# Patient Record
Sex: Female | Born: 1935 | Race: Black or African American | Hispanic: No | Smoking: Former smoker
Health system: Southern US, Community
[De-identification: ages and names within clinical notes are randomized; demographics above are authoritative.]

## PROBLEM LIST (undated history)

## (undated) DIAGNOSIS — Z87442 Personal history of urinary calculi: Secondary | ICD-10-CM

## (undated) DIAGNOSIS — Z86010 Personal history of colonic polyps: Secondary | ICD-10-CM

## (undated) DIAGNOSIS — N189 Chronic kidney disease, unspecified: Secondary | ICD-10-CM

## (undated) DIAGNOSIS — K219 Gastro-esophageal reflux disease without esophagitis: Secondary | ICD-10-CM

## (undated) DIAGNOSIS — M4800 Spinal stenosis, site unspecified: Secondary | ICD-10-CM

## (undated) DIAGNOSIS — R0989 Other specified symptoms and signs involving the circulatory and respiratory systems: Secondary | ICD-10-CM

## (undated) DIAGNOSIS — I739 Peripheral vascular disease, unspecified: Secondary | ICD-10-CM

## (undated) DIAGNOSIS — M25473 Effusion, unspecified ankle: Secondary | ICD-10-CM

## (undated) DIAGNOSIS — I2510 Atherosclerotic heart disease of native coronary artery without angina pectoris: Secondary | ICD-10-CM

## (undated) DIAGNOSIS — I4891 Unspecified atrial fibrillation: Secondary | ICD-10-CM

## (undated) DIAGNOSIS — K5790 Diverticulosis of intestine, part unspecified, without perforation or abscess without bleeding: Secondary | ICD-10-CM

## (undated) DIAGNOSIS — D649 Anemia, unspecified: Secondary | ICD-10-CM

## (undated) DIAGNOSIS — E209 Hypoparathyroidism, unspecified: Secondary | ICD-10-CM

## (undated) DIAGNOSIS — E7800 Pure hypercholesterolemia, unspecified: Secondary | ICD-10-CM

## (undated) DIAGNOSIS — I10 Essential (primary) hypertension: Secondary | ICD-10-CM

## (undated) DIAGNOSIS — Z9289 Personal history of other medical treatment: Secondary | ICD-10-CM

## (undated) HISTORY — DX: Other specified symptoms and signs involving the circulatory and respiratory systems: R09.89

## (undated) HISTORY — DX: Spinal stenosis, site unspecified: M48.00

## (undated) HISTORY — DX: Unspecified atrial fibrillation: I48.91

## (undated) HISTORY — PX: KIDNEY STONE SURGERY: SHX686

## (undated) HISTORY — DX: Atherosclerotic heart disease of native coronary artery without angina pectoris: I25.10

## (undated) HISTORY — DX: Effusion, unspecified ankle: M25.473

## (undated) HISTORY — PX: ABDOMINAL HYSTERECTOMY: SHX81

## (undated) HISTORY — PX: COLONOSCOPY: SHX174

## (undated) HISTORY — DX: Essential (primary) hypertension: I10

## (undated) HISTORY — DX: Gastro-esophageal reflux disease without esophagitis: K21.9

## (undated) HISTORY — DX: Diverticulosis of intestine, part unspecified, without perforation or abscess without bleeding: K57.90

## (undated) HISTORY — DX: Pure hypercholesterolemia, unspecified: E78.00

## (undated) HISTORY — PX: CATARACT EXTRACTION, BILATERAL: SHX1313

## (undated) HISTORY — DX: Personal history of colonic polyps: Z86.010

## (undated) HISTORY — DX: Chronic kidney disease, unspecified: N18.9

## (undated) HISTORY — DX: Hypoparathyroidism, unspecified: E20.9

## (undated) HISTORY — PX: POLYPECTOMY: SHX149

## (undated) HISTORY — DX: Anemia, unspecified: D64.9

## (undated) HISTORY — PX: APPENDECTOMY: SHX54

---

## 1998-12-03 ENCOUNTER — Emergency Department (HOSPITAL_COMMUNITY): Admission: EM | Admit: 1998-12-03 | Discharge: 1998-12-03 | Payer: Self-pay | Admitting: Emergency Medicine

## 2001-05-01 ENCOUNTER — Encounter: Payer: Self-pay | Admitting: Gastroenterology

## 2001-05-01 DIAGNOSIS — Z860100 Personal history of colon polyps, unspecified: Secondary | ICD-10-CM

## 2001-05-01 HISTORY — DX: Personal history of colonic polyps: Z86.010

## 2001-05-01 HISTORY — DX: Personal history of colon polyps, unspecified: Z86.0100

## 2002-07-31 ENCOUNTER — Encounter: Payer: Self-pay | Admitting: Internal Medicine

## 2002-07-31 ENCOUNTER — Encounter: Admission: RE | Admit: 2002-07-31 | Discharge: 2002-07-31 | Payer: Self-pay | Admitting: Internal Medicine

## 2002-12-31 ENCOUNTER — Encounter: Admission: RE | Admit: 2002-12-31 | Discharge: 2002-12-31 | Payer: Self-pay | Admitting: Cardiology

## 2002-12-31 ENCOUNTER — Encounter: Payer: Self-pay | Admitting: Cardiology

## 2003-01-04 ENCOUNTER — Ambulatory Visit (HOSPITAL_COMMUNITY): Admission: RE | Admit: 2003-01-04 | Discharge: 2003-01-04 | Payer: Self-pay | Admitting: Cardiology

## 2003-01-04 HISTORY — PX: CARDIOVERSION: SHX1299

## 2004-03-31 HISTORY — PX: TRANSTHORACIC ECHOCARDIOGRAM: SHX275

## 2004-06-19 ENCOUNTER — Ambulatory Visit: Payer: Self-pay | Admitting: Gastroenterology

## 2004-07-10 ENCOUNTER — Ambulatory Visit: Payer: Self-pay | Admitting: Gastroenterology

## 2008-02-08 ENCOUNTER — Inpatient Hospital Stay (HOSPITAL_COMMUNITY): Admission: EM | Admit: 2008-02-08 | Discharge: 2008-02-12 | Payer: Self-pay | Admitting: Emergency Medicine

## 2008-02-08 ENCOUNTER — Ambulatory Visit: Payer: Self-pay | Admitting: Internal Medicine

## 2008-04-08 ENCOUNTER — Encounter: Payer: Self-pay | Admitting: Gastroenterology

## 2008-07-15 ENCOUNTER — Encounter: Payer: Self-pay | Admitting: Gastroenterology

## 2008-08-08 DIAGNOSIS — I6529 Occlusion and stenosis of unspecified carotid artery: Secondary | ICD-10-CM | POA: Insufficient documentation

## 2008-08-08 DIAGNOSIS — Z87442 Personal history of urinary calculi: Secondary | ICD-10-CM | POA: Insufficient documentation

## 2008-08-08 DIAGNOSIS — Z860100 Personal history of colon polyps, unspecified: Secondary | ICD-10-CM | POA: Insufficient documentation

## 2008-08-08 DIAGNOSIS — M4800 Spinal stenosis, site unspecified: Secondary | ICD-10-CM | POA: Insufficient documentation

## 2008-08-08 DIAGNOSIS — I4891 Unspecified atrial fibrillation: Secondary | ICD-10-CM

## 2008-08-08 DIAGNOSIS — E785 Hyperlipidemia, unspecified: Secondary | ICD-10-CM

## 2008-08-08 DIAGNOSIS — I10 Essential (primary) hypertension: Secondary | ICD-10-CM | POA: Insufficient documentation

## 2008-08-08 DIAGNOSIS — Z862 Personal history of diseases of the blood and blood-forming organs and certain disorders involving the immune mechanism: Secondary | ICD-10-CM | POA: Insufficient documentation

## 2008-08-08 DIAGNOSIS — Z8639 Personal history of other endocrine, nutritional and metabolic disease: Secondary | ICD-10-CM

## 2008-08-08 DIAGNOSIS — K922 Gastrointestinal hemorrhage, unspecified: Secondary | ICD-10-CM | POA: Insufficient documentation

## 2008-08-08 DIAGNOSIS — K449 Diaphragmatic hernia without obstruction or gangrene: Secondary | ICD-10-CM | POA: Insufficient documentation

## 2008-08-08 DIAGNOSIS — M810 Age-related osteoporosis without current pathological fracture: Secondary | ICD-10-CM | POA: Insufficient documentation

## 2008-08-08 DIAGNOSIS — E559 Vitamin D deficiency, unspecified: Secondary | ICD-10-CM | POA: Insufficient documentation

## 2008-08-08 DIAGNOSIS — N289 Disorder of kidney and ureter, unspecified: Secondary | ICD-10-CM | POA: Insufficient documentation

## 2008-08-08 DIAGNOSIS — K219 Gastro-esophageal reflux disease without esophagitis: Secondary | ICD-10-CM

## 2008-08-08 DIAGNOSIS — K5730 Diverticulosis of large intestine without perforation or abscess without bleeding: Secondary | ICD-10-CM | POA: Insufficient documentation

## 2008-08-09 ENCOUNTER — Ambulatory Visit: Payer: Self-pay | Admitting: Gastroenterology

## 2008-08-09 DIAGNOSIS — R195 Other fecal abnormalities: Secondary | ICD-10-CM

## 2008-08-09 DIAGNOSIS — D649 Anemia, unspecified: Secondary | ICD-10-CM | POA: Insufficient documentation

## 2008-08-09 LAB — CONVERTED CEMR LAB
Basophils Relative: 0.9 % (ref 0.0–3.0)
Eosinophils Absolute: 0.1 10*3/uL (ref 0.0–0.7)
Eosinophils Relative: 2.4 % (ref 0.0–5.0)
Folate: >20 ng/mL
HCT: 28.5 % — ABNORMAL LOW (ref 36.0–46.0)
Hemoglobin: 9.6 g/dL — ABNORMAL LOW (ref 12.0–15.0)
MCV: 86.6 fL (ref 78.0–100.0)
Monocytes Absolute: 0.4 10*3/uL (ref 0.1–1.0)
Monocytes Relative: 7.9 % (ref 3.0–12.0)
Neutro Abs: 3 10*3/uL (ref 1.4–7.7)
Vitamin B-12: 532 pg/mL (ref 211–911)
WBC: 4.7 10*3/uL (ref 4.5–10.5)

## 2008-09-02 ENCOUNTER — Ambulatory Visit: Payer: Self-pay | Admitting: Gastroenterology

## 2008-09-02 ENCOUNTER — Encounter: Payer: Self-pay | Admitting: Gastroenterology

## 2008-09-04 ENCOUNTER — Encounter: Payer: Self-pay | Admitting: Gastroenterology

## 2008-11-18 ENCOUNTER — Telehealth: Payer: Self-pay | Admitting: Gastroenterology

## 2008-11-22 ENCOUNTER — Ambulatory Visit: Payer: Self-pay | Admitting: Hematology and Oncology

## 2008-11-27 ENCOUNTER — Encounter: Payer: Self-pay | Admitting: Gastroenterology

## 2008-11-28 ENCOUNTER — Encounter: Payer: Self-pay | Admitting: Gastroenterology

## 2008-11-28 LAB — CBC & DIFF AND RETIC
Basophils Absolute: 0.1 10*3/uL (ref 0.0–0.1)
Eosinophils Absolute: 0.1 10*3/uL (ref 0.0–0.5)
HCT: 28 % — ABNORMAL LOW (ref 34.8–46.6)
HGB: 9.1 g/dL — ABNORMAL LOW (ref 11.6–15.9)
MCH: 26.5 pg (ref 25.1–34.0)
MCV: 81.6 fL (ref 79.5–101.0)
NEUT#: 4.1 10*3/uL (ref 1.5–6.5)
NEUT%: 64.8 % (ref 38.4–76.8)
RDW: 16.2 % — ABNORMAL HIGH (ref 11.2–14.5)
Retic %: 3.67 % — ABNORMAL HIGH (ref 0.50–1.50)
Retic Ct Abs: 125.88 10*3/uL — ABNORMAL HIGH (ref 18.30–72.70)
lymph#: 1.3 10*3/uL (ref 0.9–3.3)

## 2008-11-28 LAB — MORPHOLOGY

## 2008-11-28 LAB — URINALYSIS, MICROSCOPIC - CHCC
Blood: NEGATIVE
Ketones: NEGATIVE mg/dL
Nitrite: NEGATIVE
Protein: <30 mg/dL
Specific Gravity, Urine: 1.015 (ref 1.003–1.035)

## 2008-12-03 LAB — DIRECT ANTIGLOBULIN TEST (NOT AT ARMC)
DAT (Complement): NEGATIVE
DAT IgG: NEGATIVE

## 2008-12-03 LAB — COMPREHENSIVE METABOLIC PANEL
ALT: 17 U/L (ref 0–35)
AST: 25 U/L (ref 0–37)
Albumin: 4.2 g/dL (ref 3.5–5.2)
Alkaline Phosphatase: 60 U/L (ref 39–117)
Calcium: 10.6 mg/dL — ABNORMAL HIGH (ref 8.4–10.5)
Chloride: 108 mEq/L (ref 96–112)
Potassium: 4.5 mEq/L (ref 3.5–5.3)
Sodium: 141 mEq/L (ref 135–145)
Total Protein: 7.2 g/dL (ref 6.0–8.3)

## 2008-12-03 LAB — PROTEIN ELECTROPHORESIS, SERUM, WITH REFLEX
Beta Globulin: 6.9 % (ref 4.7–7.2)
Total Protein, Serum Electrophoresis: 7.2 g/dL (ref 6.0–8.3)

## 2008-12-03 LAB — HAPTOGLOBIN: Haptoglobin: 187 mg/dL (ref 16–200)

## 2008-12-03 LAB — VITAMIN B12: Vitamin B-12: 1395 pg/mL — ABNORMAL HIGH (ref 211–911)

## 2008-12-09 LAB — HGB ELECTROPHORESIS
Hemoglobin Evaluation: ABNORMAL
Hgb Other: 0 %

## 2008-12-09 LAB — HEMOGLOBINOPATHY EVALUATION
Hgb A2 Quant: 1.6 % — ABNORMAL LOW (ref 2.2–3.2)
Hgb A: 67.6 % — ABNORMAL LOW (ref 96.8–97.8)
Hgb F Quant: 0 % (ref 0.0–2.0)

## 2008-12-13 ENCOUNTER — Encounter (INDEPENDENT_AMBULATORY_CARE_PROVIDER_SITE_OTHER): Payer: Self-pay | Admitting: Interventional Radiology

## 2008-12-13 ENCOUNTER — Ambulatory Visit (HOSPITAL_COMMUNITY): Admission: RE | Admit: 2008-12-13 | Discharge: 2008-12-13 | Payer: Self-pay | Admitting: Hematology and Oncology

## 2008-12-19 ENCOUNTER — Encounter: Payer: Self-pay | Admitting: Gastroenterology

## 2009-01-06 ENCOUNTER — Ambulatory Visit: Payer: Self-pay | Admitting: Hematology and Oncology

## 2009-01-08 LAB — CBC WITH DIFFERENTIAL/PLATELET
Eosinophils Absolute: 0.2 10*3/uL (ref 0.0–0.5)
HCT: 34.8 % (ref 34.8–46.6)
HGB: 11.8 g/dL (ref 11.6–15.9)
LYMPH%: 28.8 % (ref 14.0–49.7)
MONO#: 0.7 10*3/uL (ref 0.1–0.9)
NEUT#: 3.3 10*3/uL (ref 1.5–6.5)
Platelets: 153 10*3/uL (ref 145–400)
RBC: 4.52 10*6/uL (ref 3.70–5.45)
WBC: 5.9 10*3/uL (ref 3.9–10.3)
nRBC: 0 % (ref 0–0)

## 2009-01-29 LAB — CBC WITH DIFFERENTIAL/PLATELET
BASO%: 1.6 % (ref 0.0–2.0)
EOS%: 2.1 % (ref 0.0–7.0)
MCH: 27.4 pg (ref 25.1–34.0)
MCHC: 33.4 g/dL (ref 31.5–36.0)
MONO#: 0.9 10*3/uL (ref 0.1–0.9)
RDW: 15.9 % — ABNORMAL HIGH (ref 11.2–14.5)
WBC: 6.3 10*3/uL (ref 3.9–10.3)
lymph#: 1.5 10*3/uL (ref 0.9–3.3)

## 2009-02-17 ENCOUNTER — Ambulatory Visit: Payer: Self-pay | Admitting: Internal Medicine

## 2009-02-19 LAB — CBC WITH DIFFERENTIAL/PLATELET
Basophils Absolute: 0.1 10*3/uL (ref 0.0–0.1)
EOS%: 2.6 % (ref 0.0–7.0)
Eosinophils Absolute: 0.2 10*3/uL (ref 0.0–0.5)
HGB: 13 g/dL (ref 11.6–15.9)
MONO#: 0.9 10*3/uL (ref 0.1–0.9)
NEUT#: 3.3 10*3/uL (ref 1.5–6.5)
RDW: 14.8 % — ABNORMAL HIGH (ref 11.2–14.5)
WBC: 5.9 10*3/uL (ref 3.9–10.3)
lymph#: 1.5 10*3/uL (ref 0.9–3.3)

## 2009-03-03 ENCOUNTER — Encounter: Payer: Self-pay | Admitting: Gastroenterology

## 2009-03-12 LAB — CBC WITH DIFFERENTIAL/PLATELET
BASO%: 1.1 % (ref 0.0–2.0)
LYMPH%: 36.8 % (ref 14.0–49.7)
MCHC: 33.8 g/dL (ref 31.5–36.0)
MCV: 79.6 fL (ref 79.5–101.0)
MONO#: 0.7 10*3/uL (ref 0.1–0.9)
MONO%: 13.4 % (ref 0.0–14.0)
NEUT#: 2.6 10*3/uL (ref 1.5–6.5)
Platelets: 125 10*3/uL — ABNORMAL LOW (ref 145–400)
RBC: 4.57 10*6/uL (ref 3.70–5.45)
RDW: 14.3 % (ref 11.2–14.5)
WBC: 5.4 10*3/uL (ref 3.9–10.3)
nRBC: 0 % (ref 0–0)

## 2009-03-31 ENCOUNTER — Ambulatory Visit: Payer: Self-pay | Admitting: Internal Medicine

## 2009-04-02 ENCOUNTER — Encounter: Payer: Self-pay | Admitting: Gastroenterology

## 2009-04-02 LAB — CBC WITH DIFFERENTIAL/PLATELET
Basophils Absolute: 0.1 10*3/uL (ref 0.0–0.1)
Eosinophils Absolute: 0.1 10*3/uL (ref 0.0–0.5)
HCT: 36.1 % (ref 34.8–46.6)
HGB: 12.1 g/dL (ref 11.6–15.9)
LYMPH%: 26.7 % (ref 14.0–49.7)
MCV: 83.9 fL (ref 79.5–101.0)
MONO#: 0.7 10*3/uL (ref 0.1–0.9)
MONO%: 11.2 % (ref 0.0–14.0)
NEUT#: 3.6 10*3/uL (ref 1.5–6.5)
NEUT%: 59.2 % (ref 38.4–76.8)
Platelets: 150 10*3/uL (ref 145–400)
WBC: 6.1 10*3/uL (ref 3.9–10.3)

## 2009-04-03 LAB — BASIC METABOLIC PANEL
BUN: 28 mg/dL — ABNORMAL HIGH (ref 6–23)
CO2: 26 mEq/L (ref 19–32)
Calcium: 10.7 mg/dL — ABNORMAL HIGH (ref 8.4–10.5)
Glucose, Bld: 106 mg/dL — ABNORMAL HIGH (ref 70–99)
Sodium: 140 mEq/L (ref 135–145)

## 2009-04-03 LAB — FERRITIN: Ferritin: 150 ng/mL (ref 10–291)

## 2009-04-03 LAB — IRON AND TIBC: TIBC: 319 ug/dL (ref 250–470)

## 2009-04-03 LAB — IGG, IGA, IGM
IgG (Immunoglobin G), Serum: 1160 mg/dL (ref 694–1618)
IgM, Serum: 48 mg/dL — ABNORMAL LOW (ref 60–263)

## 2009-04-03 LAB — KAPPA/LAMBDA LIGHT CHAINS
Kappa:Lambda Ratio: 0.95 (ref 0.26–1.65)
Lambda Free Lght Chn: 1.55 mg/dL (ref 0.57–2.63)

## 2009-04-23 ENCOUNTER — Encounter (HOSPITAL_COMMUNITY): Admission: RE | Admit: 2009-04-23 | Discharge: 2009-05-29 | Payer: Self-pay | Admitting: Internal Medicine

## 2009-04-23 ENCOUNTER — Encounter: Payer: Self-pay | Admitting: Gastroenterology

## 2009-04-23 LAB — CBC WITH DIFFERENTIAL/PLATELET
BASO%: 0.8 % (ref 0.0–2.0)
EOS%: 1.2 % (ref 0.0–7.0)
HCT: 22.3 % — ABNORMAL LOW (ref 34.8–46.6)
LYMPH%: 18.6 % (ref 14.0–49.7)
MCH: 28 pg (ref 25.1–34.0)
MCHC: 32.4 g/dL (ref 31.5–36.0)
NEUT%: 68.9 % (ref 38.4–76.8)
Platelets: 208 10*3/uL (ref 145–400)
RBC: 2.58 10*6/uL — ABNORMAL LOW (ref 3.70–5.45)
lymph#: 1.5 10*3/uL (ref 0.9–3.3)

## 2009-04-23 LAB — TYPE & CROSSMATCH - CHCC

## 2009-04-23 LAB — IRON AND TIBC: Iron: 20 ug/dL — ABNORMAL LOW (ref 42–145)

## 2009-05-02 ENCOUNTER — Ambulatory Visit: Payer: Self-pay | Admitting: Gastroenterology

## 2009-05-02 DIAGNOSIS — N764 Abscess of vulva: Secondary | ICD-10-CM

## 2009-05-12 ENCOUNTER — Ambulatory Visit: Payer: Self-pay | Admitting: Internal Medicine

## 2009-05-14 LAB — CBC WITH DIFFERENTIAL/PLATELET
Basophils Absolute: 0 10*3/uL (ref 0.0–0.1)
EOS%: 0.3 % (ref 0.0–7.0)
Eosinophils Absolute: 0 10*3/uL (ref 0.0–0.5)
HCT: 38.7 % (ref 34.8–46.6)
HGB: 12.9 g/dL (ref 11.6–15.9)
MCH: 26.9 pg (ref 25.1–34.0)
NEUT#: 7.3 10*3/uL — ABNORMAL HIGH (ref 1.5–6.5)
NEUT%: 74.1 % (ref 38.4–76.8)
RDW: 15.8 % — ABNORMAL HIGH (ref 11.2–14.5)
lymph#: 1.2 10*3/uL (ref 0.9–3.3)

## 2009-05-16 ENCOUNTER — Ambulatory Visit: Payer: Self-pay | Admitting: Gastroenterology

## 2009-05-16 DIAGNOSIS — IMO0002 Reserved for concepts with insufficient information to code with codable children: Secondary | ICD-10-CM

## 2009-05-16 DIAGNOSIS — M1710 Unilateral primary osteoarthritis, unspecified knee: Secondary | ICD-10-CM | POA: Insufficient documentation

## 2009-06-04 LAB — CBC WITH DIFFERENTIAL/PLATELET
BASO%: 0.6 % (ref 0.0–2.0)
HCT: 27.8 % — ABNORMAL LOW (ref 34.8–46.6)
LYMPH%: 15.5 % (ref 14.0–49.7)
MCHC: 32.7 g/dL (ref 31.5–36.0)
MCV: 79.7 fL (ref 79.5–101.0)
MONO#: 1.2 10*3/uL — ABNORMAL HIGH (ref 0.1–0.9)
MONO%: 11.6 % (ref 0.0–14.0)
NEUT%: 70.2 % (ref 38.4–76.8)
Platelets: 249 10*3/uL (ref 145–400)
WBC: 10.2 10*3/uL (ref 3.9–10.3)

## 2009-06-24 ENCOUNTER — Telehealth: Payer: Self-pay | Admitting: Gastroenterology

## 2009-06-25 LAB — CONVERTED CEMR LAB
OCCULT 1: NEGATIVE
OCCULT 2: NEGATIVE
OCCULT 3: NEGATIVE

## 2009-07-03 ENCOUNTER — Encounter: Payer: Self-pay | Admitting: Gastroenterology

## 2009-07-03 ENCOUNTER — Ambulatory Visit: Payer: Self-pay | Admitting: Internal Medicine

## 2009-07-03 LAB — CBC WITH DIFFERENTIAL/PLATELET
Basophils Absolute: 0.1 10*3/uL (ref 0.0–0.1)
EOS%: 1.2 % (ref 0.0–7.0)
Eosinophils Absolute: 0.1 10*3/uL (ref 0.0–0.5)
HGB: 12.9 g/dL (ref 11.6–15.9)
MCH: 25.6 pg (ref 25.1–34.0)
MONO%: 13.1 % (ref 0.0–14.0)
NEUT#: 3.5 10*3/uL (ref 1.5–6.5)
RBC: 5.03 10*6/uL (ref 3.70–5.45)
RDW: 16.1 % — ABNORMAL HIGH (ref 11.2–14.5)
lymph#: 1.5 10*3/uL (ref 0.9–3.3)
nRBC: 0 % (ref 0–0)

## 2009-07-03 LAB — LACTATE DEHYDROGENASE: LDH: 181 U/L (ref 94–250)

## 2009-07-16 LAB — CBC WITH DIFFERENTIAL/PLATELET
BASO%: 1.4 % (ref 0.0–2.0)
EOS%: 2.2 % (ref 0.0–7.0)
HCT: 37.6 % (ref 34.8–46.6)
MCH: 25.7 pg (ref 25.1–34.0)
MCHC: 33.5 g/dL (ref 31.5–36.0)
MONO#: 0.7 10*3/uL (ref 0.1–0.9)
NEUT%: 55.2 % (ref 38.4–76.8)
RDW: 16.2 % — ABNORMAL HIGH (ref 11.2–14.5)
WBC: 5.8 10*3/uL (ref 3.9–10.3)
lymph#: 1.7 10*3/uL (ref 0.9–3.3)
nRBC: 0 % (ref 0–0)

## 2009-08-04 ENCOUNTER — Ambulatory Visit: Payer: Self-pay | Admitting: Internal Medicine

## 2009-08-06 LAB — CBC WITH DIFFERENTIAL/PLATELET
BASO%: 0.9 % (ref 0.0–2.0)
EOS%: 2 % (ref 0.0–7.0)
LYMPH%: 27.1 % (ref 14.0–49.7)
MCH: 25.9 pg (ref 25.1–34.0)
MCHC: 33.3 g/dL (ref 31.5–36.0)
MCV: 77.7 fL — ABNORMAL LOW (ref 79.5–101.0)
MONO#: 0.8 10*3/uL (ref 0.1–0.9)
MONO%: 11.6 % (ref 0.0–14.0)
NEUT%: 58.4 % (ref 38.4–76.8)
Platelets: 146 10*3/uL (ref 145–400)
RBC: 4.79 10*6/uL (ref 3.70–5.45)
WBC: 6.5 10*3/uL (ref 3.9–10.3)
nRBC: 0 % (ref 0–0)

## 2009-08-27 LAB — CBC WITH DIFFERENTIAL/PLATELET
BASO%: 0.8 % (ref 0.0–2.0)
EOS%: 1.6 % (ref 0.0–7.0)
MCH: 26.4 pg (ref 25.1–34.0)
MCHC: 33.6 g/dL (ref 31.5–36.0)
MCV: 78.6 fL — ABNORMAL LOW (ref 79.5–101.0)
MONO%: 12.7 % (ref 0.0–14.0)
RDW: 15.4 % — ABNORMAL HIGH (ref 11.2–14.5)
lymph#: 2 10*3/uL (ref 0.9–3.3)

## 2009-09-15 ENCOUNTER — Ambulatory Visit: Payer: Self-pay | Admitting: Internal Medicine

## 2009-09-17 LAB — CBC WITH DIFFERENTIAL/PLATELET
BASO%: 1.5 % (ref 0.0–2.0)
EOS%: 1.3 % (ref 0.0–7.0)
MCH: 27.1 pg (ref 25.1–34.0)
MCHC: 34.1 g/dL (ref 31.5–36.0)
RDW: 14.3 % (ref 11.2–14.5)
lymph#: 1.9 10*3/uL (ref 0.9–3.3)

## 2009-09-24 LAB — IRON AND TIBC
%SAT: 7 % — ABNORMAL LOW (ref 20–55)
TIBC: 317 ug/dL (ref 250–470)
UIBC: 296 ug/dL

## 2009-09-24 LAB — BASIC METABOLIC PANEL
CO2: 24 mEq/L (ref 19–32)
Calcium: 11 mg/dL — ABNORMAL HIGH (ref 8.4–10.5)
Sodium: 140 mEq/L (ref 135–145)

## 2009-09-24 LAB — CBC WITH DIFFERENTIAL/PLATELET
BASO%: 0.8 % (ref 0.0–2.0)
Eosinophils Absolute: 0.1 10*3/uL (ref 0.0–0.5)
MCV: 80.7 fL (ref 79.5–101.0)
MONO#: 1 10*3/uL — ABNORMAL HIGH (ref 0.1–0.9)
MONO%: 15.4 % — ABNORMAL HIGH (ref 0.0–14.0)
NEUT#: 3.3 10*3/uL (ref 1.5–6.5)
RBC: 3.47 10*6/uL — ABNORMAL LOW (ref 3.70–5.45)
RDW: 14.6 % — ABNORMAL HIGH (ref 11.2–14.5)
WBC: 6.3 10*3/uL (ref 3.9–10.3)
nRBC: 0 % (ref 0–0)

## 2009-10-01 ENCOUNTER — Encounter: Payer: Self-pay | Admitting: Gastroenterology

## 2009-10-08 LAB — CBC WITH DIFFERENTIAL/PLATELET
BASO%: 1.4 % (ref 0.0–2.0)
EOS%: 1.9 % (ref 0.0–7.0)
HCT: 31.5 % — ABNORMAL LOW (ref 34.8–46.6)
LYMPH%: 29.9 % (ref 14.0–49.7)
MCH: 25.2 pg (ref 25.1–34.0)
MCHC: 33 g/dL (ref 31.5–36.0)
NEUT%: 53 % (ref 38.4–76.8)
Platelets: 151 10*3/uL (ref 145–400)
lymph#: 1.7 10*3/uL (ref 0.9–3.3)

## 2009-10-28 ENCOUNTER — Ambulatory Visit: Payer: Self-pay | Admitting: Internal Medicine

## 2009-10-29 LAB — CBC WITH DIFFERENTIAL/PLATELET
Basophils Absolute: 0.1 10*3/uL (ref 0.0–0.1)
EOS%: 1.1 % (ref 0.0–7.0)
HCT: 33.7 % — ABNORMAL LOW (ref 34.8–46.6)
HGB: 11.1 g/dL — ABNORMAL LOW (ref 11.6–15.9)
LYMPH%: 28.6 % (ref 14.0–49.7)
MCH: 23.9 pg — ABNORMAL LOW (ref 25.1–34.0)
MCV: 72.6 fL — ABNORMAL LOW (ref 79.5–101.0)
MONO%: 11.2 % (ref 0.0–14.0)
NEUT%: 57.8 % (ref 38.4–76.8)
Platelets: 182 10*3/uL (ref 145–400)
lymph#: 1.6 10*3/uL (ref 0.9–3.3)

## 2009-11-19 LAB — CBC WITH DIFFERENTIAL/PLATELET
Basophils Absolute: 0.1 10*3/uL (ref 0.0–0.1)
EOS%: 1.3 % (ref 0.0–7.0)
HGB: 11.9 g/dL (ref 11.6–15.9)
MCH: 23.1 pg — ABNORMAL LOW (ref 25.1–34.0)
MCHC: 33.2 g/dL (ref 31.5–36.0)
MCV: 69.5 fL — ABNORMAL LOW (ref 79.5–101.0)
MONO%: 11.7 % (ref 0.0–14.0)
RDW: 18.8 % — ABNORMAL HIGH (ref 11.2–14.5)

## 2009-11-19 LAB — TECHNOLOGIST REVIEW

## 2009-12-08 ENCOUNTER — Ambulatory Visit: Payer: Self-pay | Admitting: Internal Medicine

## 2009-12-10 LAB — CBC WITH DIFFERENTIAL/PLATELET
Basophils Absolute: 0.1 10*3/uL (ref 0.0–0.1)
Eosinophils Absolute: 0.1 10*3/uL (ref 0.0–0.5)
HCT: 35.5 % (ref 34.8–46.6)
LYMPH%: 28.7 % (ref 14.0–49.7)
MCV: 68.4 fL — ABNORMAL LOW (ref 79.5–101.0)
MONO#: 0.8 10*3/uL (ref 0.1–0.9)
MONO%: 15.1 % — ABNORMAL HIGH (ref 0.0–14.0)
NEUT#: 2.8 10*3/uL (ref 1.5–6.5)
NEUT%: 51.4 % (ref 38.4–76.8)
Platelets: 159 10*3/uL (ref 145–400)
WBC: 5.4 10*3/uL (ref 3.9–10.3)

## 2009-12-15 ENCOUNTER — Encounter: Payer: Self-pay | Admitting: Gastroenterology

## 2010-01-19 ENCOUNTER — Ambulatory Visit: Payer: Self-pay | Admitting: Internal Medicine

## 2010-01-21 LAB — CBC WITH DIFFERENTIAL/PLATELET
Basophils Absolute: 0.1 10*3/uL (ref 0.0–0.1)
EOS%: 1.3 % (ref 0.0–7.0)
MCH: 24.2 pg — ABNORMAL LOW (ref 25.1–34.0)
MCV: 71.8 fL — ABNORMAL LOW (ref 79.5–101.0)
MONO%: 13.9 % (ref 0.0–14.0)
RBC: 4.76 10*6/uL (ref 3.70–5.45)
RDW: 23 % — ABNORMAL HIGH (ref 11.2–14.5)
nRBC: 0 % (ref 0–0)

## 2010-01-21 LAB — COMPREHENSIVE METABOLIC PANEL
Alkaline Phosphatase: 62 U/L (ref 39–117)
Glucose, Bld: 96 mg/dL (ref 70–99)
Sodium: 140 mEq/L (ref 135–145)
Total Bilirubin: 1.4 mg/dL — ABNORMAL HIGH (ref 0.3–1.2)
Total Protein: 6.6 g/dL (ref 6.0–8.3)

## 2010-01-21 LAB — IRON AND TIBC
%SAT: 36 % (ref 20–55)
TIBC: 374 ug/dL (ref 250–470)
UIBC: 238 ug/dL

## 2010-02-23 ENCOUNTER — Ambulatory Visit: Payer: Self-pay | Admitting: Internal Medicine

## 2010-02-25 ENCOUNTER — Encounter: Payer: Self-pay | Admitting: Gastroenterology

## 2010-02-25 LAB — CBC WITH DIFFERENTIAL/PLATELET
BASO%: 1.3 % (ref 0.0–2.0)
EOS%: 1.3 % (ref 0.0–7.0)
HCT: 34.3 % — ABNORMAL LOW (ref 34.8–46.6)
LYMPH%: 31.5 % (ref 14.0–49.7)
MCH: 24.6 pg — ABNORMAL LOW (ref 25.1–34.0)
MCHC: 33.8 g/dL (ref 31.5–36.0)
NEUT%: 53.9 % (ref 38.4–76.8)
Platelets: 145 10*3/uL (ref 145–400)

## 2010-04-29 ENCOUNTER — Other Ambulatory Visit: Payer: Self-pay | Admitting: Hematology and Oncology

## 2010-04-29 ENCOUNTER — Ambulatory Visit (HOSPITAL_COMMUNITY)
Admission: RE | Admit: 2010-04-29 | Discharge: 2010-04-29 | Payer: Self-pay | Source: Home / Self Care | Admitting: Internal Medicine

## 2010-04-29 LAB — CBC WITH DIFFERENTIAL/PLATELET
Basophils Absolute: 0.1 10*3/uL (ref 0.0–0.1)
Eosinophils Absolute: 0.1 10*3/uL (ref 0.0–0.5)
HGB: 12.7 g/dL (ref 11.6–15.9)
MONO#: 0.9 10*3/uL (ref 0.1–0.9)
NEUT#: 3.9 10*3/uL (ref 1.5–6.5)
Platelets: 149 10*3/uL (ref 145–400)
RBC: 4.89 10*6/uL (ref 3.70–5.45)
RDW: 16.5 % — ABNORMAL HIGH (ref 11.2–14.5)
WBC: 7.4 10*3/uL (ref 3.9–10.3)
nRBC: 0 % (ref 0–0)

## 2010-05-19 ENCOUNTER — Ambulatory Visit (HOSPITAL_BASED_OUTPATIENT_CLINIC_OR_DEPARTMENT_OTHER): Payer: MEDICARE | Admitting: Internal Medicine

## 2010-05-20 LAB — CBC WITH DIFFERENTIAL/PLATELET
Basophils Absolute: 0.1 10*3/uL (ref 0.0–0.1)
Eosinophils Absolute: 0.1 10*3/uL (ref 0.0–0.5)
HGB: 12.1 g/dL (ref 11.6–15.9)
MONO#: 0.9 10*3/uL (ref 0.1–0.9)
MONO%: 11.9 % (ref 0.0–14.0)
NEUT#: 4.3 10*3/uL (ref 1.5–6.5)
RBC: 4.58 10*6/uL (ref 3.70–5.45)
RDW: 15.8 % — ABNORMAL HIGH (ref 11.2–14.5)
WBC: 7.2 10*3/uL (ref 3.9–10.3)
lymph#: 1.9 10*3/uL (ref 0.9–3.3)
nRBC: 0 % (ref 0–0)

## 2010-06-10 LAB — CBC WITH DIFFERENTIAL/PLATELET
BASO%: 1.1 % (ref 0.0–2.0)
Basophils Absolute: 0.1 10*3/uL (ref 0.0–0.1)
EOS%: 1.6 % (ref 0.0–7.0)
Eosinophils Absolute: 0.1 10*3/uL (ref 0.0–0.5)
HCT: 37.6 % (ref 34.8–46.6)
HGB: 12.9 g/dL (ref 11.6–15.9)
LYMPH%: 30 % (ref 14.0–49.7)
MCH: 26.4 pg (ref 25.1–34.0)
MCHC: 34.3 g/dL (ref 31.5–36.0)
MCV: 76.9 fL — ABNORMAL LOW (ref 79.5–101.0)
MONO#: 0.6 10*3/uL (ref 0.1–0.9)
MONO%: 11.1 % (ref 0.0–14.0)
NEUT#: 3.2 10*3/uL (ref 1.5–6.5)
NEUT%: 56.2 % (ref 38.4–76.8)
Platelets: 177 10*3/uL (ref 145–400)
RBC: 4.89 10*6/uL (ref 3.70–5.45)
RDW: 15.7 % — ABNORMAL HIGH (ref 11.2–14.5)
WBC: 5.6 10*3/uL (ref 3.9–10.3)
lymph#: 1.7 10*3/uL (ref 0.9–3.3)
nRBC: 0 % (ref 0–0)

## 2010-06-10 LAB — FERRITIN: Ferritin: 43 ng/mL (ref 10–291)

## 2010-06-10 LAB — BASIC METABOLIC PANEL
BUN: 23 mg/dL (ref 6–23)
CO2: 23 mEq/L (ref 19–32)
Calcium: 11.6 mg/dL — ABNORMAL HIGH (ref 8.4–10.5)
Chloride: 104 mEq/L (ref 96–112)
Creatinine, Ser: 1.31 mg/dL — ABNORMAL HIGH (ref 0.40–1.20)
Glucose, Bld: 91 mg/dL (ref 70–99)
Potassium: 4.5 mEq/L (ref 3.5–5.3)
Sodium: 140 mEq/L (ref 135–145)

## 2010-06-10 LAB — IRON AND TIBC
%SAT: 21 % (ref 20–55)
Iron: 69 ug/dL (ref 42–145)
TIBC: 322 ug/dL (ref 250–470)
UIBC: 253 ug/dL

## 2010-06-30 NOTE — Progress Notes (Signed)
Summary: Protoix  Phone Note Call from Patient Call back at Home Phone [phone]   Caller: Patient Call For: Dr. Sharlett Iles Reason for Call: Talk to Nurse Summary of Call: pt wants to know if there is a drug similar to Protonix, but affordable Initial call taken by: Lucien Mons,  June 24, 2009 11:09 AM  Follow-up for Phone Call        No answer. Butch Penny Surface RN  June 24, 2009 11:23 AM  talked with pt.  Pt states she han on insurance coverage for meds at the present time.  She will check with pharmacist re cost of generic protonix,  If this is too much pt will try OTC omeprazole. Follow-up by: Alberteen Spindle RN,  June 24, 2009 11:59 AM

## 2010-06-30 NOTE — Letter (Signed)
Summary: [name] MD  [name] MD   Imported By: Phillis Knack 12/31/2009 09:41:27  _____________________________________________________________________  External Attachment:    Type:   Image     Comment:   External Document

## 2010-06-30 NOTE — Letter (Signed)
Summary: [place]   Imported By: Phillis Knack 07/10/2009 10:37:42  _____________________________________________________________________  External Attachment:    Type:   Image     Comment:   External Document

## 2010-06-30 NOTE — Letter (Signed)
Summary: Pam [name]   Imported By: Bubba Hales 07/19/2009 11:43:11  _____________________________________________________________________  External Attachment:    Type:   Image     Comment:   External Document

## 2010-06-30 NOTE — Letter (Signed)
Summary: [place]   Imported By: Rise Patience 10/17/2009 16:05:20  _____________________________________________________________________  External Attachment:    Type:   Image     Comment:   External Document

## 2010-06-30 NOTE — Letter (Signed)
Summary: [place]   Imported By: Phillis Knack 03/17/2010 [id number]  _____________________________________________________________________  External Attachment:    Type:   Image     Comment:   External Document

## 2010-07-01 ENCOUNTER — Ambulatory Visit: Payer: MEDICARE | Admitting: Hematology and Oncology

## 2010-07-01 DIAGNOSIS — D649 Anemia, unspecified: Secondary | ICD-10-CM

## 2010-07-01 LAB — CBC WITH DIFFERENTIAL/PLATELET
Basophils Absolute: 0.1 10*3/uL (ref 0.0–0.1)
EOS%: 2.4 % (ref 0.0–7.0)
HCT: 35.4 % (ref 34.8–46.6)
HGB: 12 g/dL (ref 11.6–15.9)
MCH: 26.1 pg (ref 25.1–34.0)
MCV: 77.1 fL — ABNORMAL LOW (ref 79.5–101.0)
MONO%: 15.5 % — ABNORMAL HIGH (ref 0.0–14.0)
NEUT%: 45.8 % (ref 38.4–76.8)
RDW: 15 % — ABNORMAL HIGH (ref 11.2–14.5)

## 2010-07-22 ENCOUNTER — Encounter (HOSPITAL_BASED_OUTPATIENT_CLINIC_OR_DEPARTMENT_OTHER): Payer: MEDICARE | Admitting: Hematology and Oncology

## 2010-07-22 ENCOUNTER — Other Ambulatory Visit: Payer: Self-pay | Admitting: Hematology and Oncology

## 2010-07-22 DIAGNOSIS — D638 Anemia in other chronic diseases classified elsewhere: Secondary | ICD-10-CM

## 2010-07-22 DIAGNOSIS — I10 Essential (primary) hypertension: Secondary | ICD-10-CM

## 2010-07-22 DIAGNOSIS — N189 Chronic kidney disease, unspecified: Secondary | ICD-10-CM

## 2010-07-22 DIAGNOSIS — D649 Anemia, unspecified: Secondary | ICD-10-CM

## 2010-07-22 DIAGNOSIS — M810 Age-related osteoporosis without current pathological fracture: Secondary | ICD-10-CM

## 2010-07-22 LAB — CBC WITH DIFFERENTIAL/PLATELET
BASO%: 1.6 % (ref 0.0–2.0)
Eosinophils Absolute: 0.1 10*3/uL (ref 0.0–0.5)
HCT: 29.1 % — ABNORMAL LOW (ref 34.8–46.6)
HGB: 9.8 g/dL — ABNORMAL LOW (ref 11.6–15.9)
MCHC: 33.7 g/dL (ref 31.5–36.0)
MONO#: 1.3 10*3/uL — ABNORMAL HIGH (ref 0.1–0.9)
NEUT#: 4.7 10*3/uL (ref 1.5–6.5)
NEUT%: 53.8 % (ref 38.4–76.8)
Platelets: 212 10*3/uL (ref 145–400)
WBC: 8.7 10*3/uL (ref 3.9–10.3)
lymph#: 2.5 10*3/uL (ref 0.9–3.3)
nRBC: 0 % (ref 0–0)

## 2010-08-12 ENCOUNTER — Encounter (HOSPITAL_BASED_OUTPATIENT_CLINIC_OR_DEPARTMENT_OTHER): Payer: MEDICARE | Admitting: Hematology and Oncology

## 2010-08-12 ENCOUNTER — Other Ambulatory Visit: Payer: Self-pay | Admitting: Hematology and Oncology

## 2010-08-12 DIAGNOSIS — D509 Iron deficiency anemia, unspecified: Secondary | ICD-10-CM

## 2010-08-12 DIAGNOSIS — D638 Anemia in other chronic diseases classified elsewhere: Secondary | ICD-10-CM

## 2010-08-12 DIAGNOSIS — D649 Anemia, unspecified: Secondary | ICD-10-CM

## 2010-08-12 DIAGNOSIS — N189 Chronic kidney disease, unspecified: Secondary | ICD-10-CM

## 2010-08-12 DIAGNOSIS — I10 Essential (primary) hypertension: Secondary | ICD-10-CM

## 2010-08-12 LAB — CBC WITH DIFFERENTIAL/PLATELET
Basophils Absolute: 0.1 10*3/uL (ref 0.0–0.1)
Eosinophils Absolute: 0.1 10*3/uL (ref 0.0–0.5)
HCT: 34.5 % — ABNORMAL LOW (ref 34.8–46.6)
HGB: 11.5 g/dL — ABNORMAL LOW (ref 11.6–15.9)
MONO#: 0.9 10*3/uL (ref 0.1–0.9)
NEUT%: 52.8 % (ref 38.4–76.8)
WBC: 6 10*3/uL (ref 3.9–10.3)
lymph#: 1.7 10*3/uL (ref 0.9–3.3)

## 2010-09-02 LAB — CROSSMATCH: Antibody Screen: NEGATIVE

## 2010-09-06 LAB — PROTIME-INR
INR: 1 (ref 0.00–1.49)
Prothrombin Time: 13.3 seconds (ref 11.6–15.2)

## 2010-09-06 LAB — CBC
HCT: 34.3 % — ABNORMAL LOW (ref 36.0–46.0)
Hemoglobin: 11.3 g/dL — ABNORMAL LOW (ref 12.0–15.0)
Platelets: 201 10*3/uL (ref 150–400)
WBC: 5.9 10*3/uL (ref 4.0–10.5)

## 2010-09-09 ENCOUNTER — Other Ambulatory Visit: Payer: Self-pay | Admitting: Hematology and Oncology

## 2010-09-09 ENCOUNTER — Encounter (HOSPITAL_BASED_OUTPATIENT_CLINIC_OR_DEPARTMENT_OTHER): Payer: MEDICARE | Admitting: Hematology and Oncology

## 2010-09-09 DIAGNOSIS — D649 Anemia, unspecified: Secondary | ICD-10-CM

## 2010-09-09 LAB — CBC WITH DIFFERENTIAL/PLATELET
BASO%: 1.2 % (ref 0.0–2.0)
EOS%: 2 % (ref 0.0–7.0)
HCT: 33.7 % — ABNORMAL LOW (ref 34.8–46.6)
LYMPH%: 31.8 % (ref 14.0–49.7)
MCH: 26.3 pg (ref 25.1–34.0)
MCHC: 34.4 g/dL (ref 31.5–36.0)
MONO%: 13 % (ref 0.0–14.0)
NEUT%: 52 % (ref 38.4–76.8)
Platelets: 152 10*3/uL (ref 145–400)

## 2010-10-07 ENCOUNTER — Other Ambulatory Visit: Payer: Self-pay | Admitting: Hematology and Oncology

## 2010-10-07 ENCOUNTER — Encounter (HOSPITAL_BASED_OUTPATIENT_CLINIC_OR_DEPARTMENT_OTHER): Payer: MEDICARE | Admitting: Hematology and Oncology

## 2010-10-07 DIAGNOSIS — D649 Anemia, unspecified: Secondary | ICD-10-CM

## 2010-10-07 LAB — CBC WITH DIFFERENTIAL/PLATELET
Basophils Absolute: 0 10*3/uL (ref 0.0–0.1)
EOS%: 1.5 % (ref 0.0–7.0)
MCH: 26.4 pg (ref 25.1–34.0)
MCV: 77.1 fL — ABNORMAL LOW (ref 79.5–101.0)
MONO%: 12.4 % (ref 0.0–14.0)
RBC: 4.24 10*6/uL (ref 3.70–5.45)
RDW: 14.8 % — ABNORMAL HIGH (ref 11.2–14.5)
nRBC: 0 % (ref 0–0)

## 2010-10-13 NOTE — H&P (Signed)
NAME:  Pam [name], BADGER NO.:  [id number]   MEDICAL RECORD NO.:  [id number]          PATIENT TYPE:  EMS   LOCATION:  ED                           FACILITY:  [facility]   PHYSICIAN:  Rexene Alberts, M.D.    DATE OF BIRTH:  [birth year]   DATE OF ADMISSION:  02/08/2008  DATE OF DISCHARGE:                              HISTORY & PHYSICAL   PRIMARY CARE PHYSICIAN:  Dr. [name].   PRIMARY CARDIOLOGIST:  Dr. Chase Picket.   PRIMARY GASTROENTEROLOGIST:  Dr. Sharlett Iles.   CHIEF COMPLAINT:  Generalized weakness and dizziness.   HISTORY OF PRESENT ILLNESS:  The patient is a 77 year old woman with a  past medical history significant for chronic atrial fibrillation,  hypertension, and kidney stones, who presents to the emergency  department with a chief complaint of generalized weakness and dizziness.  The patient states that she started feeling weakly approximately 5 days  ago.  She equates the onset of her symptoms with eating out at a  restaurant.  Approximately 2 hours after she ate a sandwich at the  restaurant 5 days ago, she had 1 episode of vomiting and 1 episode of  diarrhea.  She denies coffee-ground emesis.  However, her stools were  particularly black and loose.  Since that time, she has had black stools  intermittently.  She has had no further vomiting.  She denies abdominal  pain.  As she was trying to get up today to go look out of the window at  home, she became so dizzy that she fell.  She fell on her left side  without hitting her head.  She denies total blackout.  She says that she  just became dizzy and lost her balance and fell.  There was no loss of  consciousness.  She also denies headache, chest pain, shortness of  breath, and palpitations.   She did become alarmed about the black stools, but waited until today to  be evaluated.  She does have a history of bright red blood per rectum  dating back to the late 1990s, early 2000.  At that time, she was  evaluated by a gastroenterologist, Dr. Sharlett Iles.  Apparently Dr.  Sharlett Iles performed a colonoscopy in 2002 and it revealed diverticulosis  and polyps.  She was advised by Dr. Sharlett Iles to stop aspirin at that  time.  Since then, she has had no further GI bleeding until 5 days ago.  The patient is treated with chronic Coumadin.  She specifically denies  taking any NSAIDS over the past few weeks.  She does admit drinking  bourbon at least 3 to 4 days weekly.   During the evaluation in the emergency department, the patient is noted  to be afebrile and hemodynamically stable.  Her lab data are significant  for a hemoglobin of 5.4 and an INR of 3.  Because of the fall, the  emergency department physician, Dr. Winfred Leeds, ordered a CT scan of the  abdomen and pelvis to rule out a hematoma.  The results were essentially  negative for a hematoma.  The patient will  be admitted for further  evaluation and management.   PAST MEDICAL HISTORY:  1. Chronic atrial fibrillation, on chronic Coumadin therapy.  2. Status post cardioversion in August 2004 by Dr. [name].  3. Hypertension.  4. Tobacco use.  5. History of kidney stones.  6. Status post hysterectomy secondary to dysfunctional uterine      bleeding several decades ago.  7. History of GI bleed in 2002.  Colonoscopy by Dr. Sharlett Iles revealed      diverticulosis and colon polyps.  8. Hyperlipidemia.  9. Alcohol use.   MEDICATIONS:  1. Coumadin 6 mg daily.  2. Diltiazem 60 mg b.i.d.  3. Lanoxin 0.125 mg daily.  4. Lisinopril hydrochlorothiazide 20/25 mg daily.  5. Simvastatin, question dose, once daily.  6. Aspirin 81 mg daily.   ALLERGIES:  No known drug allergies.   SOCIAL HISTORY:  The patient is married.  She lives in [place], [place], with her husband.  She has no children.  She is a retired  Statistician.  She smokes approximately half a pack of cigarettes per  day and she has been doing so for 50+ years.  She  drinks bourbon at  least 1 drink 3 to 4 times per week.  She denies illicit drug use.  She  is still independent and drives.   FAMILY HISTORY:  Her mother died of heart disease and her father died of  Parkinson's disease.   PHYSICAL EXAMINATION:  VITAL SIGNS:  Temperature 97.3, blood pressure  110/50, pulse 65, respiratory rate 18, oxygen saturation 100% on 2L of  nasal cannula oxygen.  GENERAL:  The patient is a pleasant alert 77 year old African American  woman who is currently lying in bed in no acute distress.  HEENT:  Head is normocephalic nontraumatic.  Pupils equal, round,  reactive to light.  Extraocular muscles are intact.  Conjunctivae are  pale.  Sclerae are white.  Nasal mucosa is mildly dry.  No sinus  tenderness.  Oropharynx reveals mildly dry mucous membranes.  A full set  of dentures are present.  Her mucous membranes show pallor; no exudates  or erythema.  NECK:  Supple.  No adenopathy, no thyromegaly, no bruit or JVD.  LUNGS:  Decreased breath sounds in the bases, otherwise clear.  HEART:  Irregularly irregular.  ABDOMEN:  Positive bowel sounds, soft, nontender, nondistended.  No  hepatosplenomegaly, no masses palpated.  RECTAL:  Per the emergency department physician, the patient's stool was  melanotic.  GU:  Deferred.  EXTREMITIES:  Pedal pulses barely palpable.  No pretibial edema and no  pedal edema.  NEUROLOGIC:  The patient is alert and oriented x3.  Cranial nerves II-  XII are intact.  Strength is 5/5 throughout.  Sensation is intact.   ADMISSION LABORATORY:  WBC 12.9, hemoglobin 5.4, hematocrit 16.2, MCV  89.7, platelets 155,000.  Stool, guaiac positive.  PT 33.4, INR 3.  Urinalysis, essentially negative.   CT scan of the abdomen and pelvis.  The results revealed negative for a  retroperitoneal hematoma or other acute abdominal process.  Bilateral  renal masses, possibly cysts, but incompletely evaluated.  Coronary  aortic and mitral annulus  calcifications.  Lumbar scoliosis with  multilevel degenerative changes.  Negative for acute pelvic process.  Extensive pelvic arterial calcifications.   ASSESSMENT:  1. Dizziness and generalized weakness, secondary to profound anemia.  2. Profound anemia, secondary to subacute to chronic gastrointestinal      bleeding; suspect upper gastrointestinal bleeding.  3. Coagulopathy secondary  to Coumadin.  4. Chronic atrial fibrillation with chronic Coumadin therapy.  5. Mild leukocytosis.  More than likely, the leukocytosis is reactive.  6. Hypertension.  The patient's blood pressure is relatively normal.  7. Tobacco and alcohol use.   PLAN:  1. The patient will be admitted to the ICU.  2. We will type and cross for a total of 4 units of packed red blood      cells and transfuse 3 units slowly today.  3. Will hold Coumadin and aspirin.  4. Will give 1 unit of fresh frozen plasma.  5. Will consult gastroenterologist Dr. Sharlett Iles or colleagues.  6. The patient's PT, PTT, and hemoglobin and hematocrit will be      followed closely.  7. Will start intravenous Protonix every 12 hours.  8. Will start a clear liquid diet for now and modify per the      recommendations of gastroenterology.  9. Will check a stat digoxin level.  10.Will check iron studies only if the studies can be added to the      blood collected prior to the transfusion.  11.Tobacco cessation counseling.  12.Vitamin therapy with thiamine once daily. Consider p.r.n. Ativan.  13.Will ask the IV team to place a PICC line today.      Rexene Alberts, M.D.  Electronically Signed     DF/MEDQ  D:  02/08/2008  T:  02/08/2008  Job:  [id number]   cc:   [name], M.D.  Fax: Arkoe Little, M.D.  Fax: [place]. Sharlett Iles, MD, FACG, FACP, FAGA  [address]  [place] [address]

## 2010-10-13 NOTE — Discharge Summary (Signed)
NAMEJAHNAI, Pam [name]                  ACCOUNT NO.:  [id number]   MEDICAL RECORD NO.:  [id number]          PATIENT TYPE:  INP   LOCATION:  1403                         FACILITY:  [facility]   PHYSICIAN:  Durwin Nora, MDDATE OF BIRTH:  06-Apr-1926   DATE OF ADMISSION:  02/08/2008  DATE OF DISCHARGE:  02/12/2008                               DISCHARGE SUMMARY   DISCHARGE DIAGNOSES:  1. Upper gastrointestinal bleed.  2. Acute blood loss anemia.  3. Coagulopathy, reversed.  4. Atrial fibrillation, stable.  5. History of diverticulosis.  6. Colon polyps.  7. History of gastrointestinal bleed.  8. History of tobacco and alcohol abuse.  9. History of kidney stones.  10.Hyperlipidemia  11.Thrombocytopenia.  12.Hyperlipidemia.  13.Mild renal insufficiency.  14.Hiatal hernia.   CONSULTS:  Dr. Delfin Edis, gastroenterology, [place].  Consult with [facility] Cardiologist   PROCEDURES:  Esophagogastroduodenoscopy February 09, 2008 showed acute  gastritis.   HOSPITAL COURSE:  This 77 year old African American lady presented with  generalized weakness of 5-days' duration.  The patient also noted  melenic stools.  The patient is on chronic Coumadin treatment for atrial  fibrillation status post cardioversion . The patient was profoundly  anemic.  Her INR was elevated at 3.  The Coumadin was reversed with  vitamin K. She was started on IV Protonix. EGD on February 09, 2008,  which showed worsening gastritis and a large hiatal hernia.  GI holding  off on coumadin for now.  The risks and benefits of using Coumadin long  term will be determined as an outpatient after outpatient colonoscopy .  She had episodes  of asymptomatic pauses by February 11, 2008.  The  plan is, if this pause is lengthy, her digoxin will be discontinued.  Digoxin level on this admission  is normal at 1.1.  The patient has been  chronically constipated and she is being given laxatives and stool  softeners.   Noticed to be having low grade fever on February 11, 2008  and with a white count of 13,000.  The urine was sent for culture and  she was started on IV Rocephin.  She has remained afebrile.  White count  has been stable.  Hematocrit has also been stable.   PLAN:  She is to be discharged home.   DISCHARGE CONDITION:  Stable.   DIET:  To be low sodium, heart healthy, low cholesterol.   ACTIVITY:  To be increased slowly as needed.   FOLLOWUP:  Follow up with family care physician, Dr. Mellody Drown, tomorrow  February 13, 2008 to have a repeat CBC and BNP  at that time.  PCP will  also follow up  culture results and sensitivity primary care in  conjunction with  Cardiology to determine restarting Coumadin as an  outpatient.  Also GI will review possible colonoscopy as an outpatient.  Follow up with gastroenterologist in the next 1 to 2 weeks.  Follow up  with cardiologist in 4 to 6 weeks as needed.   MEDICATIONS ON DISCHARGE:  1. Colace 100 mg b.i.d.  2. MiraLax 17 g daily as needed.  3. Hydrochlorothiazide 12.5 mg daily.  4. Lisinopril 10 mg daily.  5. Protonix 40 mg twice daily.  6. Keflex500mg  q.i.d. for the next 5 days.  7. Tylenol 650 mg q.6 hours p.r.n.  8. Diltiazem 120 mg nightly.  9. Enteric coated aspirin 81 mg daily.  This is to be held for      symptoms of GI bleed, i.e., melena, hematemesis, or increasing      abdominal pain.   EXAMINATION:  CONSTITUTIONAL:  Today, she is an elderly lady not in  acute distress.  VITAL SIGNS:  Temperature 99, pulse 80, respiratory rate is 16, blood  pressure 130/82.  SKIN:  Pale, not jaundiced.  HEENT:  Normocephalic, atraumatic.  Mucous membranes are moist.  Oropharynx /Nasopharynx clear.  NECK:  Supple.  No adenopathy.  No jugular venous distension.  Mildly  dehydrated.  CHEST:  Clinically clear.  No rales or rhonchi.  HEART:  Sounds 1 and 2 with 2/6 systolic murmur, regular.  ABDOMEN:  Soft, nontender, no organomegaly.  Bowel  sounds are sluggish,  but present.  NEUROLOGIC:  She is alert and oriented to time, place, and person.  EXTREMITIES: peripheral pulses, but there is no pedal edema.   LABS:  WBCs 13, platelet 139.  H. pylori and Urease test negative .  Sodium 137, potassium 4.0, chloride 108, bicarbonate 22, BUN 20,  creatinine 1.33, glucose 86.  Chest x-ray on February 10, 2008 shows  minimal bibasilar atelectasis.  CT abdomen and pelvis of February 08, 2008 was negative for retroperitoneal hematoma.  She has bilateral renal  cysts.  Pulmonary, aortic, and mitral calcification,lumbar scoliosis.  CT pelvis, negative.   The patient's medications, treatment, plan of discharge are discussed  with her and her husband, and they verbalized understanding.   TIME OF DISCHARGE:  Greater than 20 minutes.      Durwin Nora, MD  Electronically Signed    MIO/MEDQ  D:  02/12/2008  T:  02/12/2008  Job:  [id number]   cc:   Lauraine Rinne, MD

## 2010-10-16 NOTE — Op Note (Signed)
NAMEPREMA, NUNNERY NO.:  [id number]   MEDICAL RECORD NO.:  [id number]          PATIENT TYPE:  INP   LOCATION:  [place]                         FACILITY:  [facility]   PHYSICIAN:  Lowella Bandy. Olevia Perches, MD     DATE OF BIRTH:  [birth year]   DATE OF PROCEDURE:  DATE OF DISCHARGE:  02/12/2008                               OPERATIVE REPORT   PROCEDURE:  Upper endoscopy.   INDICATIONS:  This 77 year old black female presented with a suspected  upper GI bleed, severe anemia, elevated BUN, and mild coagulopathy with  INR of 1.5.  She is undergoing upper endoscopy after blood transfusions  and vitamin K.   ENDOSCOPE:  Olympus single channell videoscope.   SEDATION:  Versed 2.5 mg IV, fentanyl 25 mcg IV.   FINDINGS:  Olympus single channel  videoscope passed  through the  posterior pharynx into the esophagus without difficulty. Patient was  monitored by pulse oximeter.  Oxygen saturations were normal.  The  proximal and distal esophageal mucosa was unremarkable, there was no  esophagitis.  There were no esophageal varices, and there was no blood  in the esophageal lumen.   STOMACH:  The stomach was insufflated with air and showed erosive  gastritis in the gastric antrum with multiple erosions in the  prewpyloric antrum.  Biopsies were taken for H. pylori test.  There was  no active bleeding in the stomach and no old blood retained along the  gastric wall.   DUODENUM:  The duodenal bulb and the descending duodenum was normal.   IMPRESSION:  Erosive gastritis gastric  the antrum, status post CLO  test.  No active bleeding.  No evidence of esophageal varices.   PLAN:  Patient will be treated with holding her Coumadin, proton pump  inhibitor, and bowel rest.  Will also reconsider using aspirin in the  long run in combination with Coumadin which puts her at high risk for  rebleeding.  The benefits and the risks of long-term Coumadin therapy  will have to be weighed against her  risk of bleeding.      Lowella Bandy. Olevia Perches, MD  Electronically Signed     DMB/MEDQ  D:  03/08/2008  T:  03/08/2008  Job:  [id number]   cc:   [facility]

## 2010-10-16 NOTE — Cardiovascular Report (Signed)
   NAME:  Pam [name], Pam [name]                            ACCOUNT NO.:  [id number]   MEDICAL RECORD NO.:  [id number]                   PATIENT TYPE:  OIB   LOCATION:  2869                                 FACILITY:  [facility]   PHYSICIAN:  Jeanella Craze. Little, M.D.              DATE OF BIRTH:  1925-10-26   DATE OF PROCEDURE:  01/04/2003  DATE OF DISCHARGE:                              CARDIAC CATHETERIZATION   HISTORY:  Ms. Trotter is a 77 year old female who has new onset atrial  fibrillation.  She has been on rate controlling drugs and has been  anticoagulated now for several months.  She is brought in for an outpatient  for elective cardioversion.   PROCEDURE:  After obtaining IV Diprivan 100 mg by Dr. Glennon Mac she reached an  appropriate level of sedation.   At this point she was electively cardioverted with 200 watt seconds using  anterior/posterior patches.  She converted with a single shock to sinus  rhythm with a rate of 60.  Her blood pressure post cardioversion was 134/63.   She will be maintained on her outpatient medications.  She has a follow-up  appointment in my office in 10 days.                                               Jeanella Craze. Little, M.D.    ABL/MEDQ  D:  01/04/2003  T:  01/04/2003  Job:  [id number]

## 2010-11-04 ENCOUNTER — Encounter (HOSPITAL_BASED_OUTPATIENT_CLINIC_OR_DEPARTMENT_OTHER): Payer: Medicare Other | Admitting: Hematology and Oncology

## 2010-11-04 ENCOUNTER — Other Ambulatory Visit: Payer: Self-pay | Admitting: Hematology and Oncology

## 2010-11-04 DIAGNOSIS — D649 Anemia, unspecified: Secondary | ICD-10-CM

## 2010-11-04 LAB — CBC WITH DIFFERENTIAL/PLATELET
BASO%: 1.2 % (ref 0.0–2.0)
EOS%: 1.8 % (ref 0.0–7.0)
LYMPH%: 27.2 % (ref 14.0–49.7)
MCHC: 34.3 g/dL (ref 31.5–36.0)
MONO#: 1.1 10*3/uL — ABNORMAL HIGH (ref 0.1–0.9)
Platelets: 152 10*3/uL (ref 145–400)
RBC: 4.27 10*6/uL (ref 3.70–5.45)
WBC: 7.7 10*3/uL (ref 3.9–10.3)
nRBC: 0 % (ref 0–0)

## 2010-12-03 ENCOUNTER — Encounter (HOSPITAL_BASED_OUTPATIENT_CLINIC_OR_DEPARTMENT_OTHER): Payer: Medicare Other | Admitting: Hematology and Oncology

## 2010-12-03 ENCOUNTER — Other Ambulatory Visit: Payer: Self-pay | Admitting: Hematology and Oncology

## 2010-12-03 DIAGNOSIS — N189 Chronic kidney disease, unspecified: Secondary | ICD-10-CM

## 2010-12-03 DIAGNOSIS — D638 Anemia in other chronic diseases classified elsewhere: Secondary | ICD-10-CM

## 2010-12-03 DIAGNOSIS — I10 Essential (primary) hypertension: Secondary | ICD-10-CM

## 2010-12-03 DIAGNOSIS — D509 Iron deficiency anemia, unspecified: Secondary | ICD-10-CM

## 2010-12-03 LAB — COMPREHENSIVE METABOLIC PANEL
AST: 23 U/L (ref 0–37)
Albumin: 3.9 g/dL (ref 3.5–5.2)
Alkaline Phosphatase: 81 U/L (ref 39–117)
Potassium: 4.3 mEq/L (ref 3.5–5.3)
Sodium: 139 mEq/L (ref 135–145)
Total Bilirubin: 1 mg/dL (ref 0.3–1.2)
Total Protein: 6.7 g/dL (ref 6.0–8.3)

## 2010-12-03 LAB — CBC WITH DIFFERENTIAL/PLATELET
BASO%: 0.7 % (ref 0.0–2.0)
EOS%: 1.3 % (ref 0.0–7.0)
HGB: 11.2 g/dL — ABNORMAL LOW (ref 11.6–15.9)
MCH: 26.7 pg (ref 25.1–34.0)
MCHC: 34.7 g/dL (ref 31.5–36.0)
RBC: 4.19 10*6/uL (ref 3.70–5.45)
RDW: 14 % (ref 11.2–14.5)
lymph#: 1.9 10*3/uL (ref 0.9–3.3)
nRBC: 0 % (ref 0–0)

## 2010-12-03 LAB — VITAMIN B12: Vitamin B-12: 1036 pg/mL — ABNORMAL HIGH (ref 211–911)

## 2010-12-30 ENCOUNTER — Other Ambulatory Visit: Payer: Self-pay | Admitting: Hematology and Oncology

## 2010-12-30 ENCOUNTER — Encounter (HOSPITAL_BASED_OUTPATIENT_CLINIC_OR_DEPARTMENT_OTHER): Payer: Medicare Other | Admitting: Hematology and Oncology

## 2010-12-30 DIAGNOSIS — N189 Chronic kidney disease, unspecified: Secondary | ICD-10-CM

## 2010-12-30 DIAGNOSIS — D638 Anemia in other chronic diseases classified elsewhere: Secondary | ICD-10-CM

## 2010-12-30 DIAGNOSIS — D509 Iron deficiency anemia, unspecified: Secondary | ICD-10-CM

## 2010-12-30 DIAGNOSIS — I10 Essential (primary) hypertension: Secondary | ICD-10-CM

## 2010-12-30 DIAGNOSIS — D649 Anemia, unspecified: Secondary | ICD-10-CM

## 2010-12-30 LAB — CBC WITH DIFFERENTIAL/PLATELET
BASO%: 1.1 % (ref 0.0–2.0)
Eosinophils Absolute: 0.1 10*3/uL (ref 0.0–0.5)
HCT: 33.8 % — ABNORMAL LOW (ref 34.8–46.6)
LYMPH%: 22.7 % (ref 14.0–49.7)
MONO#: 0.9 10*3/uL (ref 0.1–0.9)
NEUT#: 4 10*3/uL (ref 1.5–6.5)
NEUT%: 60.5 % (ref 38.4–76.8)
Platelets: 154 10*3/uL (ref 145–400)
RBC: 4.33 10*6/uL (ref 3.70–5.45)
WBC: 6.6 10*3/uL (ref 3.9–10.3)
lymph#: 1.5 10*3/uL (ref 0.9–3.3)
nRBC: 0 % (ref 0–0)

## 2011-01-27 ENCOUNTER — Other Ambulatory Visit: Payer: Self-pay | Admitting: Hematology and Oncology

## 2011-01-27 ENCOUNTER — Encounter (HOSPITAL_BASED_OUTPATIENT_CLINIC_OR_DEPARTMENT_OTHER): Payer: Medicare Other | Admitting: Hematology and Oncology

## 2011-01-27 DIAGNOSIS — D649 Anemia, unspecified: Secondary | ICD-10-CM

## 2011-01-27 LAB — CBC WITH DIFFERENTIAL/PLATELET
Basophils Absolute: 0.1 10*3/uL (ref 0.0–0.1)
EOS%: 2.1 % (ref 0.0–7.0)
Eosinophils Absolute: 0.1 10*3/uL (ref 0.0–0.5)
HGB: 11.4 g/dL — ABNORMAL LOW (ref 11.6–15.9)
LYMPH%: 26 % (ref 14.0–49.7)
MCH: 26.6 pg (ref 25.1–34.0)
MCV: 76.9 fL — ABNORMAL LOW (ref 79.5–101.0)
MONO%: 12.2 % (ref 0.0–14.0)
NEUT#: 3.9 10*3/uL (ref 1.5–6.5)
Platelets: 142 10*3/uL — ABNORMAL LOW (ref 145–400)
RBC: 4.29 10*6/uL (ref 3.70–5.45)

## 2011-02-24 ENCOUNTER — Other Ambulatory Visit: Payer: Self-pay | Admitting: Hematology and Oncology

## 2011-02-24 ENCOUNTER — Encounter (HOSPITAL_BASED_OUTPATIENT_CLINIC_OR_DEPARTMENT_OTHER): Payer: Medicare Other | Admitting: Hematology and Oncology

## 2011-02-24 DIAGNOSIS — N189 Chronic kidney disease, unspecified: Secondary | ICD-10-CM

## 2011-02-24 DIAGNOSIS — D638 Anemia in other chronic diseases classified elsewhere: Secondary | ICD-10-CM

## 2011-02-24 DIAGNOSIS — D649 Anemia, unspecified: Secondary | ICD-10-CM

## 2011-02-24 LAB — CBC WITH DIFFERENTIAL/PLATELET
BASO%: 0.8 % (ref 0.0–2.0)
EOS%: 1.8 % (ref 0.0–7.0)
HCT: 31.6 % — ABNORMAL LOW (ref 34.8–46.6)
LYMPH%: 25.8 % (ref 14.0–49.7)
MCH: 26.5 pg (ref 25.1–34.0)
MCHC: 34.2 g/dL (ref 31.5–36.0)
MONO%: 10.4 % (ref 0.0–14.0)
NEUT%: 61.2 % (ref 38.4–76.8)
Platelets: 152 10*3/uL (ref 145–400)

## 2011-03-03 LAB — CBC
HCT: 16.2 — ABNORMAL LOW
HCT: 26.4 — ABNORMAL LOW
HCT: 27.1 — ABNORMAL LOW
Hemoglobin: 5.4 — CL
Hemoglobin: 8.8 — ABNORMAL LOW
MCHC: 33.7
MCV: 89
MCV: 89.7
Platelets: 113 — ABNORMAL LOW
Platelets: 124 — ABNORMAL LOW
Platelets: 128 — ABNORMAL LOW
Platelets: 139 — ABNORMAL LOW
RBC: 1.8 — ABNORMAL LOW
RDW: 14.6
RDW: 14.7
RDW: 14.7
WBC: 12.9 — ABNORMAL HIGH
WBC: 13 — ABNORMAL HIGH

## 2011-03-03 LAB — BASIC METABOLIC PANEL
BUN: 18
BUN: 20
BUN: 31 — ABNORMAL HIGH
Calcium: 9.5
Chloride: 113 — ABNORMAL HIGH
Creatinine, Ser: 1.27 — ABNORMAL HIGH
GFR calc non Af Amer: 38 — ABNORMAL LOW
GFR calc non Af Amer: 40 — ABNORMAL LOW
Glucose, Bld: 86
Glucose, Bld: 98
Potassium: 4
Potassium: 4.6

## 2011-03-03 LAB — CROSSMATCH: ABO/RH(D): O POS

## 2011-03-03 LAB — CATH TIP CULTURE: Culture: >100

## 2011-03-03 LAB — ABO/RH: ABO/RH(D): O POS

## 2011-03-03 LAB — CLOTEST (H. PYLORI), BIOPSY: Helicobacter screen: NEGATIVE

## 2011-03-03 LAB — PREPARE FRESH FROZEN PLASMA

## 2011-03-03 LAB — PROTIME-INR
INR: 1.5
Prothrombin Time: 18.8 — ABNORMAL HIGH

## 2011-03-03 LAB — URINALYSIS, ROUTINE W REFLEX MICROSCOPIC
Bilirubin Urine: NEGATIVE
Hgb urine dipstick: NEGATIVE
Ketones, ur: NEGATIVE
Protein, ur: NEGATIVE
Urobilinogen, UA: 0.2

## 2011-03-03 LAB — COMPREHENSIVE METABOLIC PANEL
ALT: 30
Albumin: 3 — ABNORMAL LOW
Calcium: 9.4
GFR calc Af Amer: 45 — ABNORMAL LOW
Glucose, Bld: 87
Sodium: 140
Total Protein: 5.3 — ABNORMAL LOW

## 2011-03-03 LAB — DIGOXIN LEVEL: Digoxin Level: 1.1

## 2011-03-03 LAB — HEMOGLOBIN AND HEMATOCRIT, BLOOD: Hemoglobin: 10.1 — ABNORMAL LOW

## 2011-03-20 IMAGING — CT CT BIOPSY
4 of 5 series · 16 of 32 positions shown, 19 images · non-contrast
Comparison: none

CLINICAL DATA: Chronic anemia, unknown etiology.

[Series 4: bone windows · axial · 0.74mm/px · z∈[-104,-24]mm · 8 of 20 slices shown (1 of 2)]
[im 2/20  bone]
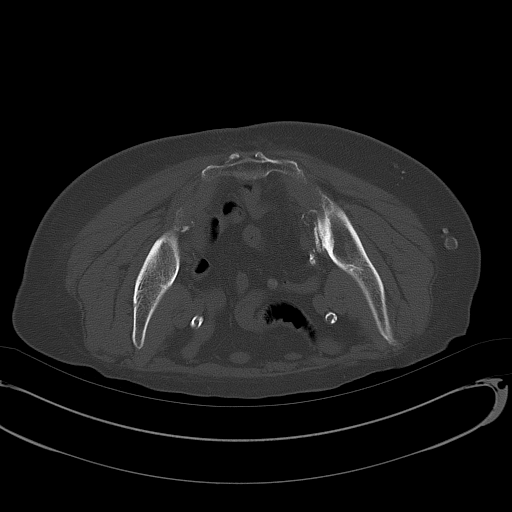
[im 4/20  bone]
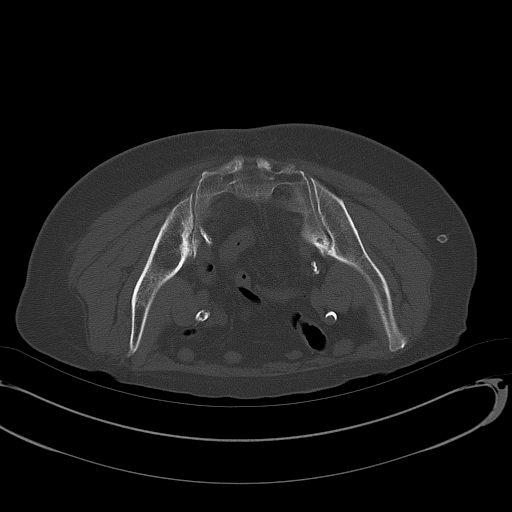
[im 7/20  bone]
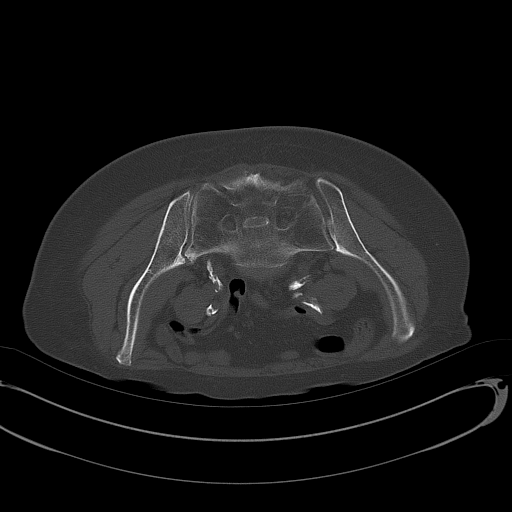
[im 9/20  bone]
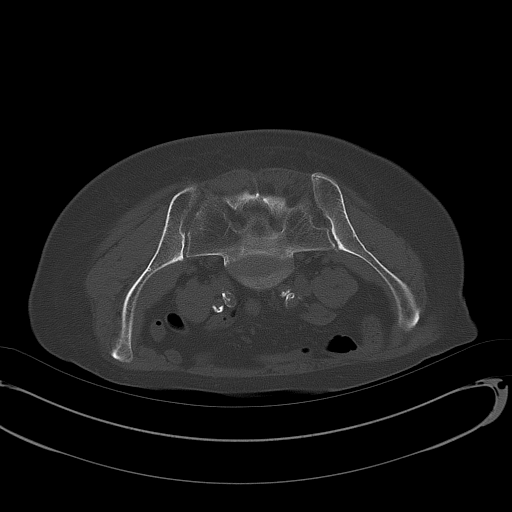
[im 11/20  bone]
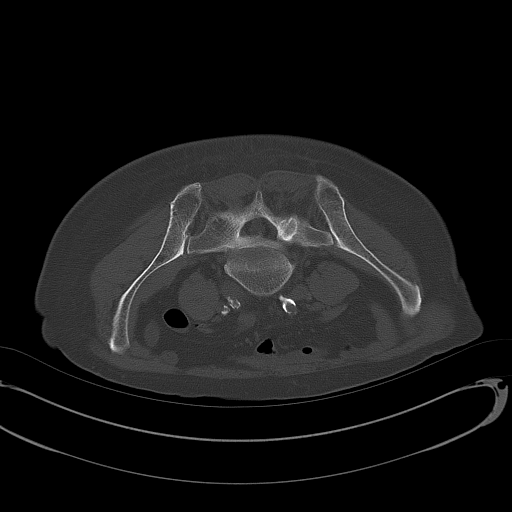
[im 13/20  bone]
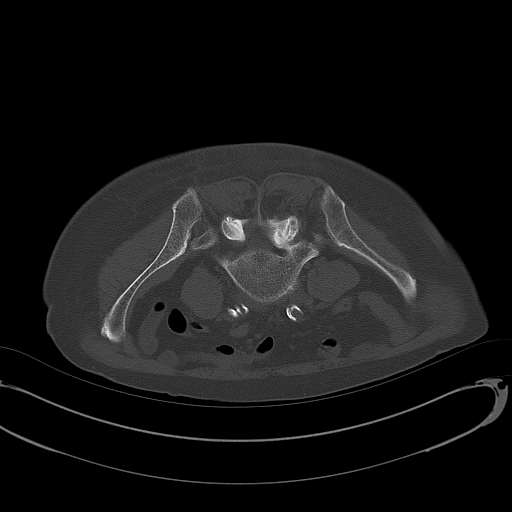
[im 16/20  bone]
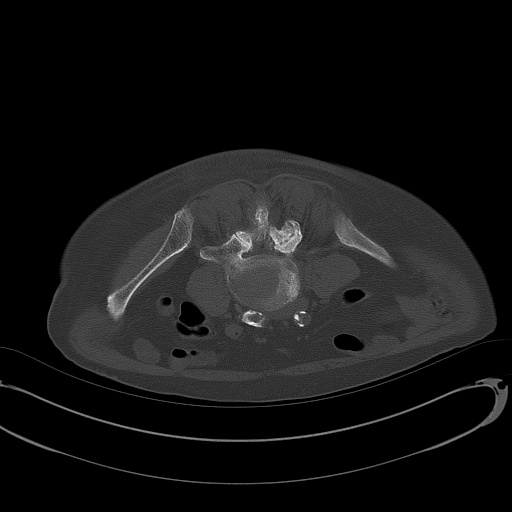
[im 18/20  bone]
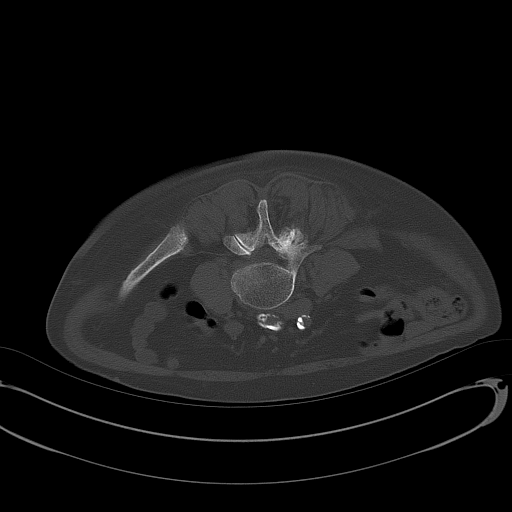

[Series 6: add scan 5.0 b70f · axial · 0.74mm/px · z∈[-65,-55]mm · 2 of 6 slices shown, 5 images (1 of 2)]
[im 2/6  soft-tissue]
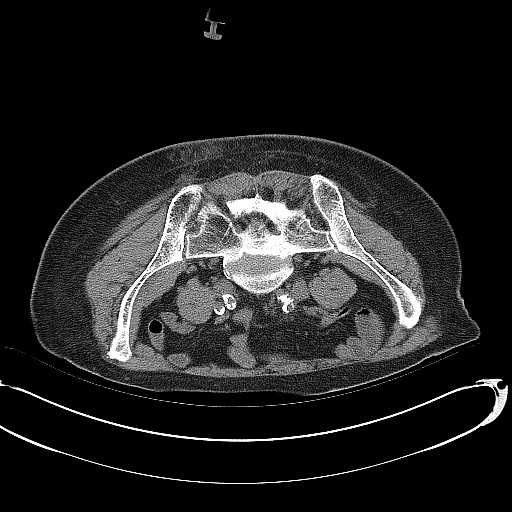
[im 2/6  lung]
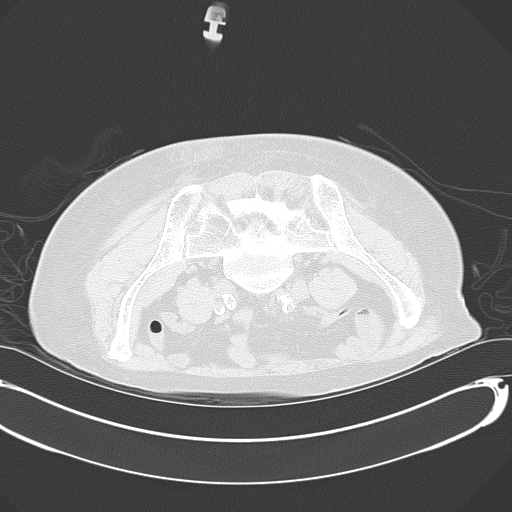
[im 2/6  bone]
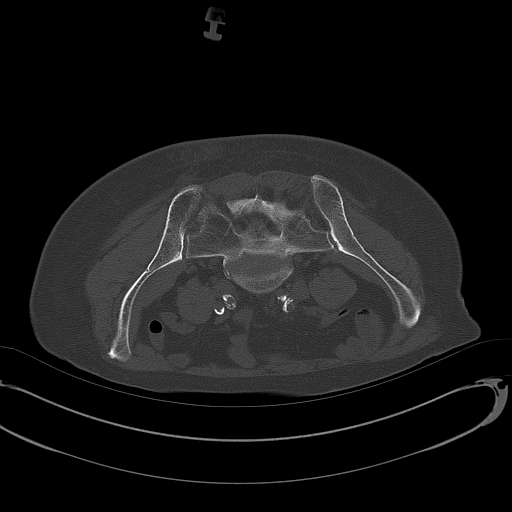
[im 4/6  soft-tissue]
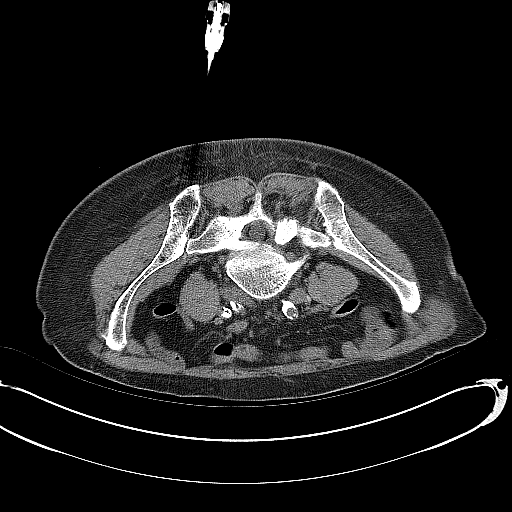
[im 4/6  lung]
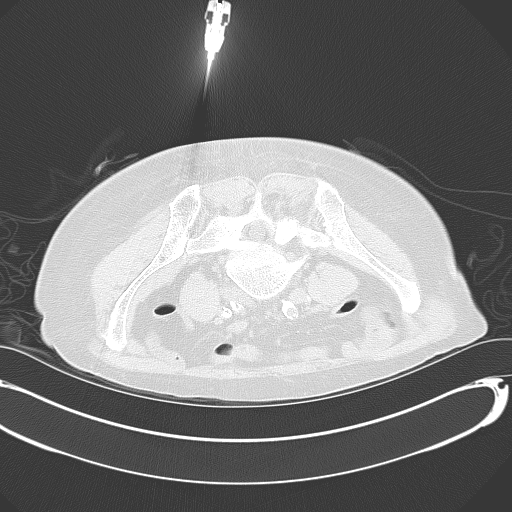

[Series 7: add scan 5.0 b70f · axial · 0.74mm/px · z∈[-65,-55]mm · 2 of 6 slices shown (2 of 2)]
[im 2/6  soft-tissue]
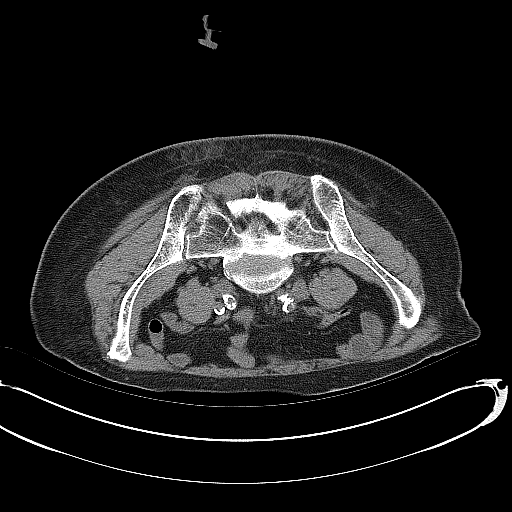
[im 4/6  soft-tissue]
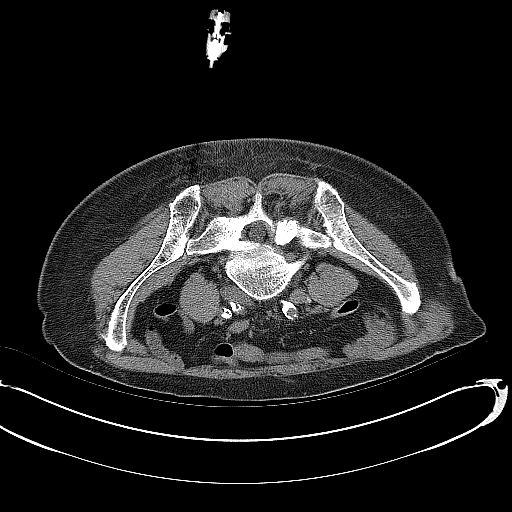

[Series 8: bone windows · axial · 0.74mm/px · z∈[-71,-51]mm · 4 of 9 slices shown (2 of 2)]
[im 2/9  bone]
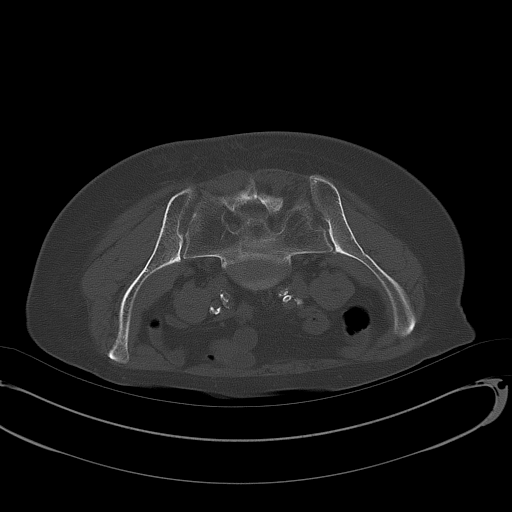
[im 3/9  bone]
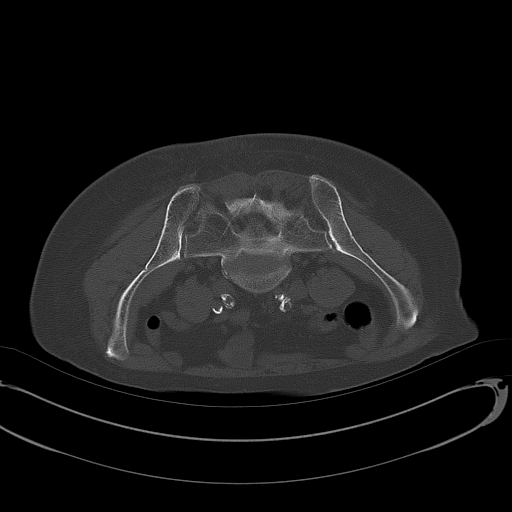
[im 5/9  bone]
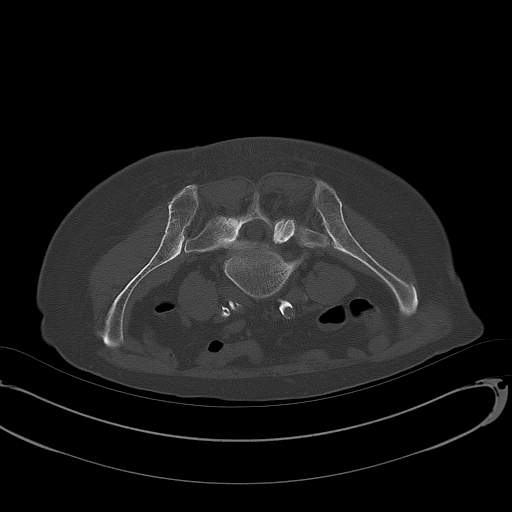
[im 6/9  bone]
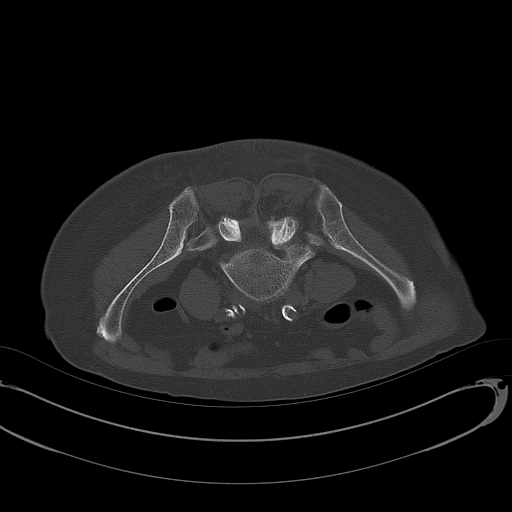

[16 of 32 positions shown; findings below may reference images not displayed]

CT GUIDED LEFT ILIAC BONE MARROW ASPIRATION AND CORE BIOPSY

Date:  12/13/2008 [DATE]

Radiologist:  Yoboka Mohr, M.D.

Medications:  1 mg Versed, 75 mcg Fentanyl

Guidance:  CT

Sedation time:  15 minutes

Contrast volume:  None.

Complications:  No immediate

PROCEDURE/FINDINGS:

Informed consent was obtained from the patient following
explanation of the procedure, risks, benefits and alternatives.
The patient understands, agrees and consents for the procedure.
All questions were addressed.  A time out was performed.

The patient was positioned prone and noncontrast localization CT
was performed of the pelvis to demonstrate the iliac marrow spaces.

Maximal barrier sterile technique utilized including caps, mask,
sterile gowns, sterile gloves, large sterile drape, hand hygiene,
and betadine prep.

Under sterile conditions and local anesthesia, an 11 gauge coaxial
bone biopsy needle was advanced into the left iliac marrow space.
Needle position was confirmed with CT imaging. Initially, bone
marrow aspiration was performed. Next, the 11 gauge outer cannula
was utilized to obtain a left iliac bone marrow core biopsy. Needle
was removed. Hemostasis was obtained with compression. The patient
tolerated the procedure well. Samples were prepared with the
cytotechnologist. No immediate complications.
IMPRESSION: CT guided left iliac bone marrow aspiration and core biopsy.

## 2011-03-24 ENCOUNTER — Encounter (HOSPITAL_BASED_OUTPATIENT_CLINIC_OR_DEPARTMENT_OTHER): Payer: Medicare Other | Admitting: Hematology and Oncology

## 2011-03-24 ENCOUNTER — Other Ambulatory Visit: Payer: Self-pay | Admitting: Hematology and Oncology

## 2011-03-24 DIAGNOSIS — D649 Anemia, unspecified: Secondary | ICD-10-CM

## 2011-03-24 LAB — CBC WITH DIFFERENTIAL/PLATELET
Eosinophils Absolute: 0.1 10*3/uL (ref 0.0–0.5)
HGB: 12.1 g/dL (ref 11.6–15.9)
LYMPH%: 25.4 % (ref 14.0–49.7)
MCH: 26.8 pg (ref 25.1–34.0)
MCHC: 34.3 g/dL (ref 31.5–36.0)
MONO%: 10.7 % (ref 0.0–14.0)
NEUT%: 61.1 % (ref 38.4–76.8)
RBC: 4.52 10*6/uL (ref 3.70–5.45)
RDW: 14.5 % (ref 11.2–14.5)
lymph#: 1.7 10*3/uL (ref 0.9–3.3)

## 2011-04-04 ENCOUNTER — Telehealth: Payer: Self-pay | Admitting: Hematology and Oncology

## 2011-04-19 ENCOUNTER — Other Ambulatory Visit: Payer: Self-pay | Admitting: Hematology and Oncology

## 2011-04-21 ENCOUNTER — Other Ambulatory Visit: Payer: Self-pay | Admitting: Hematology and Oncology

## 2011-04-21 ENCOUNTER — Other Ambulatory Visit (HOSPITAL_BASED_OUTPATIENT_CLINIC_OR_DEPARTMENT_OTHER): Payer: Medicare Other

## 2011-04-21 ENCOUNTER — Ambulatory Visit (HOSPITAL_BASED_OUTPATIENT_CLINIC_OR_DEPARTMENT_OTHER): Payer: Medicare Other

## 2011-04-21 VITALS — BP 122/65 | HR 58 | Temp 98.7°F

## 2011-04-21 DIAGNOSIS — D649 Anemia, unspecified: Secondary | ICD-10-CM

## 2011-04-21 DIAGNOSIS — D638 Anemia in other chronic diseases classified elsewhere: Secondary | ICD-10-CM

## 2011-04-21 DIAGNOSIS — N289 Disorder of kidney and ureter, unspecified: Secondary | ICD-10-CM

## 2011-04-21 LAB — CBC WITH DIFFERENTIAL/PLATELET
Basophils Absolute: 0.1 10*3/uL (ref 0.0–0.1)
EOS%: 2.3 % (ref 0.0–7.0)
HCT: 31.8 % — ABNORMAL LOW (ref 34.8–46.6)
HGB: 10.9 g/dL — ABNORMAL LOW (ref 11.6–15.9)
LYMPH%: 23.9 % (ref 14.0–49.7)
MCH: 26.5 pg (ref 25.1–34.0)
MCHC: 34.3 g/dL (ref 31.5–36.0)
MCV: 77.2 fL — ABNORMAL LOW (ref 79.5–101.0)
MONO%: 12.3 % (ref 0.0–14.0)
NEUT%: 60.6 % (ref 38.4–76.8)
Platelets: 160 10*3/uL (ref 145–400)

## 2011-04-21 MED ORDER — DARBEPOETIN ALFA-POLYSORBATE 500 MCG/ML IJ SOLN
300.0000 ug | Freq: Once | INTRAMUSCULAR | Status: AC
  Administered 2011-04-21: 300 ug via SUBCUTANEOUS
  Filled 2011-04-21: qty 1

## 2011-05-11 ENCOUNTER — Encounter: Payer: Self-pay | Admitting: *Deleted

## 2011-05-12 ENCOUNTER — Other Ambulatory Visit: Payer: Self-pay | Admitting: Hematology and Oncology

## 2011-05-12 ENCOUNTER — Other Ambulatory Visit (HOSPITAL_BASED_OUTPATIENT_CLINIC_OR_DEPARTMENT_OTHER): Payer: Medicare Other | Admitting: Lab

## 2011-05-12 ENCOUNTER — Ambulatory Visit (HOSPITAL_BASED_OUTPATIENT_CLINIC_OR_DEPARTMENT_OTHER): Payer: Medicare Other | Admitting: Hematology and Oncology

## 2011-05-12 VITALS — BP 173/79 | HR 59 | Temp 97.0°F | Ht 63.5 in | Wt 132.1 lb

## 2011-05-12 DIAGNOSIS — D638 Anemia in other chronic diseases classified elsewhere: Secondary | ICD-10-CM

## 2011-05-12 DIAGNOSIS — D649 Anemia, unspecified: Secondary | ICD-10-CM

## 2011-05-12 DIAGNOSIS — D509 Iron deficiency anemia, unspecified: Secondary | ICD-10-CM

## 2011-05-12 DIAGNOSIS — I10 Essential (primary) hypertension: Secondary | ICD-10-CM

## 2011-05-12 DIAGNOSIS — N289 Disorder of kidney and ureter, unspecified: Secondary | ICD-10-CM

## 2011-05-12 DIAGNOSIS — N189 Chronic kidney disease, unspecified: Secondary | ICD-10-CM

## 2011-05-12 LAB — CBC WITH DIFFERENTIAL/PLATELET
BASO%: 0.4 % (ref 0.0–2.0)
Eosinophils Absolute: 0.1 10*3/uL (ref 0.0–0.5)
HGB: 13.6 g/dL (ref 11.6–15.9)
LYMPH%: 24.7 % (ref 14.0–49.7)
MCH: 27.4 pg (ref 25.1–34.0)
MCV: 82.2 fL (ref 79.5–101.0)
MONO#: 0.5 10*3/uL (ref 0.1–0.9)
RBC: 4.96 10*6/uL (ref 3.70–5.45)
lymph#: 1.4 10*3/uL (ref 0.9–3.3)

## 2011-05-12 LAB — BASIC METABOLIC PANEL
CO2: 20 mEq/L (ref 19–32)
Calcium: 11.4 mg/dL — ABNORMAL HIGH (ref 8.4–10.5)
Creatinine, Ser: 1.46 mg/dL — ABNORMAL HIGH (ref 0.50–1.10)
Glucose, Bld: 111 mg/dL — ABNORMAL HIGH (ref 70–99)
Sodium: 135 mEq/L (ref 135–145)

## 2011-05-12 LAB — IRON AND TIBC: UIBC: 260 ug/dL (ref 125–400)

## 2011-05-12 LAB — FERRITIN: Ferritin: 131 ng/mL (ref 10–291)

## 2011-05-12 NOTE — Progress Notes (Signed)
This office note has been dictated.

## 2011-05-12 NOTE — Progress Notes (Signed)
CC:   Jeanella Craze. Little, M.D.  IDENTIFYING STATEMENT:  Patient is an 77 year old woman with iron- deficiency anemia, anemia of chronic disease who presents for followup.  INTERIM HISTORY:  Mrs. Pondexter continues to tolerate Aranesp.  She has no unusual complaints.  She is not short of breath.  She denies overt rectal bleeding.  She does tell me that Cardiology had placed her back on Coumadin.  She thinks that this is due to an irregular heart rate.  Today's blood count notes a white cell count of 5.6, hemoglobin 13.6, hematocrit 40.8, platelets 179.  MEDICATIONS:  Aranesp 300 mcg every 3 weeks.  Integra Plus 1 daily. Rest of medication list reviewed and updated.  ALLERGIES:  None.  PHYSICAL EXAM:  The patient is a well-appearing, well-nourished woman in no distress.  Vitals:  Pulse 59, blood pressure 172/79, temperature 97, respirations 20, weight 132 pounds.  HEENT:  Head is atraumatic, normocephalic.  Sclerae anicteric.  Mouth moist.  Chest:  Clear. Abdomen:  Soft.  Extremities:  No edema.  LAB DATA:  CBC as above.  Anemia panel pending.  B-MET pending.  IMPRESSION AND PLAN:  Mrs. Kupka is an 77 year old woman with iron- deficiency anemia and anemia of chronic disease with underlying chronic kidney disease.  Her hemoglobin is being supported with Aranesp every 3 weeks.  Her hemoglobin and hematocrit are currently unremarkable and thus she and will not be receiving an injection at this visit.  We will adjuct her schedule to receive aranesp monthly.  She follows up in 6 months' time.    ______________________________ Nira Retort, M.D. LIO/MEDQ  D:  05/12/2011  T:  05/12/2011  Job:  [id number]

## 2011-05-19 ENCOUNTER — Ambulatory Visit: Payer: Medicare Other | Admitting: Hematology and Oncology

## 2011-06-09 ENCOUNTER — Ambulatory Visit: Payer: Medicare Other

## 2011-06-09 ENCOUNTER — Other Ambulatory Visit (HOSPITAL_BASED_OUTPATIENT_CLINIC_OR_DEPARTMENT_OTHER): Payer: Medicare Other | Admitting: Lab

## 2011-06-09 DIAGNOSIS — N289 Disorder of kidney and ureter, unspecified: Secondary | ICD-10-CM

## 2011-06-09 DIAGNOSIS — D649 Anemia, unspecified: Secondary | ICD-10-CM

## 2011-06-09 LAB — CBC WITH DIFFERENTIAL/PLATELET
EOS%: 2.4 % (ref 0.0–7.0)
Eosinophils Absolute: 0.2 10*3/uL (ref 0.0–0.5)
LYMPH%: 22.2 % (ref 14.0–49.7)
MCH: 26.2 pg (ref 25.1–34.0)
MCV: 78.1 fL — ABNORMAL LOW (ref 79.5–101.0)
MONO%: 12.7 % (ref 0.0–14.0)
NEUT#: 5.3 10*3/uL (ref 1.5–6.5)
Platelets: 143 10*3/uL — ABNORMAL LOW (ref 145–400)
RBC: 4.39 10*6/uL (ref 3.70–5.45)
RDW: 14.9 % — ABNORMAL HIGH (ref 11.2–14.5)
nRBC: 0 % (ref 0–0)

## 2011-06-09 MED ORDER — DARBEPOETIN ALFA-POLYSORBATE 500 MCG/ML IJ SOLN: 300.0000 ug | Freq: Once | INTRAMUSCULAR | Status: DC

## 2011-07-07 ENCOUNTER — Other Ambulatory Visit: Payer: Medicare Other | Admitting: Lab

## 2011-07-07 ENCOUNTER — Ambulatory Visit (HOSPITAL_BASED_OUTPATIENT_CLINIC_OR_DEPARTMENT_OTHER): Payer: Medicare Other

## 2011-07-07 DIAGNOSIS — D649 Anemia, unspecified: Secondary | ICD-10-CM

## 2011-07-07 DIAGNOSIS — N289 Disorder of kidney and ureter, unspecified: Secondary | ICD-10-CM

## 2011-07-07 LAB — CBC WITH DIFFERENTIAL/PLATELET
Eosinophils Absolute: 0.2 10*3/uL (ref 0.0–0.5)
HCT: 30.3 % — ABNORMAL LOW (ref 34.8–46.6)
LYMPH%: 20 % (ref 14.0–49.7)
MCV: 77.5 fL — ABNORMAL LOW (ref 79.5–101.0)
MONO#: 1 10*3/uL — ABNORMAL HIGH (ref 0.1–0.9)
MONO%: 11.3 % (ref 0.0–14.0)
NEUT#: 6 10*3/uL (ref 1.5–6.5)
NEUT%: 66.1 % (ref 38.4–76.8)
Platelets: 166 10*3/uL (ref 145–400)
WBC: 9.1 10*3/uL (ref 3.9–10.3)

## 2011-07-07 MED ORDER — DARBEPOETIN ALFA-POLYSORBATE 300 MCG/0.6ML IJ SOLN
300.0000 ug | Freq: Once | INTRAMUSCULAR | Status: AC
  Administered 2011-07-07: 300 ug via SUBCUTANEOUS
  Filled 2011-07-07: qty 1

## 2011-07-07 NOTE — Patient Instructions (Signed)
[facility] Discharge Instructions for Patients Receiving  Today you received the following chemotherapy agents Aranesp    If you develop nausea and vomiting that is not controlled by your nausea medication, call the clinic. If it is after clinic hours your family physician or the after hours number for the clinic or go to the Emergency Department.   BELOW ARE SYMPTOMS THAT SHOULD BE REPORTED IMMEDIATELY:  *FEVER GREATER THAN 100.5 F  *CHILLS WITH OR WITHOUT FEVER  NAUSEA AND VOMITING THAT IS NOT CONTROLLED WITH YOUR NAUSEA MEDICATION  *UNUSUAL SHORTNESS OF BREATH  *UNUSUAL BRUISING OR BLEEDING  TENDERNESS IN MOUTH AND THROAT WITH OR WITHOUT PRESENCE OF ULCERS  *URINARY PROBLEMS  *BOWEL PROBLEMS  UNUSUAL RASH Items with * indicate a potential emergency and should be followed up as soon as possible.  Feel free to call the clinic you have any questions or concerns. The clinic phone number is [phone].   I have been informed and understand all the instructions given to me. I know to contact the clinic, my physician, or go to the Emergency Department if any problems should occur. I do not have any questions at this time, but understand that I may call the clinic during office hours   should I have any questions or need assistance in obtaining follow up care.    __________________________________________  _____________  __________ Signature of Patient or Authorized Representative            Date                   Time    __________________________________________ Nurse's Signature

## 2011-07-07 NOTE — Progress Notes (Signed)
Injection appt:  Pt. Arrived for Aranesp injection. Feeling well without issues. Hgb 10.3 hct 30.3 today.  ARanesp given. Pt. Denied dizziness or active bleeding. HL

## 2011-08-03 ENCOUNTER — Other Ambulatory Visit: Payer: Self-pay | Admitting: *Deleted

## 2011-08-03 DIAGNOSIS — D649 Anemia, unspecified: Secondary | ICD-10-CM

## 2011-08-04 ENCOUNTER — Other Ambulatory Visit (HOSPITAL_BASED_OUTPATIENT_CLINIC_OR_DEPARTMENT_OTHER): Payer: Medicare Other | Admitting: Lab

## 2011-08-04 ENCOUNTER — Ambulatory Visit: Payer: Medicare Other

## 2011-08-04 DIAGNOSIS — D649 Anemia, unspecified: Secondary | ICD-10-CM

## 2011-08-04 LAB — CBC WITH DIFFERENTIAL/PLATELET
Basophils Absolute: 0.1 10*3/uL (ref 0.0–0.1)
EOS%: 2.1 % (ref 0.0–7.0)
Eosinophils Absolute: 0.1 10*3/uL (ref 0.0–0.5)
LYMPH%: 26 % (ref 14.0–49.7)
MCH: 26.5 pg (ref 25.1–34.0)
MONO%: 13.4 % (ref 0.0–14.0)
NEUT#: 3.8 10*3/uL (ref 1.5–6.5)
NEUT%: 57 % (ref 38.4–76.8)
WBC: 6.7 10*3/uL (ref 3.9–10.3)

## 2011-08-04 NOTE — Progress Notes (Signed)
Aranesp held for Hgb 12.6. Patient given copy of labs and instructed to return for future appointments and to call with any concerns.

## 2011-08-31 ENCOUNTER — Other Ambulatory Visit: Payer: Self-pay | Admitting: *Deleted

## 2011-08-31 DIAGNOSIS — D649 Anemia, unspecified: Secondary | ICD-10-CM

## 2011-09-01 ENCOUNTER — Ambulatory Visit (HOSPITAL_BASED_OUTPATIENT_CLINIC_OR_DEPARTMENT_OTHER): Payer: Medicare Other

## 2011-09-01 ENCOUNTER — Other Ambulatory Visit (HOSPITAL_BASED_OUTPATIENT_CLINIC_OR_DEPARTMENT_OTHER): Payer: Medicare Other | Admitting: Lab

## 2011-09-01 VITALS — BP 133/77 | HR 62 | Temp 97.0°F

## 2011-09-01 DIAGNOSIS — D649 Anemia, unspecified: Secondary | ICD-10-CM

## 2011-09-01 DIAGNOSIS — N289 Disorder of kidney and ureter, unspecified: Secondary | ICD-10-CM

## 2011-09-01 LAB — CBC WITH DIFFERENTIAL/PLATELET
Basophils Absolute: 0.1 10*3/uL (ref 0.0–0.1)
Eosinophils Absolute: 0.2 10*3/uL (ref 0.0–0.5)
HGB: 10.9 g/dL — ABNORMAL LOW (ref 11.6–15.9)
MCV: 76.8 fL — ABNORMAL LOW (ref 79.5–101.0)
MONO#: 0.9 10*3/uL (ref 0.1–0.9)
MONO%: 11.5 % (ref 0.0–14.0)
NEUT#: 5 10*3/uL (ref 1.5–6.5)
Platelets: 156 10*3/uL (ref 145–400)
RBC: 4.1 10*6/uL (ref 3.70–5.45)
RDW: 14.5 % (ref 11.2–14.5)
WBC: 7.9 10*3/uL (ref 3.9–10.3)
nRBC: 0 % (ref 0–0)

## 2011-09-01 MED ORDER — DARBEPOETIN ALFA-POLYSORBATE 300 MCG/0.6ML IJ SOLN
300.0000 ug | Freq: Once | INTRAMUSCULAR | Status: AC
  Administered 2011-09-01: 300 ug via SUBCUTANEOUS
  Filled 2011-09-01: qty 1

## 2011-09-29 ENCOUNTER — Ambulatory Visit: Payer: Medicare Other

## 2011-09-29 ENCOUNTER — Other Ambulatory Visit (HOSPITAL_BASED_OUTPATIENT_CLINIC_OR_DEPARTMENT_OTHER): Payer: Medicare Other | Admitting: Lab

## 2011-09-29 DIAGNOSIS — N289 Disorder of kidney and ureter, unspecified: Secondary | ICD-10-CM

## 2011-09-29 DIAGNOSIS — D649 Anemia, unspecified: Secondary | ICD-10-CM

## 2011-09-29 LAB — CBC WITH DIFFERENTIAL/PLATELET
BASO%: 1.3 % (ref 0.0–2.0)
Eosinophils Absolute: 0.1 10*3/uL (ref 0.0–0.5)
HCT: 37.3 % (ref 34.8–46.6)
LYMPH%: 23.9 % (ref 14.0–49.7)
MCHC: 34.6 g/dL (ref 31.5–36.0)
MCV: 77.1 fL — ABNORMAL LOW (ref 79.5–101.0)
MONO%: 13.8 % (ref 0.0–14.0)
NEUT%: 59.6 % (ref 38.4–76.8)
Platelets: 154 10*3/uL (ref 145–400)
RBC: 4.84 10*6/uL (ref 3.70–5.45)

## 2011-09-29 MED ORDER — DARBEPOETIN ALFA-POLYSORBATE 500 MCG/ML IJ SOLN: 300.0000 ug | Freq: Once | INTRAMUSCULAR | Status: DC

## 2011-10-27 ENCOUNTER — Other Ambulatory Visit: Payer: Medicare Other | Admitting: Lab

## 2011-10-27 ENCOUNTER — Ambulatory Visit (HOSPITAL_BASED_OUTPATIENT_CLINIC_OR_DEPARTMENT_OTHER): Payer: Medicare Other

## 2011-10-27 VITALS — BP 116/62 | HR 86 | Temp 97.5°F

## 2011-10-27 DIAGNOSIS — D649 Anemia, unspecified: Secondary | ICD-10-CM

## 2011-10-27 DIAGNOSIS — N289 Disorder of kidney and ureter, unspecified: Secondary | ICD-10-CM

## 2011-10-27 LAB — COMPREHENSIVE METABOLIC PANEL
Albumin: 3.8 g/dL (ref 3.5–5.2)
Alkaline Phosphatase: 70 U/L (ref 39–117)
BUN: 45 mg/dL — ABNORMAL HIGH (ref 6–23)
CO2: 21 mEq/L (ref 19–32)
Glucose, Bld: 144 mg/dL — ABNORMAL HIGH (ref 70–99)
Total Bilirubin: 0.7 mg/dL (ref 0.3–1.2)

## 2011-10-27 LAB — CBC WITH DIFFERENTIAL/PLATELET
BASO%: 1.3 % (ref 0.0–2.0)
EOS%: 1.6 % (ref 0.0–7.0)
HCT: 28.9 % — ABNORMAL LOW (ref 34.8–46.6)
LYMPH%: 22 % (ref 14.0–49.7)
MCH: 26.8 pg (ref 25.1–34.0)
MCHC: 33 g/dL (ref 31.5–36.0)
MONO#: 0.7 10*3/uL (ref 0.1–0.9)
NEUT%: 65.5 % (ref 38.4–76.8)
Platelets: 132 10*3/uL — ABNORMAL LOW (ref 145–400)
RBC: 3.57 10*6/uL — ABNORMAL LOW (ref 3.70–5.45)
WBC: 7 10*3/uL (ref 3.9–10.3)
nRBC: 0 % (ref 0–0)

## 2011-10-27 MED ORDER — DARBEPOETIN ALFA-POLYSORBATE 300 MCG/0.6ML IJ SOLN
300.0000 ug | Freq: Once | INTRAMUSCULAR | Status: AC
  Administered 2011-10-27: 300 ug via SUBCUTANEOUS
  Filled 2011-10-27: qty 0.6

## 2011-10-28 ENCOUNTER — Other Ambulatory Visit: Payer: Self-pay | Admitting: *Deleted

## 2011-10-30 ENCOUNTER — Encounter (HOSPITAL_COMMUNITY): Payer: Self-pay | Admitting: *Deleted

## 2011-10-30 ENCOUNTER — Emergency Department (HOSPITAL_COMMUNITY)
Admission: EM | Admit: 2011-10-30 | Discharge: 2011-10-30 | Disposition: A | Discharge status: Home / Self Care | Payer: Medicare Other | Attending: Emergency Medicine | Admitting: Emergency Medicine

## 2011-10-30 DIAGNOSIS — R1031 Right lower quadrant pain: Secondary | ICD-10-CM | POA: Insufficient documentation

## 2011-10-30 DIAGNOSIS — E7800 Pure hypercholesterolemia, unspecified: Secondary | ICD-10-CM | POA: Insufficient documentation

## 2011-10-30 DIAGNOSIS — N189 Chronic kidney disease, unspecified: Secondary | ICD-10-CM | POA: Insufficient documentation

## 2011-10-30 DIAGNOSIS — K921 Melena: Secondary | ICD-10-CM | POA: Insufficient documentation

## 2011-10-30 DIAGNOSIS — K922 Gastrointestinal hemorrhage, unspecified: Secondary | ICD-10-CM

## 2011-10-30 DIAGNOSIS — I129 Hypertensive chronic kidney disease with stage 1 through stage 4 chronic kidney disease, or unspecified chronic kidney disease: Secondary | ICD-10-CM | POA: Insufficient documentation

## 2011-10-30 DIAGNOSIS — I4891 Unspecified atrial fibrillation: Secondary | ICD-10-CM | POA: Insufficient documentation

## 2011-10-30 DIAGNOSIS — D649 Anemia, unspecified: Secondary | ICD-10-CM | POA: Insufficient documentation

## 2011-10-30 DIAGNOSIS — Z8719 Personal history of other diseases of the digestive system: Secondary | ICD-10-CM | POA: Insufficient documentation

## 2011-10-30 LAB — URINALYSIS, ROUTINE W REFLEX MICROSCOPIC
Bilirubin Urine: NEGATIVE
Nitrite: NEGATIVE
Specific Gravity, Urine: 1.021 (ref 1.005–1.030)
pH: 5 (ref 5.0–8.0)

## 2011-10-30 LAB — COMPREHENSIVE METABOLIC PANEL
ALT: 22 U/L (ref 0–35)
AST: 24 U/L (ref 0–37)
Calcium: 10.5 mg/dL (ref 8.4–10.5)
GFR calc Af Amer: 26 mL/min — ABNORMAL LOW (ref >90)
Glucose, Bld: 93 mg/dL (ref 70–99)
Sodium: 138 mEq/L (ref 135–145)
Total Protein: 7.1 g/dL (ref 6.0–8.3)

## 2011-10-30 LAB — URINE MICROSCOPIC-ADD ON

## 2011-10-30 LAB — DIFFERENTIAL
Basophils Absolute: 0.1 10*3/uL (ref 0.0–0.1)
Basophils Relative: 1 % (ref 0–1)
Eosinophils Absolute: 0.1 10*3/uL (ref 0.0–0.7)
Eosinophils Relative: 1 % (ref 0–5)

## 2011-10-30 LAB — CBC
MCH: 26.5 pg (ref 26.0–34.0)
MCV: 78.4 fL (ref 78.0–100.0)
Platelets: 166 10*3/uL (ref 150–400)
RDW: 14.9 % (ref 11.5–15.5)

## 2011-10-30 LAB — PROTIME-INR: INR: 1.49 (ref 0.00–1.49)

## 2011-10-30 LAB — APTT: aPTT: 31 seconds (ref 24–37)

## 2011-10-30 NOTE — Discharge Instructions (Signed)
Please read and follow all provided instructions.  Your diagnoses today include:  1. GI bleed     Tests performed today include:  Blood counts - borderline blood count of 9.2  Coumadin level - 1.4   Vital signs. See below for your results today.   Medications prescribed:   None  Home care instructions:  Follow any educational materials contained in this packet.  Take your Nexium twice a day for the next 3 days.   Follow-up instructions: Please follow-up with your primary care provider in the next 2 days for further evaluation of your symptoms. If you do not have a primary care doctor -- see below for referral information.   Return instructions:   Please return to the Emergency Department if you experience worsening symptoms.   Return with worsening stool, bleeding, shortness of breath, lightheadedness with standing, or if you pass out  Please return if you have any other emergent concerns.  Additional Information:  Your vital signs today were: BP 141/45  Pulse 68  Temp(Src) 98.3 F (36.8 C) (Oral)  Resp 20  SpO2 97% If your blood pressure (BP) was elevated above 135/85 this visit, please have this repeated by your doctor within one month. -------------- No Primary Care Doctor Call [facility]  [phone] Other agencies that provide inexpensive medical care    [place]  [facility]  [place]  [phone]    [facility]  [phone]    [facility]  [phone] -------------- RESOURCE GUIDE:  Dental Problems  Patients with Medicaid: [facility] [phone] [address] Phone:  [phone]                                                   Phone:  [phone]  If unable to pay or uninsured, contact:  [facility] or [facility]. to  become qualified for the adult dental clinic.  Chronic Pain Problems Contact [facility]  [phone] Patients need to be referred by their primary care doctor.  Insufficient Money for Medicine Contact [employer]:  call "211" or [place] [phone].  [place]  [phone] [facility]  [place]   [phone] (emergency services [phone])  Substance Abuse [facility]  [phone] [facility] [phone] [facility] [place] [phone] [facility] [phone] Residential & Outpatient Substance Abuse Program  [phone]  Abuse/Neglect [place] [phone] [place] [phone] (After Hours)  Emergency [place] [phone]  [place] [facility] the [place] [phone] [place] [phone]  [facility]  [facility] Dept. [address]      [place] Phone:  [phone]                                   Phone:  [phone]                 Phone:  [place] Phone:  [place] [phone] (After Hours)

## 2011-10-30 NOTE — ED Notes (Signed)
Not able to obtain IV access.  Lab in to draw blood.  IV nurse paged.

## 2011-10-30 NOTE — ED Notes (Signed)
Pt. Continues to have dark stools after being taken off coumadin and aspirin on Thurs. By her PCP.  Pt. Also c/o right upper quadrant pain.  Denies nausea, vomiting, and diarrhea.  Pt. Reports the pain feels a little like kidney stone pain that she has had before only not as severe.  Pain radiates from front to back.

## 2011-10-30 NOTE — ED Notes (Signed)
RN to obtain labs with start of IV 

## 2011-10-30 NOTE — ED Provider Notes (Signed)
History     CSN: [id number]  Arrival date & time 10/30/11  1541   First MD Initiated Contact with Patient 10/30/11 1622      Chief Complaint  Patient presents with  . Abdominal Pain    RLQ  . Melena    (Consider location/radiation/quality/duration/timing/severity/associated sxs/prior treatment) HPI Comments: Patient with history of iron deficiency anemia and anemia of chronic disease presents with melena for the past 6 days. Patient states that she saw her primary care doctor yesterday for this problem and was discontinued on her Coumadin (for irregular heartbeat) and aspirin. Patient states that her melena continued today so she came to the emergency department for evaluation. Patient also notes that she has had right lower quadrant abdominal pain since the onset of the melena. Pain is described as a tightness and radiates around to her lower back. Patient has a history of kidney stones however this pain does not feel as strong as she is experienced in the past. Patient denies shortness of breath with activity and denies lightheadedness with standing. Patient denies fever, upper respiratory tract infection symptoms, chest pain, shortness of breath, urinary symptoms, lower extremity edema. Nothing makes symptoms better or worse. Course is constant.   Patient is a 78 y.o. female presenting with abdominal pain. The history is provided by the patient.  Abdominal Pain The primary symptoms of the illness include abdominal pain. The primary symptoms of the illness do not include fever, fatigue, shortness of breath, nausea, vomiting, diarrhea, hematemesis, hematochezia, dysuria or vaginal bleeding. The current episode started more than 2 days ago. The problem has not changed since onset. Significant associated medical issues do not include PUD.    Past Medical History  Diagnosis Date  . Anemia   . Hyperplastic colonic polyp   . Thyroid disease     Hypoparathyroidism  . Diverticulosis   .  Vitamin d deficiency   . Spinal stenosis   . Osteoporosis   . Chronic kidney disease   . Atrial fibrillation   . Hypercholesterolemia   . Hypertension   . GERD (gastroesophageal reflux disease)     Past Surgical History  Procedure Date  . S/p removal of bilateral kidney stones   . Abdominal hysterectomy     History reviewed. No pertinent family history.  History  Substance Use Topics  . Smoking status: Passive Smoker    Types: Cigarettes  . Smokeless tobacco: Never Used  . Alcohol Use: Yes     occ    OB History    Grav Para Term Preterm Abortions TAB SAB Ect Mult Living                  Review of Systems  Constitutional: Negative for fever and fatigue.  HENT: Negative for sore throat and rhinorrhea.   Eyes: Negative for redness.  Respiratory: Negative for cough and shortness of breath.   Cardiovascular: Negative for chest pain.  Gastrointestinal: Positive for abdominal pain and blood in stool. Negative for nausea, vomiting, diarrhea, hematochezia and hematemesis.  Genitourinary: Negative for dysuria and vaginal bleeding.  Musculoskeletal: Negative for myalgias.  Skin: Negative for rash.  Neurological: Negative for headaches.    Allergies  Review of patient's allergies indicates no known allergies.  Home Medications   Current Outpatient Rx  Name Route Sig Dispense Refill  . ALISKIREN FUMARATE 150 MG PO TABS Oral Take 150 mg by mouth daily.      Marland Kitchen DIGOXIN 0.125 MG PO TABS Oral Take 125 mcg by mouth  daily.      Marland Kitchen ESOMEPRAZOLE MAGNESIUM 40 MG PO CPDR Oral Take 40 mg by mouth daily before breakfast.      . INTEGRA PLUS PO Oral Take 1 tablet by mouth daily.      . FUROSEMIDE 20 MG PO TABS Oral Take 20 mg by mouth daily as needed.      Marland Kitchen OLMESARTAN-AMLODIPINE-HCTZ 40-5-12.5 MG PO TABS Oral Take 1 tablet by mouth daily.      Marland Kitchen OMEPRAZOLE 20 MG PO CPDR Oral Take 20 mg by mouth daily.      Marland Kitchen SIMVASTATIN 20 MG PO TABS Oral Take 20 mg by mouth at bedtime.        BP  106/43  Pulse 80  Temp(Src) 98.3 F (36.8 C) (Oral)  Resp 18  SpO2 96%  Physical Exam  Nursing note and vitals reviewed. Constitutional: She appears well-developed and well-nourished.  HENT:  Head: Normocephalic and atraumatic.  Eyes: Conjunctivae are normal. Right eye exhibits no discharge. Left eye exhibits no discharge.  Neck: Normal range of motion. Neck supple.  Cardiovascular: Normal rate, regular rhythm and normal heart sounds.        No tachycardia  Pulmonary/Chest: Effort normal and breath sounds normal.  Abdominal: Soft. There is tenderness (Mild) in the right lower quadrant. There is no rigidity, no rebound, no guarding, no CVA tenderness, no tenderness at McBurney's point and negative Murphy's sign.  Musculoskeletal: She exhibits no edema and no tenderness.  Neurological: She is alert.  Skin: Skin is warm and dry.  Psychiatric: She has a normal mood and affect.    ED Course  Procedures (including critical care time)  Labs Reviewed  CBC - Abnormal; Notable for the following:    RBC 3.47 (*)    Hemoglobin 9.2 (*)    HCT 27.2 (*)    All other components within normal limits  DIFFERENTIAL - Abnormal; Notable for the following:    Monocytes Relative 13 (*)    Monocytes Absolute 1.3 (*)    All other components within normal limits  COMPREHENSIVE METABOLIC PANEL - Abnormal; Notable for the following:    BUN 44 (*)    Creatinine, Ser 1.95 (*)    GFR calc non Af Amer 22 (*)    GFR calc Af Amer 26 (*)    All other components within normal limits  URINALYSIS, ROUTINE W REFLEX MICROSCOPIC - Abnormal; Notable for the following:    Protein, ur 100 (*)    Leukocytes, UA SMALL (*)    All other components within normal limits  PROTIME-INR - Abnormal; Notable for the following:    Prothrombin Time 18.3 (*)    All other components within normal limits  APTT  OCCULT BLOOD, POC DEVICE  URINE MICROSCOPIC-ADD ON   No results found.   1. GI bleed     4:37 PM Patient  seen and examined. Work-up initiated.  Vital signs reviewed and are as follows: Filed Vitals:   10/30/11 1607  BP: 106/43  Pulse: 80  Temp: 98.3 F (36.8 C)  Resp: 18   8:10 PM Patient seen by Dr. Alvino Chapel. Patient states she is feeling well and wants to go home. She has good PCP follow-up and agrees to follow-up on Monday.  Patient has ambulated without dizziness or SOB. Patient instructed to take Nexium bid for next 3 days. She is to follow-up with Dr. Mellody Drown on Monday (2 days).   Patient given strict return instructions including increasing amount of stool or bleeding,  shortness of breath with exertion, lightheadedness with standing, other concerns. Family agrees to watch over patient and ensure follow-up.   Patient to not take coumadin until instructed otherwise by her doctor.    MDM  Patient with upper GI bleed and ASYMPTOMATIC anemia. Patient is requesting discharge home. She stopped anticoagulation yesterday. She is on PPIs. She has PCP follow-up and can do so in two days. She has ambulated without difficulty. Strict return instructions given and patient is to return if she has an symptoms related to anemia.         Carlisle Cater, [place] 10/30/11 2053

## 2011-10-30 NOTE — ED Notes (Signed)
Pt ambulated without difficulty or assistance to bathroom.

## 2011-10-30 NOTE — ED Notes (Signed)
Pt from home with reports of dark stool and RLQ pain that started on Monday. Pt endorses seeing PCP on Thursday for same and was taken off Coumadin and Aspirin yesterday by PCP but symptoms remain so pt came to ED.

## 2011-10-31 NOTE — ED Provider Notes (Signed)
Medical screening examination/treatment/procedure(s) were conducted as a shared visit with non-physician practitioner(s) and myself.  I personally evaluated the patient during the encounter. Patient with GI bleed and has been on Coumadin. She is well-appearing his been able ambulate. Her hemoglobin is stable and her INR is decreased. She'll followup with her primary care doctor on Monday. She will return for any increased bleeding.  Jasper Riling. Alvino Chapel, MD 10/31/11 TD:[id number]

## 2011-11-02 ENCOUNTER — Telehealth: Payer: Self-pay | Admitting: *Deleted

## 2011-11-02 NOTE — Telephone Encounter (Signed)
per orders from 10-28-2011 moved patient to [employer] schedule same time and same day

## 2011-11-10 ENCOUNTER — Other Ambulatory Visit (HOSPITAL_BASED_OUTPATIENT_CLINIC_OR_DEPARTMENT_OTHER): Payer: Medicare Other | Admitting: Lab

## 2011-11-10 ENCOUNTER — Telehealth: Payer: Self-pay | Admitting: Hematology and Oncology

## 2011-11-10 ENCOUNTER — Ambulatory Visit (HOSPITAL_BASED_OUTPATIENT_CLINIC_OR_DEPARTMENT_OTHER): Payer: Medicare Other | Admitting: Nurse Practitioner

## 2011-11-10 ENCOUNTER — Encounter: Payer: Self-pay | Admitting: *Deleted

## 2011-11-10 VITALS — BP 134/66 | HR 78 | Temp 97.0°F | Ht 63.5 in | Wt 126.2 lb

## 2011-11-10 DIAGNOSIS — D638 Anemia in other chronic diseases classified elsewhere: Secondary | ICD-10-CM

## 2011-11-10 DIAGNOSIS — D649 Anemia, unspecified: Secondary | ICD-10-CM

## 2011-11-10 DIAGNOSIS — N189 Chronic kidney disease, unspecified: Secondary | ICD-10-CM

## 2011-11-10 DIAGNOSIS — D509 Iron deficiency anemia, unspecified: Secondary | ICD-10-CM

## 2011-11-10 LAB — CBC WITH DIFFERENTIAL/PLATELET
Basophils Absolute: 0.1 10*3/uL (ref 0.0–0.1)
EOS%: 1.6 % (ref 0.0–7.0)
HCT: 28.1 % — ABNORMAL LOW (ref 34.8–46.6)
HGB: 9.4 g/dL — ABNORMAL LOW (ref 11.6–15.9)
LYMPH%: 19.7 % (ref 14.0–49.7)
MCH: 26 pg (ref 25.1–34.0)
MCV: 77.8 fL — ABNORMAL LOW (ref 79.5–101.0)
MONO%: 15 % — ABNORMAL HIGH (ref 0.0–14.0)
NEUT%: 62.8 % (ref 38.4–76.8)
Platelets: 216 10*3/uL (ref 145–400)
RDW: 16.1 % — ABNORMAL HIGH (ref 11.2–14.5)

## 2011-11-10 NOTE — Progress Notes (Signed)
CC:   Pam [name], M.D.        Pam [name], M.D.  IDENTIFYING STATEMENT:  Patient is an 78 year old woman with iron- deficiency anemia, anemia of chronic disease who presents for followup.  INTERIM HISTORY:  Pam [name] continues to tolerate Aranesp. She was recently seen in the Emergency Dept at [facility] on June 1 for GI bleeding and has a follow up appt with Pam [name] on 11/16/2011. Since her discharge from ED she has had no further complaints. She specifically denies shortness of breath. She states she was taken off of Coumadin and baby aspirin due to the bleeding.  Today's blood count notes a white cell count of 8.9, hemoglobin 9.4, hematocrit 28.1, platelets 216.  MEDICATIONS:  Aranesp 300 mcg monthly.  Integra Plus 1 daily. Rest of medication list reviewed and updated. She was recently taken off of Coumadin and baby Aspirin.  ALLERGIES:  None.  PHYSICAL EXAM:  The patient is a well-appearing, well-nourished woman in no distress.  Vitals:  Pulse 59, blood pressure 172/79, temperature 97, respirations 20, weight 126.2 pounds.  HEENT:  Head is atraumatic, normocephalic.  Sclerae anicteric.  Extraocular movements intact.Mouth moist. Pt wears corrective lenses. Cardiac: S1 S2, systolic murmur noted w/ mild irregularity. This is not new for her.  Chest:  Clear. Abdomen:  Soft. Nontender. No hepatosplenomegaly noted.   Extremities:  No edema.  LAB DATA:  CBC as above.  Anemia panel pending.  B-MET pending.  IMPRESSION AND PLAN:  Pam [name] is an 78 year old woman with iron- deficiency anemia and anemia of chronic disease with underlying chronic kidney disease.  Her hemoglobin is being supported with Aranesp every month.   We will adjust her schedule to receive aranesp monthly.  She follows up in 9 month's time. Plan discussed with Pam [name].     ______________________________ Pam Ke, NP-C

## 2011-11-10 NOTE — Telephone Encounter (Signed)
Gave pt appt calendar  for Aranesp q month with lab until March 2014 lab and MD with injection

## 2011-11-15 ENCOUNTER — Encounter: Payer: Self-pay | Admitting: *Deleted

## 2011-11-16 ENCOUNTER — Ambulatory Visit (INDEPENDENT_AMBULATORY_CARE_PROVIDER_SITE_OTHER): Payer: Medicare Other | Admitting: Gastroenterology

## 2011-11-16 ENCOUNTER — Encounter: Payer: Self-pay | Admitting: Gastroenterology

## 2011-11-16 VITALS — BP 144/60 | HR 70 | Ht 63.0 in | Wt 127.0 lb

## 2011-11-16 DIAGNOSIS — I4891 Unspecified atrial fibrillation: Secondary | ICD-10-CM

## 2011-11-16 DIAGNOSIS — G8929 Other chronic pain: Secondary | ICD-10-CM

## 2011-11-16 DIAGNOSIS — D649 Anemia, unspecified: Secondary | ICD-10-CM

## 2011-11-16 DIAGNOSIS — R195 Other fecal abnormalities: Secondary | ICD-10-CM

## 2011-11-16 DIAGNOSIS — Z8719 Personal history of other diseases of the digestive system: Secondary | ICD-10-CM

## 2011-11-16 DIAGNOSIS — I739 Peripheral vascular disease, unspecified: Secondary | ICD-10-CM

## 2011-11-16 DIAGNOSIS — R1031 Right lower quadrant pain: Secondary | ICD-10-CM

## 2011-11-16 MED ORDER — MOVIPREP 100 G PO SOLR: ORAL | Status: DC

## 2011-11-16 NOTE — Patient Instructions (Addendum)
You have been scheduled for an endoscopy and colonoscopy with propofol. Please follow the written instructions given to you at your visit today. Please pick up your prep at the pharmacy within the next 1-3 days. CC: Dr [name]

## 2011-11-16 NOTE — Progress Notes (Signed)
This is a very complicated 78 year old African American female with recurrent iron deficiency anemia and guaiac positive stools. She previously was on Coumadin and aspirin which is being held at this time with a stable hemoglobin 9.5. She complains of intermittent right sided abdominal pain with occasional dark melanotic stools. She currently is on oral iron replacement therapy. She denies cardiovascular or pulmonary complaints. She is followed closely by Dr. Mellody Drown who has done excellent recent workup of the patient on June 10 which was reviewed today. Patient does have peripheral vascular disease, chronic GERD, mild hyperparathyroidism with recurrent kidney stones, hypertension, and atrial fibrillation with previous cardioversion, osteoporosis, and spinal stenosis. She denies alcohol use but does smoke, and denies use of other NSAIDs. Last endoscopy and colonoscopy were 3 years ago. She is on multiple medications listed and reviewed her record. Family history is noncontributory.  Current Medications, Allergies, Past Medical History, Past Surgical History, Family History and Social History were reviewed in [employer] record.  Pertinent Review of Systems Negative   Physical Exam: Healthy patient who appears younger than her stated age in no acute distress. Blood pressure 144/60, pulse 70, and weight 127 pounds with a BMI of 22.5. I cannot appreciate stigmata of chronic liver disease. Her chest is clear and she does not have significant murmurs gallops or rubs with an irregular pulse. I cannot appreciate hepatosplenomegaly, abdominal masses or tenderness. There is some asymmetry of her abdomen with a slight bulge in the right mid abdominal area. Inspection of the rectum is unremarkable with normal color stool which is guaiac negative. Mental status is normal. There is no peripheral edema, phlebitis, or swollen joints.    Assessment and Plan: I'm concerned about her right lower  quadrant pain, guaiac positive stools and continued anemia, and have scheduled followup colonoscopy ASAP. If this is unremarkable, I would recommend pill camera video exam of her gut. She is to continue oral iron therapy and other medications as per primary care. There is no familial history of colon carcinoma. Patient appears stable from a cardiovascular standpoint at this time. She denies reflux symptoms and is on omeprazole 40 mg a day. Encounter Diagnoses  Name Primary?  . Heme positive stool Yes  . Anemia

## 2011-11-26 ENCOUNTER — Telehealth: Payer: Self-pay | Admitting: *Deleted

## 2011-11-26 ENCOUNTER — Ambulatory Visit (AMBULATORY_SURGERY_CENTER): Payer: Medicare Other | Admitting: Gastroenterology

## 2011-11-26 ENCOUNTER — Encounter: Payer: Self-pay | Admitting: Gastroenterology

## 2011-11-26 VITALS — BP 158/56 | HR 62 | Temp 95.2°F | Resp 16 | Ht 63.0 in | Wt 127.0 lb

## 2011-11-26 DIAGNOSIS — K5730 Diverticulosis of large intestine without perforation or abscess without bleeding: Secondary | ICD-10-CM

## 2011-11-26 DIAGNOSIS — R195 Other fecal abnormalities: Secondary | ICD-10-CM

## 2011-11-26 DIAGNOSIS — K625 Hemorrhage of anus and rectum: Secondary | ICD-10-CM

## 2011-11-26 DIAGNOSIS — D649 Anemia, unspecified: Secondary | ICD-10-CM

## 2011-11-26 DIAGNOSIS — K922 Gastrointestinal hemorrhage, unspecified: Secondary | ICD-10-CM

## 2011-11-26 DIAGNOSIS — R933 Abnormal findings on diagnostic imaging of other parts of digestive tract: Secondary | ICD-10-CM

## 2011-11-26 MED ORDER — SODIUM CHLORIDE 0.9 % IV SOLN: 500.0000 mL | INTRAVENOUS | Status: DC

## 2011-11-26 NOTE — Progress Notes (Signed)
ATTEMPTED TO REACH CECUM X 23 MINUTES. CHANGED POSITION SEVERAL TIME. ABDOMINAL PRESSURE. UNSUCCESSFUL, PROCEDURE ABORTED.EGD CANCELLED

## 2011-11-26 NOTE — Op Note (Signed)
[facility] [address] [place], [place]  [id number]  COLONOSCOPY PROCEDURE REPORT  PATIENT:  Pam, [name]  MR#:  [id number] BIRTHDATE:  12-23-1925, 86 yrs. old  GENDER:  female ENDOSCOPIST:  Loralee Pacas. Sharlett Iles, MD, [facility] REF. BY:  [name], M.D. PROCEDURE DATE:  11/26/2011 PROCEDURE:  Incomplete colonoscopy ASA CLASS:  Class III INDICATIONS:  Abnormal weight loss, Colorectal cancer screening, average risk, Anemia, FOBT positive stool MEDICATIONS:   propofol (Diprivan) 300 mg IV  DESCRIPTION OF PROCEDURE:   After the risks and benefits and of the procedure were explained, informed consent was obtained. Digital rectal exam was performed and revealed no abnormalities. The LB [id number]-[id number] endoscope was introduced through the anus and advanced to the cecum, which was identified by both the appendix and ileocecal valve.  The quality of the prep was adequate, using MoviPrep.  The instrument was then slowly withdrawn as the colon was fully examined. <<PROCEDUREIMAGES>>  FINDINGS:  Severe diverticulosis was found in the sigmoid to descending colon segments.  incomplete exam. COULD NOT PASS TWISTED COLON IN HEPATIC FLEXURE AREA DESPITE PEDIATRICCOPE USE.VERY,VERY DIFFICULT EXAM.   Retroflexed views in the rectum revealed no abnormalities.    The scope was then withdrawn from the patient and the procedure completed.  COMPLICATIONS:  None ENDOSCOPIC IMPRESSION: 1) Severe diverticulosis in the sigmoid to descending colon segments 2) Incomplete exam VERY REDUNDANT COLON AND SEVERE DIVERTICULOSIS.?? RIGHT COLON OBSTRUCTION. RECOMMENDATIONS: CT SCAN NEXT WEEK.  REPEAT EXAM:  No  ______________________________ Loralee Pacas. Sharlett Iles, MD, [facility]  CC:  n. eSIGNED:   Loralee Pacas. Braylea Brancato at 11/26/2011 04:17 PM  [name], [id number]

## 2011-11-26 NOTE — Patient Instructions (Addendum)
Discharge instructions given with verbal understanding. Handouts on diverticulosis a high fiber diet given. Resume previous medications. YOU HAD AN ENDOSCOPIC PROCEDURE TODAY AT [facility]: Refer to the procedure report that was given to you for any specific questions about what was found during the examination.  If the procedure report does not answer your questions, please call your gastroenterologist to clarify.  If you requested that your care partner not be given the details of your procedure findings, then the procedure report has been included in a sealed envelope for you to review at your convenience later.  YOU SHOULD EXPECT: Some feelings of bloating in the abdomen. Passage of more gas than usual.  Walking can help get rid of the air that was put into your GI tract during the procedure and reduce the bloating. If you had a lower endoscopy (such as a colonoscopy or flexible sigmoidoscopy) you may notice spotting of blood in your stool or on the toilet paper. If you underwent a bowel prep for your procedure, then you may not have a normal bowel movement for a few days.  DIET: Your first meal following the procedure should be a light meal and then it is ok to progress to your normal diet.  A half-sandwich or bowl of soup is an example of a good first meal.  Heavy or fried foods are harder to digest and may make you feel nauseous or bloated.  Likewise meals heavy in dairy and vegetables can cause extra gas to form and this can also increase the bloating.  Drink plenty of fluids but you should avoid alcoholic beverages for 24 hours.  ACTIVITY: Your care partner should take you home directly after the procedure.  You should plan to take it easy, moving slowly for the rest of the day.  You can resume normal activity the day after the procedure however you should NOT DRIVE or use heavy machinery for 24 hours (because of the sedation medicines used during the test).    SYMPTOMS TO  REPORT IMMEDIATELY: A gastroenterologist can be reached at any hour.  During normal business hours, 8:30 AM to 5:00 PM Monday through Friday, call [phone].  After hours and on weekends, please call the GI answering service at [phone] who will take a message and have the physician on call contact you.   Following lower endoscopy (colonoscopy or flexible sigmoidoscopy):  Excessive amounts of blood in the stool  Significant tenderness or worsening of abdominal pains  Swelling of the abdomen that is new, acute  Fever of 100F or higher  FOLLOW UP: If any biopsies were taken you will be contacted by phone or by letter within the next 1-3 weeks.  Call your gastroenterologist if you have not heard about the biopsies in 3 weeks.  Our staff will call the home number listed on your records the next business day following your procedure to check on you and address any questions or concerns that you may have at that time regarding the information given to you following your procedure. This is a courtesy call and so if there is no answer at the home number and we have not heard from you through the emergency physician on call, we will assume that you have returned to your regular daily activities without incident.  SIGNATURES/CONFIDENTIALITY: You and/or your care partner have signed paperwork which will be entered into your electronic medical record.  These signatures attest to the fact that that the information above on your  After Visit Summary has been reviewed and is understood.  Full responsibility of the confidentiality of this discharge information lies with you and/or your care-partner.

## 2011-11-26 NOTE — Telephone Encounter (Signed)
Scheduled pt for CT on 11/29/11 at 11:30am. Gave pt/family her prep sheet and contrast in [place].

## 2011-11-26 NOTE — Progress Notes (Signed)
Patient did not experience any of the following events: a burn prior to discharge; a fall within the facility; wrong site/side/patient/procedure/implant event; or a hospital transfer or hospital admission upon discharge from the facility. (G8907) Patient did not have preoperative order for IV antibiotic SSI prophylaxis. (G8918)  

## 2011-11-29 ENCOUNTER — Telehealth: Payer: Self-pay

## 2011-11-29 ENCOUNTER — Ambulatory Visit (INDEPENDENT_AMBULATORY_CARE_PROVIDER_SITE_OTHER)
Admission: RE | Admit: 2011-11-29 | Discharge: 2011-11-29 | Disposition: A | Discharge status: Home / Self Care | Payer: Medicare Other | Source: Ambulatory Visit | Attending: Gastroenterology | Admitting: Gastroenterology

## 2011-11-29 DIAGNOSIS — K625 Hemorrhage of anus and rectum: Secondary | ICD-10-CM

## 2011-11-29 DIAGNOSIS — R933 Abnormal findings on diagnostic imaging of other parts of digestive tract: Secondary | ICD-10-CM

## 2011-11-29 MED ORDER — IOHEXOL 300 MG/ML  SOLN
75.0000 mL | Freq: Once | INTRAMUSCULAR | Status: AC | PRN
  Administered 2011-11-29: 75 mL via INTRAVENOUS

## 2011-11-29 NOTE — Telephone Encounter (Signed)
  Follow up Call-  Call back number 11/26/2011  Post procedure Call Back phone  # [phone]  Permission to leave phone message Yes     Patient questions:  Do you have a fever, pain , or abdominal swelling? no Pain Score  0 *  Have you tolerated food without any problems? yes  Have you been able to return to your normal activities? yes  Do you have any questions about your discharge instructions: Diet   no Medications  no Follow up visit  no  Do you have questions or concerns about your Care? no  Actions: * If pain score is 4 or above: No action needed, pain <4.

## 2011-11-30 ENCOUNTER — Ambulatory Visit (AMBULATORY_SURGERY_CENTER): Payer: Medicare Other

## 2011-11-30 ENCOUNTER — Encounter: Payer: Self-pay | Admitting: Gastroenterology

## 2011-11-30 VITALS — Ht 63.0 in | Wt 128.0 lb

## 2011-11-30 DIAGNOSIS — IMO0001 Reserved for inherently not codable concepts without codable children: Secondary | ICD-10-CM

## 2011-11-30 DIAGNOSIS — R933 Abnormal findings on diagnostic imaging of other parts of digestive tract: Secondary | ICD-10-CM

## 2011-11-30 DIAGNOSIS — R143 Flatulence: Secondary | ICD-10-CM

## 2011-12-08 ENCOUNTER — Other Ambulatory Visit (HOSPITAL_BASED_OUTPATIENT_CLINIC_OR_DEPARTMENT_OTHER): Payer: Medicare Other | Admitting: Lab

## 2011-12-08 ENCOUNTER — Ambulatory Visit (HOSPITAL_BASED_OUTPATIENT_CLINIC_OR_DEPARTMENT_OTHER): Payer: Medicare Other

## 2011-12-08 VITALS — BP 149/72 | HR 56 | Temp 97.7°F

## 2011-12-08 DIAGNOSIS — D649 Anemia, unspecified: Secondary | ICD-10-CM

## 2011-12-08 DIAGNOSIS — N289 Disorder of kidney and ureter, unspecified: Secondary | ICD-10-CM

## 2011-12-08 LAB — CBC WITH DIFFERENTIAL/PLATELET
BASO%: 1.1 % (ref 0.0–2.0)
Eosinophils Absolute: 0.2 10*3/uL (ref 0.0–0.5)
HCT: 30.2 % — ABNORMAL LOW (ref 34.8–46.6)
MCHC: 33.8 g/dL (ref 31.5–36.0)
MONO#: 0.8 10*3/uL (ref 0.1–0.9)
NEUT#: 4.3 10*3/uL (ref 1.5–6.5)
RBC: 3.95 10*6/uL (ref 3.70–5.45)
WBC: 7.1 10*3/uL (ref 3.9–10.3)
lymph#: 1.8 10*3/uL (ref 0.9–3.3)
nRBC: 0 % (ref 0–0)

## 2011-12-08 MED ORDER — DARBEPOETIN ALFA-POLYSORBATE 300 MCG/0.6ML IJ SOLN
300.0000 ug | Freq: Once | INTRAMUSCULAR | Status: AC
  Administered 2011-12-08: 300 ug via SUBCUTANEOUS
  Filled 2011-12-08: qty 0.6

## 2011-12-08 NOTE — Progress Notes (Signed)
Pulse low @ 43 with automatic BP machine.  Rechecked pulse manually is 56.  Aranesp 332mcg given today for HGB 10.2.

## 2011-12-08 NOTE — Patient Instructions (Signed)
Call MD for problems 

## 2011-12-10 ENCOUNTER — Ambulatory Visit: Payer: Medicare Other

## 2011-12-13 ENCOUNTER — Ambulatory Visit (AMBULATORY_SURGERY_CENTER): Payer: Medicare Other | Admitting: Gastroenterology

## 2011-12-13 ENCOUNTER — Encounter: Payer: Self-pay | Admitting: Gastroenterology

## 2011-12-13 VITALS — BP 167/81 | HR 59 | Temp 95.3°F | Resp 19 | Ht 63.0 in | Wt 128.0 lb

## 2011-12-13 DIAGNOSIS — R935 Abnormal findings on diagnostic imaging of other abdominal regions, including retroperitoneum: Secondary | ICD-10-CM

## 2011-12-13 DIAGNOSIS — R143 Flatulence: Secondary | ICD-10-CM

## 2011-12-13 DIAGNOSIS — R195 Other fecal abnormalities: Secondary | ICD-10-CM

## 2011-12-13 DIAGNOSIS — R933 Abnormal findings on diagnostic imaging of other parts of digestive tract: Secondary | ICD-10-CM

## 2011-12-13 DIAGNOSIS — R141 Gas pain: Secondary | ICD-10-CM

## 2011-12-13 DIAGNOSIS — D649 Anemia, unspecified: Secondary | ICD-10-CM

## 2011-12-13 MED ORDER — SODIUM CHLORIDE 0.9 % IV SOLN: 500.0000 mL | INTRAVENOUS | Status: DC

## 2011-12-13 NOTE — Op Note (Signed)
[address] [place], [place]  [address]  ENDOSCOPY PROCEDURE REPORT  PATIENT:  [name], Pam  MR#:  [id number] BIRTHDATE:  [birth year], 86 yrs. old  GENDER:  female  ENDOSCOPIST:  Loralee Pacas. Sharlett Iles, MD, [facility] Referred by:  PROCEDURE DATE:  12/13/2011 PROCEDURE:  EGD, diagnostic 43235 ASA CLASS:  Class III INDICATIONS:  abdominal pain, iron deficiency anemia, hemoccult positive stool  MEDICATIONS:   propofol (Diprivan) 100 mg IV, glycopyrrolate (Robinal) 0.1 mg IV TOPICAL ANESTHETIC:  DESCRIPTION OF PROCEDURE:   After the risks and benefits of the procedure were explained, informed consent was obtained.  [facility] [id number] endoscope was introduced through the mouth and advanced to the second portion of the duodenum.  The instrument was slowly withdrawn as the mucosa was fully examined. <<PROCEDUREIMAGES>>  The upper, middle, and distal third of the esophagus were carefully inspected and no abnormalities were noted. The z-line was well seen at the GEJ. The endoscope was pushed into the fundus which was normal including a retroflexed view. The antrum,gastric body, first and second part of the duodenum were unremarkable. no bleeding.STOMACH APPEARS NORMAL!!!    Retroflexed views revealed no abnormalities.    The scope was then withdrawn from the patient and the procedure completed.  COMPLICATIONS:  None  ENDOSCOPIC IMPRESSION: 1) Normal EGD OUTPATIENT PILL CAMERA EXAM. RECOMMENDATIONS: PILL CAMERAQ ENTEROSCOPY EXAM TO BE SCHEDULED  ______________________________ Loralee Pacas. Sharlett Iles, MD, [facility]  CC:  [name] MD  n. Lorrin MaisLoralee Pacas. Patterson at 12/13/2011 02:57 PM  [name], [id number]

## 2011-12-13 NOTE — Patient Instructions (Addendum)
COME TO DR. PATTERSON'S OFFICE ON July 17,2013, Wednesday, ARRIVE AT 2 PM TO DISCUSS CAPSULE ENDOSCOPY. CONTINUE YOUR MEDICATIONS.YOU HAD AN ENDOSCOPIC PROCEDURE TODAY AT [facility]: Refer to the procedure report that was given to you for any specific questions about what was found during the examination.  If the procedure report does not answer your questions, please call your gastroenterologist to clarify.  If you requested that your care partner not be given the details of your procedure findings, then the procedure report has been included in a sealed envelope for you to review at your convenience later.  YOU SHOULD EXPECT: Some feelings of bloating in the abdomen. Passage of more gas than usual.  Walking can help get rid of the air that was put into your GI tract during the procedure and reduce the bloating. If you had a lower endoscopy (such as a colonoscopy or flexible sigmoidoscopy) you may notice spotting of blood in your stool or on the toilet paper. If you underwent a bowel prep for your procedure, then you may not have a normal bowel movement for a few days.  DIET: Your first meal following the procedure should be a light meal and then it is ok to progress to your normal diet.  A half-sandwich or bowl of soup is an example of a good first meal.  Heavy or fried foods are harder to digest and may make you feel nauseous or bloated.  Likewise meals heavy in dairy and vegetables can cause extra gas to form and this can also increase the bloating.  Drink plenty of fluids but you should avoid alcoholic beverages for 24 hours.  ACTIVITY: Your care partner should take you home directly after the procedure.  You should plan to take it easy, moving slowly for the rest of the day.  You can resume normal activity the day after the procedure however you should NOT DRIVE or use heavy machinery for 24 hours (because of the sedation medicines used during the test).    SYMPTOMS TO REPORT  IMMEDIATELY: A gastroenterologist can be reached at any hour.  During normal business hours, 8:30 AM to 5:00 PM Monday through Friday, call [phone].  After hours and on weekends, please call the GI answering service at [phone] who will take a message and have the physician on call contact you.   Following lower endoscopy (colonoscopy or flexible sigmoidoscopy):  Excessive amounts of blood in the stool  Significant tenderness or worsening of abdominal pains  Swelling of the abdomen that is new, acute  Fever of 100F or higher  Following upper endoscopy (EGD)  Vomiting of blood or coffee ground material  New chest pain or pain under the shoulder blades  Painful or persistently difficult swallowing  New shortness of breath  Fever of 100F or higher  Black, tarry-looking stools  FOLLOW UP: If any biopsies were taken you will be contacted by phone or by letter within the next 1-3 weeks.  Call your gastroenterologist if you have not heard about the biopsies in 3 weeks.  Our staff will call the home number listed on your records the next business day following your procedure to check on you and address any questions or concerns that you may have at that time regarding the information given to you following your procedure. This is a courtesy call and so if there is no answer at the home number and we have not heard from you through the emergency physician on call, we  will assume that you have returned to your regular daily activities without incident.  SIGNATURES/CONFIDENTIALITY: You and/or your care partner have signed paperwork which will be entered into your electronic medical record.  These signatures attest to the fact that that the information above on your After Visit Summary has been reviewed and is understood.  Full responsibility of the confidentiality of this discharge information lies with you and/or your care-partner.

## 2011-12-13 NOTE — Progress Notes (Signed)
Patient did not experience any of the following events: a burn prior to discharge; a fall within the facility; wrong site/side/patient/procedure/implant event; or a hospital transfer or hospital admission upon discharge from the facility. (G8907) Patient did not have preoperative order for IV antibiotic SSI prophylaxis. (G8918)  

## 2011-12-14 ENCOUNTER — Telehealth: Payer: Self-pay | Admitting: *Deleted

## 2011-12-14 NOTE — Telephone Encounter (Signed)
  Follow up Call-  Call back number 12/13/2011 11/26/2011  Post procedure Call Back phone  # [phone]  Permission to leave phone message Yes Yes     Patient questions:  Do you have a fever, pain , or abdominal swelling? no Pain Score  0 *  Have you tolerated food without any problems? yes  Have you been able to return to your normal activities? yes  Do you have any questions about your discharge instructions: Diet   no Medications  no Follow up visit  no  Do you have questions or concerns about your Care? no  Actions: * If pain score is 4 or above: No action needed, pain <4.

## 2011-12-15 ENCOUNTER — Other Ambulatory Visit: Payer: Self-pay | Admitting: *Deleted

## 2011-12-15 ENCOUNTER — Telehealth: Payer: Self-pay | Admitting: *Deleted

## 2011-12-15 DIAGNOSIS — E611 Iron deficiency: Secondary | ICD-10-CM

## 2011-12-15 DIAGNOSIS — K921 Melena: Secondary | ICD-10-CM

## 2011-12-15 NOTE — Telephone Encounter (Signed)
Patient came for capsule endo teaching. Patient given written and verbal instructions. Patient also shown capsule and equipment. Scheduled proceudre on 12/27/11 at 8:00

## 2011-12-27 ENCOUNTER — Ambulatory Visit (INDEPENDENT_AMBULATORY_CARE_PROVIDER_SITE_OTHER): Payer: Medicare Other | Admitting: Internal Medicine

## 2011-12-27 DIAGNOSIS — R195 Other fecal abnormalities: Secondary | ICD-10-CM

## 2011-12-27 DIAGNOSIS — D500 Iron deficiency anemia secondary to blood loss (chronic): Secondary | ICD-10-CM

## 2011-12-27 NOTE — Progress Notes (Signed)
Patient here for capsule endoscopy.  She tolerated the procedure well.  Lot # [id number] , capsule ID [id number].

## 2011-12-30 ENCOUNTER — Encounter: Payer: Self-pay | Admitting: Internal Medicine

## 2012-01-05 ENCOUNTER — Other Ambulatory Visit (HOSPITAL_BASED_OUTPATIENT_CLINIC_OR_DEPARTMENT_OTHER): Payer: Medicare Other | Admitting: Lab

## 2012-01-05 ENCOUNTER — Ambulatory Visit (HOSPITAL_BASED_OUTPATIENT_CLINIC_OR_DEPARTMENT_OTHER): Payer: Medicare Other

## 2012-01-05 VITALS — BP 146/64 | HR 68 | Temp 97.7°F

## 2012-01-05 DIAGNOSIS — N289 Disorder of kidney and ureter, unspecified: Secondary | ICD-10-CM

## 2012-01-05 DIAGNOSIS — D649 Anemia, unspecified: Secondary | ICD-10-CM

## 2012-01-05 LAB — CBC WITH DIFFERENTIAL/PLATELET
Basophils Absolute: 0.1 10*3/uL (ref 0.0–0.1)
Eosinophils Absolute: 0.1 10*3/uL (ref 0.0–0.5)
HGB: 10.4 g/dL — ABNORMAL LOW (ref 11.6–15.9)
MONO#: 0.8 10*3/uL (ref 0.1–0.9)
NEUT#: 4.1 10*3/uL (ref 1.5–6.5)
RDW: 15.9 % — ABNORMAL HIGH (ref 11.2–14.5)
lymph#: 1.8 10*3/uL (ref 0.9–3.3)

## 2012-01-05 MED ORDER — DARBEPOETIN ALFA-POLYSORBATE 500 MCG/ML IJ SOLN
300.0000 ug | Freq: Once | INTRAMUSCULAR | Status: AC
  Administered 2012-01-05: 300 ug via SUBCUTANEOUS
  Filled 2012-01-05: qty 1

## 2012-01-07 ENCOUNTER — Ambulatory Visit: Payer: Medicare Other

## 2012-01-10 HISTORY — PX: OTHER SURGICAL HISTORY: SHX169

## 2012-01-12 ENCOUNTER — Other Ambulatory Visit: Payer: Medicare Other | Admitting: Lab

## 2012-02-02 ENCOUNTER — Telehealth: Payer: Self-pay | Admitting: Gastroenterology

## 2012-02-02 DIAGNOSIS — R1031 Right lower quadrant pain: Secondary | ICD-10-CM

## 2012-02-02 DIAGNOSIS — R195 Other fecal abnormalities: Secondary | ICD-10-CM

## 2012-02-02 DIAGNOSIS — D649 Anemia, unspecified: Secondary | ICD-10-CM

## 2012-02-02 NOTE — Telephone Encounter (Signed)
Ct scan enterography...radiology

## 2012-02-02 NOTE — Telephone Encounter (Signed)
Dr Sharlett Iles, can you interpret the Capsule Endo results for pt? Thanks.

## 2012-02-02 NOTE — Telephone Encounter (Signed)
Do you do this upstairs or do schedule it at the hospital? Thanks.

## 2012-02-02 NOTE — Telephone Encounter (Signed)
??   Small bowel polyps...needs ct enterography.Marland KitchenMarland Kitchen

## 2012-02-04 NOTE — Telephone Encounter (Signed)
Spouse called back on an encounter in his name, Pam [name] MRN [id number], and I went over the NPO status, appt time, location, reason for the test, etc. Pam [name] stated understanding and will call back for questions.

## 2012-02-04 NOTE — Telephone Encounter (Signed)
Pt called back for the time of the procedure and I instructed her on the prep- NPO status- and the fact she needs to have labs drawn. Pt stated understanding and will  Have her husband call me back.

## 2012-02-04 NOTE — Telephone Encounter (Signed)
Informed pt she needs further testing for a questionable Cap Endo. Informed of CT scan appt at [facility] on 02/08/12 at 1:30pm; NPO 4hr prior. Pt reports she has an appt on that day, and I asked that she contact Rose to r/s to alleviate multiple phone calls. I asked if there was a relative I could talk to about this so someone else will be informed and she replied her husband. Pt will have her husband call me back.

## 2012-02-07 ENCOUNTER — Telehealth: Payer: Self-pay | Admitting: *Deleted

## 2012-02-07 ENCOUNTER — Other Ambulatory Visit (INDEPENDENT_AMBULATORY_CARE_PROVIDER_SITE_OTHER): Payer: Medicare Other

## 2012-02-07 DIAGNOSIS — D649 Anemia, unspecified: Secondary | ICD-10-CM

## 2012-02-07 DIAGNOSIS — R1031 Right lower quadrant pain: Secondary | ICD-10-CM

## 2012-02-07 DIAGNOSIS — R195 Other fecal abnormalities: Secondary | ICD-10-CM

## 2012-02-07 LAB — COMPREHENSIVE METABOLIC PANEL
Albumin: 3.6 g/dL (ref 3.5–5.2)
Alkaline Phosphatase: 79 U/L (ref 39–117)
CO2: 24 mEq/L (ref 19–32)
Chloride: 107 mEq/L (ref 96–112)
GFR: 37.29 mL/min — ABNORMAL LOW (ref >60.00)
Glucose, Bld: 101 mg/dL — ABNORMAL HIGH (ref 70–99)
Potassium: 4.4 mEq/L (ref 3.5–5.1)
Sodium: 139 mEq/L (ref 135–145)
Total Protein: 7 g/dL (ref 6.0–8.3)

## 2012-02-07 NOTE — Telephone Encounter (Signed)
Spoke with pt and informed her Dr Sharlett Iles has cancelled her CT scan, partly d/t her renal clearance, and would like to see her instead. Mailed appt info to pt; pt stated understanding.

## 2012-02-08 ENCOUNTER — Other Ambulatory Visit: Payer: Medicare Other

## 2012-02-09 ENCOUNTER — Ambulatory Visit (HOSPITAL_BASED_OUTPATIENT_CLINIC_OR_DEPARTMENT_OTHER): Payer: Medicare Other

## 2012-02-09 ENCOUNTER — Other Ambulatory Visit (HOSPITAL_BASED_OUTPATIENT_CLINIC_OR_DEPARTMENT_OTHER): Payer: Medicare Other

## 2012-02-09 VITALS — BP 141/71 | HR 48 | Temp 98.6°F

## 2012-02-09 DIAGNOSIS — D649 Anemia, unspecified: Secondary | ICD-10-CM

## 2012-02-09 DIAGNOSIS — N289 Disorder of kidney and ureter, unspecified: Secondary | ICD-10-CM

## 2012-02-09 LAB — CBC WITH DIFFERENTIAL/PLATELET
BASO%: 1.4 % (ref 0.0–2.0)
LYMPH%: 24.1 % (ref 14.0–49.7)
MCHC: 32.7 g/dL (ref 31.5–36.0)
MONO#: 0.8 10*3/uL (ref 0.1–0.9)
Platelets: 254 10*3/uL (ref 145–400)
RBC: 4.47 10*6/uL (ref 3.70–5.45)
WBC: 6.4 10*3/uL (ref 3.9–10.3)
nRBC: 0 % (ref 0–0)

## 2012-02-09 MED ORDER — DARBEPOETIN ALFA-POLYSORBATE 500 MCG/ML IJ SOLN
300.0000 ug | Freq: Once | INTRAMUSCULAR | Status: AC
  Administered 2012-02-09: 300 ug via SUBCUTANEOUS
  Filled 2012-02-09: qty 1

## 2012-02-11 ENCOUNTER — Ambulatory Visit: Payer: Medicare Other

## 2012-02-14 ENCOUNTER — Other Ambulatory Visit: Payer: Medicare Other

## 2012-02-15 ENCOUNTER — Ambulatory Visit (INDEPENDENT_AMBULATORY_CARE_PROVIDER_SITE_OTHER): Payer: Medicare Other | Admitting: Gastroenterology

## 2012-02-15 ENCOUNTER — Encounter: Payer: Self-pay | Admitting: Gastroenterology

## 2012-02-15 VITALS — BP 150/60 | HR 64 | Ht 63.0 in | Wt 125.6 lb

## 2012-02-15 DIAGNOSIS — D649 Anemia, unspecified: Secondary | ICD-10-CM

## 2012-02-15 DIAGNOSIS — R195 Other fecal abnormalities: Secondary | ICD-10-CM

## 2012-02-15 DIAGNOSIS — K5730 Diverticulosis of large intestine without perforation or abscess without bleeding: Secondary | ICD-10-CM

## 2012-02-15 NOTE — Patient Instructions (Addendum)
You have been scheduled for a barium enema at [facility] on 02/21/12.  Please arrive at 10:15 for a 10:30 appointment.  Please go by the Radiology Department and pick up your prep kit.

## 2012-02-15 NOTE — Progress Notes (Signed)
This is a 78 year old Caucasian female with chronic iron deficiency and chronic guaiac positive stool of unexplained etiology. She recently completed a pill camera exam that per Dr. Hilarie Fredrickson suggested some small intestinal xanthomas without active bleeding. She currently is asymptomatic in terms of any GI complaints. I have reviewed her x-rays and records in detail, and she had an incomplete colonoscopy this past spring with subsequent CT scan which was fairly unremarkable. She is on chronic Nexium 40 mg a day for chronic GERD. Patient previously been on Coumadin for AF which has been discontinued, and her hemoglobin has been stable.  She is on oral iron replacement therapy. The patient's husband is with her throughout the interview and exam today. She denies melena or hematochezia or lower nominal pain. At this time she also denies dyspepsia, dysphagia or any hepatobiliary or systemic complaints.  Current Medications, Allergies, Past Medical History, Past Surgical History, Family History and Social History were reviewed in [employer] record.  Pertinent Review of Systems Negative   Physical Exam: Awake alert no acute distress. Blood pressure 150/60, pulse 64 and regular, and weight 125 pounds. BMI 22.25. I cannot appreciate hepatosplenomegaly, abdominal masses or tenderness. Bowel sounds are normal. Mental status is normal    Assessment and Plan: I have ordered air-contrast barium enema to visualize the right colon and to be complete in her workup. She seems stable at this time on iron and off Coumadin therapy. I reviewed her pill camera exam, and I am reluctant to refer her for small bowel enterography because of her age and nonspecific findings. Colonoscopy in April 2010 was unremarkable. As mentioned above, colonoscopy in June was incomplete because of severe diverticulosis and colonic redundancy. We will continue her medications as listed and reviewed with continued hematology  and primary care followup as scheduled. Encounter Diagnoses  Name Primary?  Marland Kitchen Anemia Yes  . Heme positive stool

## 2012-02-21 ENCOUNTER — Ambulatory Visit (HOSPITAL_COMMUNITY)
Admission: RE | Admit: 2012-02-21 | Discharge: 2012-02-21 | Disposition: A | Discharge status: Home / Self Care | Payer: Medicare Other | Source: Ambulatory Visit | Attending: Gastroenterology | Admitting: Gastroenterology

## 2012-02-21 ENCOUNTER — Other Ambulatory Visit: Payer: Self-pay | Admitting: Gastroenterology

## 2012-02-21 DIAGNOSIS — K5730 Diverticulosis of large intestine without perforation or abscess without bleeding: Secondary | ICD-10-CM | POA: Insufficient documentation

## 2012-02-21 DIAGNOSIS — D649 Anemia, unspecified: Secondary | ICD-10-CM | POA: Insufficient documentation

## 2012-02-21 DIAGNOSIS — R195 Other fecal abnormalities: Secondary | ICD-10-CM

## 2012-03-07 ENCOUNTER — Other Ambulatory Visit: Payer: Self-pay | Admitting: Family

## 2012-03-08 ENCOUNTER — Other Ambulatory Visit (HOSPITAL_BASED_OUTPATIENT_CLINIC_OR_DEPARTMENT_OTHER): Payer: Medicare Other | Admitting: Lab

## 2012-03-08 ENCOUNTER — Ambulatory Visit: Payer: Medicare Other

## 2012-03-08 DIAGNOSIS — D649 Anemia, unspecified: Secondary | ICD-10-CM

## 2012-03-08 LAB — CBC WITH DIFFERENTIAL/PLATELET
BASO%: 1.2 % (ref 0.0–2.0)
Basophils Absolute: 0.1 10*3/uL (ref 0.0–0.1)
EOS%: 1.7 % (ref 0.0–7.0)
HGB: 11.6 g/dL (ref 11.6–15.9)
MCH: 23.6 pg — ABNORMAL LOW (ref 25.1–34.0)
MCHC: 32.7 g/dL (ref 31.5–36.0)
RBC: 4.92 10*6/uL (ref 3.70–5.45)
RDW: 19.4 % — ABNORMAL HIGH (ref 11.2–14.5)
lymph#: 2 10*3/uL (ref 0.9–3.3)
nRBC: 0 % (ref 0–0)

## 2012-03-08 MED ORDER — DARBEPOETIN ALFA-POLYSORBATE 300 MCG/0.6ML IJ SOLN: 300.0000 ug | Freq: Once | INTRAMUSCULAR | Status: DC

## 2012-03-10 ENCOUNTER — Ambulatory Visit: Payer: Medicare Other

## 2012-04-05 ENCOUNTER — Ambulatory Visit: Payer: Medicare Other

## 2012-04-05 ENCOUNTER — Other Ambulatory Visit (HOSPITAL_BASED_OUTPATIENT_CLINIC_OR_DEPARTMENT_OTHER): Payer: Medicare Other | Admitting: Lab

## 2012-04-05 DIAGNOSIS — D649 Anemia, unspecified: Secondary | ICD-10-CM

## 2012-04-05 LAB — CBC WITH DIFFERENTIAL/PLATELET
BASO%: 1.1 % (ref 0.0–2.0)
Eosinophils Absolute: 0.2 10*3/uL (ref 0.0–0.5)
MONO#: 0.8 10*3/uL (ref 0.1–0.9)
NEUT#: 4.7 10*3/uL (ref 1.5–6.5)
RBC: 4.41 10*6/uL (ref 3.70–5.45)
RDW: 19.3 % — ABNORMAL HIGH (ref 11.2–14.5)
WBC: 7.5 10*3/uL (ref 3.9–10.3)
nRBC: 0 % (ref 0–0)

## 2012-04-05 MED ORDER — DARBEPOETIN ALFA-POLYSORBATE 300 MCG/0.6ML IJ SOLN: 300.0000 ug | Freq: Once | INTRAMUSCULAR | Status: DC

## 2012-04-07 ENCOUNTER — Ambulatory Visit: Payer: Medicare Other

## 2012-04-12 ENCOUNTER — Other Ambulatory Visit: Payer: Medicare Other | Admitting: Lab

## 2012-05-10 ENCOUNTER — Other Ambulatory Visit (HOSPITAL_BASED_OUTPATIENT_CLINIC_OR_DEPARTMENT_OTHER): Payer: Medicare Other | Admitting: Lab

## 2012-05-10 ENCOUNTER — Ambulatory Visit (HOSPITAL_BASED_OUTPATIENT_CLINIC_OR_DEPARTMENT_OTHER): Payer: MEDICARE

## 2012-05-10 VITALS — BP 157/69 | HR 52 | Temp 96.8°F

## 2012-05-10 DIAGNOSIS — D649 Anemia, unspecified: Secondary | ICD-10-CM

## 2012-05-10 DIAGNOSIS — D631 Anemia in chronic kidney disease: Secondary | ICD-10-CM

## 2012-05-10 DIAGNOSIS — N189 Chronic kidney disease, unspecified: Secondary | ICD-10-CM

## 2012-05-10 LAB — CBC WITH DIFFERENTIAL/PLATELET
BASO%: 1.9 % (ref 0.0–2.0)
EOS%: 2.5 % (ref 0.0–7.0)
MCH: 25.6 pg (ref 25.1–34.0)
MCHC: 33.3 g/dL (ref 31.5–36.0)
MONO%: 14 % (ref 0.0–14.0)
NEUT%: 55.7 % (ref 38.4–76.8)
RDW: 16.4 % — ABNORMAL HIGH (ref 11.2–14.5)
lymph#: 1.8 10*3/uL (ref 0.9–3.3)

## 2012-05-10 MED ORDER — DARBEPOETIN ALFA-POLYSORBATE 300 MCG/0.6ML IJ SOLN
300.0000 ug | Freq: Once | INTRAMUSCULAR | Status: AC
  Administered 2012-05-10: 300 ug via SUBCUTANEOUS
  Filled 2012-05-10: qty 0.6

## 2012-05-10 MED ORDER — DARBEPOETIN ALFA-POLYSORBATE 300 MCG/0.6ML IJ SOLN: 300.0000 ug | Freq: Once | INTRAMUSCULAR | Status: DC

## 2012-05-12 ENCOUNTER — Ambulatory Visit: Payer: Medicare Other

## 2012-06-07 ENCOUNTER — Other Ambulatory Visit (HOSPITAL_BASED_OUTPATIENT_CLINIC_OR_DEPARTMENT_OTHER): Payer: Medicare Other | Admitting: Lab

## 2012-06-07 ENCOUNTER — Ambulatory Visit: Payer: Medicare Other

## 2012-06-07 DIAGNOSIS — D649 Anemia, unspecified: Secondary | ICD-10-CM

## 2012-06-07 LAB — CBC WITH DIFFERENTIAL/PLATELET
Basophils Absolute: 0.1 10*3/uL (ref 0.0–0.1)
EOS%: 1.6 % (ref 0.0–7.0)
HGB: 11.8 g/dL (ref 11.6–15.9)
MCH: 25.2 pg (ref 25.1–34.0)
NEUT#: 4.3 10*3/uL (ref 1.5–6.5)
RBC: 4.68 10*6/uL (ref 3.70–5.45)
RDW: 15.2 % — ABNORMAL HIGH (ref 11.2–14.5)
lymph#: 1.7 10*3/uL (ref 0.9–3.3)
nRBC: 0 % (ref 0–0)

## 2012-06-07 NOTE — Progress Notes (Signed)
Aranesp not needed today, per protocol, d/t hgb 11.8.  Pt verbalizes understanding and is aware of next appt.

## 2012-06-09 ENCOUNTER — Ambulatory Visit: Payer: Medicare Other

## 2012-07-01 ENCOUNTER — Encounter: Payer: Self-pay | Admitting: Internal Medicine

## 2012-07-01 ENCOUNTER — Telehealth: Payer: Self-pay | Admitting: Internal Medicine

## 2012-07-01 NOTE — Telephone Encounter (Signed)
LMONVM REGARDING THE RE-ASSIGNING OF DR LO TO DR [facility]. PT WILL RECEIVE A F/U LETTER ALONG WITH THE APPT CALENDAR.

## 2012-07-12 ENCOUNTER — Other Ambulatory Visit: Payer: Self-pay | Admitting: Medical Oncology

## 2012-07-12 ENCOUNTER — Ambulatory Visit: Payer: Medicare Other

## 2012-07-12 ENCOUNTER — Other Ambulatory Visit (HOSPITAL_BASED_OUTPATIENT_CLINIC_OR_DEPARTMENT_OTHER): Payer: Medicare Other

## 2012-07-12 DIAGNOSIS — D649 Anemia, unspecified: Secondary | ICD-10-CM

## 2012-07-12 LAB — CBC WITH DIFFERENTIAL/PLATELET
BASO%: 1 % (ref 0.0–2.0)
Eosinophils Absolute: 0.1 10*3/uL (ref 0.0–0.5)
LYMPH%: 32.5 % (ref 14.0–49.7)
MCHC: 34 g/dL (ref 31.5–36.0)
MONO#: 0.8 10*3/uL (ref 0.1–0.9)
NEUT#: 2.9 10*3/uL (ref 1.5–6.5)
Platelets: 160 10*3/uL (ref 145–400)
RBC: 4.45 10*6/uL (ref 3.70–5.45)
RDW: 16.2 % — ABNORMAL HIGH (ref 11.2–14.5)
WBC: 5.8 10*3/uL (ref 3.9–10.3)
lymph#: 1.9 10*3/uL (ref 0.9–3.3)
nRBC: 0 % (ref 0–0)

## 2012-07-12 MED ORDER — DARBEPOETIN ALFA-POLYSORBATE 300 MCG/0.6ML IJ SOLN: 300.0000 ug | Freq: Once | INTRAMUSCULAR | Status: DC

## 2012-07-14 ENCOUNTER — Ambulatory Visit: Payer: Medicare Other

## 2012-08-09 ENCOUNTER — Ambulatory Visit (HOSPITAL_BASED_OUTPATIENT_CLINIC_OR_DEPARTMENT_OTHER): Payer: No Typology Code available for payment source | Admitting: Physician Assistant

## 2012-08-09 ENCOUNTER — Ambulatory Visit: Payer: Medicare Other | Admitting: Hematology and Oncology

## 2012-08-09 ENCOUNTER — Other Ambulatory Visit (HOSPITAL_BASED_OUTPATIENT_CLINIC_OR_DEPARTMENT_OTHER): Payer: No Typology Code available for payment source | Admitting: Lab

## 2012-08-09 ENCOUNTER — Telehealth: Payer: Self-pay | Admitting: Internal Medicine

## 2012-08-09 VITALS — BP 155/80 | HR 70 | Temp 98.7°F | Resp 18 | Ht 63.0 in | Wt 123.6 lb

## 2012-08-09 DIAGNOSIS — D638 Anemia in other chronic diseases classified elsewhere: Secondary | ICD-10-CM

## 2012-08-09 DIAGNOSIS — D649 Anemia, unspecified: Secondary | ICD-10-CM

## 2012-08-09 DIAGNOSIS — D509 Iron deficiency anemia, unspecified: Secondary | ICD-10-CM

## 2012-08-09 LAB — CBC WITH DIFFERENTIAL/PLATELET
BASO%: 1 % (ref 0.0–2.0)
Basophils Absolute: 0.1 10*3/uL (ref 0.0–0.1)
Eosinophils Absolute: 0.1 10*3/uL (ref 0.0–0.5)
HCT: 32.9 % — ABNORMAL LOW (ref 34.8–46.6)
HGB: 11.1 g/dL — ABNORMAL LOW (ref 11.6–15.9)
LYMPH%: 31 % (ref 14.0–49.7)
MONO#: 0.8 10*3/uL (ref 0.1–0.9)
NEUT#: 3.8 10*3/uL (ref 1.5–6.5)
NEUT%: 54.8 % (ref 38.4–76.8)
Platelets: 183 10*3/uL (ref 145–400)
WBC: 7 10*3/uL (ref 3.9–10.3)
lymph#: 2.2 10*3/uL (ref 0.9–3.3)

## 2012-08-09 LAB — COMPREHENSIVE METABOLIC PANEL (CC13)
AST: 19 U/L (ref 5–34)
Albumin: 3.3 g/dL — ABNORMAL LOW (ref 3.5–5.0)
BUN: 28.8 mg/dL — ABNORMAL HIGH (ref 7.0–26.0)
CO2: 24 mEq/L (ref 22–29)
Calcium: 10.8 mg/dL — ABNORMAL HIGH (ref 8.4–10.4)
Chloride: 108 mEq/L — ABNORMAL HIGH (ref 98–107)
Creatinine: 1.6 mg/dL — ABNORMAL HIGH (ref 0.6–1.1)
Potassium: 4.4 mEq/L (ref 3.5–5.1)

## 2012-08-09 NOTE — Telephone Encounter (Signed)
Gave pt appt for lab, ML and chemo for April 2014

## 2012-08-09 NOTE — Patient Instructions (Addendum)
Continue your monthly injections with Aranesp as long as you're lab results meet criteria Followup with Dr. Julien Nordmann in 3 months

## 2012-08-10 ENCOUNTER — Ambulatory Visit: Payer: Medicare Other | Admitting: Internal Medicine

## 2012-08-13 NOTE — Progress Notes (Signed)
No images are attached to the encounter. No scans are attached to the encounter. No scans are attached to the encounter. [place], [place] [address] [place] [id number]  DIAGNOSIS: Anemia of chronic disease plus/minus iron deficiency  CURRENT THERAPY: Aranesp 300 mcg subcutaneously every 4 weeks in addition to Integra Plus,1 capsule by mouth daily  INTERVAL HISTORY: Pam [name] 79 y.o. female returns for a scheduled regular office visit for followup of her anemia of chronic disease plus/minus iron deficiency anemia. She has a history of chronic renal disease. She was previously followed by Dr. Jamse Arn. She has done well and voices no complaints. Since her last office visit she has had no hospitalizations, operations, or illnesses. Her energy level is good, she denied shortness of breath, chest pain, cough or hemoptysis. She's had no diarrhea or constipation.  MEDICAL HISTORY: Past Medical History  Diagnosis Date  . Anemia   . Personal history of colonic polyps 05/01/2001    hyperplastic  . Hypoparathyroidism   . Diverticulosis   . Vitamin D deficiency   . Spinal stenosis   . Osteoporosis   . Chronic kidney disease   . Atrial fibrillation   . Hypercholesterolemia   . Hypertension   . GERD (gastroesophageal reflux disease)   . CAD (coronary artery disease)     ALLERGIES:  has No Known Allergies.  MEDICATIONS:  Current Outpatient Prescriptions  Medication Sig Dispense Refill  . omeprazole (PRILOSEC) 20 MG capsule Take 20 mg by mouth daily.      Marland Kitchen amLODipine (NORVASC) 5 MG tablet       . digoxin (LANOXIN) 0.125 MG tablet Take 125 mcg by mouth daily.        Marland Kitchen DILT-XR 120 MG 24 hr capsule       . FeFum-FePoly-FA-B Cmp-C-Biot (INTEGRA PLUS PO) Take 1 tablet by mouth daily.        . furosemide (LASIX) 20 MG tablet Take 20 mg by mouth daily as needed.        Marland Kitchen losartan (COZAAR) 100 MG tablet       . Olmesartan-Amlodipine-HCTZ  (TRIBENZOR) 40-5-12.5 MG TABS Take 1 tablet by mouth daily.        . pravastatin (PRAVACHOL) 20 MG tablet        No current facility-administered medications for this visit.    SURGICAL HISTORY:  Past Surgical History  Procedure Laterality Date  . S/p removal of bilateral kidney stones    . Abdominal hysterectomy    . Colonoscopy    . Polypectomy      REVIEW OF SYSTEMS:  A comprehensive review of systems was negative.   PHYSICAL EXAMINATION: General appearance: alert, cooperative, appears stated age and no distress Head: Normocephalic, without obvious abnormality, atraumatic Neck: no adenopathy, no carotid bruit, no JVD, supple, symmetrical, trachea midline and thyroid not enlarged, symmetric, no tenderness/mass/nodules Lymph nodes: Cervical, supraclavicular, and axillary nodes normal. Resp: clear to auscultation bilaterally Cardio: regular rate and rhythm, S1, S2 normal, no murmur, click, rub or gallop GI: soft, non-tender; bowel sounds normal; no masses,  no organomegaly Extremities: extremities normal, atraumatic, no cyanosis or edema Neurologic: Alert and oriented X 3, normal strength and tone. Normal symmetric reflexes. Normal coordination and gait  ECOG PERFORMANCE STATUS: 0 - Asymptomatic  Blood pressure 155/80, pulse 70, temperature 98.7 F (37.1 C), temperature source Oral, resp. rate 18, height 5\' 3"  (1.6 m), weight 123 lb 9.6 oz (56.065 kg).  LABORATORY DATA: Lab  Results  Component Value Date   WBC 7.0 08/09/2012   HGB 11.1* 08/09/2012   HCT 32.9* 08/09/2012   MCV 76.5* 08/09/2012   PLT 183 08/09/2012      Chemistry      Component Value Date/Time   NA 139 08/09/2012 1108   NA 139 02/07/2012 0906   K 4.4 08/09/2012 1108   K 4.4 02/07/2012 0906   CL 108* 08/09/2012 1108   CL 107 02/07/2012 0906   CO2 24 08/09/2012 1108   CO2 24 02/07/2012 0906   BUN 28.8* 08/09/2012 1108   BUN 29* 02/07/2012 0906   CREATININE 1.6* 08/09/2012 1108   CREATININE 1.7* 02/07/2012 0906       Component Value Date/Time   CALCIUM 10.8* 08/09/2012 1108   CALCIUM 10.4 02/07/2012 0906   ALKPHOS 81 08/09/2012 1108   ALKPHOS 79 02/07/2012 0906   AST 19 08/09/2012 1108   AST 20 02/07/2012 0906   ALT 11 08/09/2012 1108   ALT 13 02/07/2012 0906   BILITOT 1.15 08/09/2012 1108   BILITOT 0.9 02/07/2012 0906       RADIOGRAPHIC STUDIES:  No results found.   ASSESSMENT/PLAN: Anemia of chronic disease plus/minus iron deficiency anemia currently with a stable hemoglobin at 11.1. She does have a history of chronic renal disease. Patient was discussed with Dr. Julien Nordmann. She will continue with her monthly Aranesp injections as long as her hemoglobin and hematocrit meet criteria. She will followup with her primary care physician regarding her chronic renal disease, hypertension, hypercholesterolemia and GERD.     Wynetta Emery, Pam Altice E, PA-C     All questions were answered. The patient knows to call the clinic with any problems, questions or concerns. We can certainly see the patient much sooner if necessary.  I spent 20 minutes counseling the patient face to face. The total time spent in the appointment was 30 minutes.

## 2012-08-15 ENCOUNTER — Ambulatory Visit: Payer: Medicare Other | Admitting: Internal Medicine

## 2012-09-04 ENCOUNTER — Other Ambulatory Visit (HOSPITAL_BASED_OUTPATIENT_CLINIC_OR_DEPARTMENT_OTHER): Payer: No Typology Code available for payment source | Admitting: Lab

## 2012-09-04 ENCOUNTER — Ambulatory Visit: Payer: Medicare Other

## 2012-09-04 DIAGNOSIS — N289 Disorder of kidney and ureter, unspecified: Secondary | ICD-10-CM

## 2012-09-04 DIAGNOSIS — D649 Anemia, unspecified: Secondary | ICD-10-CM

## 2012-09-04 LAB — CBC WITH DIFFERENTIAL/PLATELET
BASO%: 1 % (ref 0.0–2.0)
Basophils Absolute: 0.1 10*3/uL (ref 0.0–0.1)
EOS%: 1.6 % (ref 0.0–7.0)
HGB: 11.2 g/dL — ABNORMAL LOW (ref 11.6–15.9)
MCH: 26.5 pg (ref 25.1–34.0)
MCHC: 34.3 g/dL (ref 31.5–36.0)
MONO#: 0.9 10*3/uL (ref 0.1–0.9)
RDW: 14.8 % — ABNORMAL HIGH (ref 11.2–14.5)
WBC: 6.1 10*3/uL (ref 3.9–10.3)
lymph#: 1.6 10*3/uL (ref 0.9–3.3)

## 2012-09-04 MED ORDER — DARBEPOETIN ALFA-POLYSORBATE 300 MCG/0.6ML IJ SOLN: 300.0000 ug | Freq: Once | INTRAMUSCULAR | Status: DC

## 2012-09-25 ENCOUNTER — Other Ambulatory Visit (HOSPITAL_BASED_OUTPATIENT_CLINIC_OR_DEPARTMENT_OTHER): Payer: Medicare Other | Admitting: Lab

## 2012-09-25 ENCOUNTER — Ambulatory Visit: Payer: Medicare Other

## 2012-09-25 ENCOUNTER — Other Ambulatory Visit: Payer: Self-pay | Admitting: Medical Oncology

## 2012-09-25 DIAGNOSIS — D649 Anemia, unspecified: Secondary | ICD-10-CM

## 2012-09-25 DIAGNOSIS — N289 Disorder of kidney and ureter, unspecified: Secondary | ICD-10-CM

## 2012-09-25 LAB — CBC WITH DIFFERENTIAL/PLATELET
Basophils Absolute: 0.1 10*3/uL (ref 0.0–0.1)
EOS%: 1.1 % (ref 0.0–7.0)
Eosinophils Absolute: 0.1 10*3/uL (ref 0.0–0.5)
HCT: 32.6 % — ABNORMAL LOW (ref 34.8–46.6)
HGB: 11.2 g/dL — ABNORMAL LOW (ref 11.6–15.9)
MCH: 26.9 pg (ref 25.1–34.0)
MCV: 78.2 fL — ABNORMAL LOW (ref 79.5–101.0)
MONO%: 13.7 % (ref 0.0–14.0)
NEUT#: 3.3 10*3/uL (ref 1.5–6.5)
NEUT%: 51.6 % (ref 38.4–76.8)
RDW: 13.9 % (ref 11.2–14.5)
lymph#: 2.1 10*3/uL (ref 0.9–3.3)

## 2012-09-25 MED ORDER — DARBEPOETIN ALFA-POLYSORBATE 300 MCG/0.6ML IJ SOLN: 300.0000 ug | Freq: Once | INTRAMUSCULAR | Status: DC

## 2012-10-13 ENCOUNTER — Other Ambulatory Visit: Payer: Self-pay | Admitting: *Deleted

## 2012-10-13 DIAGNOSIS — D649 Anemia, unspecified: Secondary | ICD-10-CM

## 2012-10-16 ENCOUNTER — Ambulatory Visit: Payer: Medicare Other

## 2012-10-16 ENCOUNTER — Other Ambulatory Visit (HOSPITAL_BASED_OUTPATIENT_CLINIC_OR_DEPARTMENT_OTHER): Payer: Medicare Other | Admitting: Lab

## 2012-10-16 DIAGNOSIS — N289 Disorder of kidney and ureter, unspecified: Secondary | ICD-10-CM

## 2012-10-16 DIAGNOSIS — D649 Anemia, unspecified: Secondary | ICD-10-CM

## 2012-10-16 LAB — CBC WITH DIFFERENTIAL/PLATELET
Eosinophils Absolute: 0.1 10*3/uL (ref 0.0–0.5)
HCT: 34.3 % — ABNORMAL LOW (ref 34.8–46.6)
LYMPH%: 32.2 % (ref 14.0–49.7)
MCV: 78.7 fL — ABNORMAL LOW (ref 79.5–101.0)
MONO#: 0.8 10*3/uL (ref 0.1–0.9)
MONO%: 12.3 % (ref 0.0–14.0)
NEUT#: 3.5 10*3/uL (ref 1.5–6.5)
NEUT%: 53.5 % (ref 38.4–76.8)
Platelets: 162 10*3/uL (ref 145–400)
RBC: 4.36 10*6/uL (ref 3.70–5.45)
WBC: 6.6 10*3/uL (ref 3.9–10.3)

## 2012-10-16 MED ORDER — DARBEPOETIN ALFA-POLYSORBATE 300 MCG/0.6ML IJ SOLN: 300.0000 ug | Freq: Once | INTRAMUSCULAR | Status: DC

## 2012-11-06 ENCOUNTER — Other Ambulatory Visit (HOSPITAL_BASED_OUTPATIENT_CLINIC_OR_DEPARTMENT_OTHER): Payer: Medicare Other | Admitting: Lab

## 2012-11-06 ENCOUNTER — Ambulatory Visit: Payer: Medicare Other

## 2012-11-06 DIAGNOSIS — D649 Anemia, unspecified: Secondary | ICD-10-CM

## 2012-11-06 DIAGNOSIS — N289 Disorder of kidney and ureter, unspecified: Secondary | ICD-10-CM

## 2012-11-06 LAB — CBC WITH DIFFERENTIAL/PLATELET
BASO%: 0.2 % (ref 0.0–2.0)
Basophils Absolute: 0 10*3/uL (ref 0.0–0.1)
EOS%: 1.4 % (ref 0.0–7.0)
HCT: 34.9 % (ref 34.8–46.6)
HGB: 11.9 g/dL (ref 11.6–15.9)
LYMPH%: 26.7 % (ref 14.0–49.7)
MCH: 28 pg (ref 25.1–34.0)
MCHC: 34 g/dL (ref 31.5–36.0)
MCV: 82.3 fL (ref 79.5–101.0)
MONO%: 12.5 % (ref 0.0–14.0)
NEUT%: 59.2 % (ref 38.4–76.8)
Platelets: 150 10*3/uL (ref 145–400)

## 2012-11-06 LAB — COMPREHENSIVE METABOLIC PANEL (CC13)
AST: 18 U/L (ref 5–34)
Alkaline Phosphatase: 83 U/L (ref 40–150)
BUN: 33.7 mg/dL — ABNORMAL HIGH (ref 7.0–26.0)
Calcium: 10.8 mg/dL — ABNORMAL HIGH (ref 8.4–10.4)
Chloride: 108 mEq/L — ABNORMAL HIGH (ref 98–107)
Creatinine: 1.8 mg/dL — ABNORMAL HIGH (ref 0.6–1.1)
Total Bilirubin: 1.64 mg/dL — ABNORMAL HIGH (ref 0.20–1.20)

## 2012-11-06 MED ORDER — DARBEPOETIN ALFA-POLYSORBATE 300 MCG/0.6ML IJ SOLN: 300.0000 ug | Freq: Once | INTRAMUSCULAR | Status: DC

## 2012-11-07 ENCOUNTER — Telehealth: Payer: Self-pay | Admitting: Medical Oncology

## 2012-11-07 NOTE — Telephone Encounter (Signed)
Message copied by Ardeen Garland on Tue Nov 07, 2012  2:51 PM ------      Message from: Carlton Adam      Created: Mon Nov 06, 2012  3:47 PM       Abnormal results, please call and notify patient to increase her fluid intake as her renal function ( creatinine) is slightly high. ------

## 2012-11-07 NOTE — Telephone Encounter (Signed)
Message copied by Ardeen Garland on Tue Nov 07, 2012  4:34 PM ------      Message from: Carlton Adam      Created: Mon Nov 06, 2012  3:47 PM       Abnormal results, please call and notify patient to increase her fluid intake as her renal function ( creatinine) is slightly high. ------

## 2012-11-07 NOTE — Telephone Encounter (Signed)
I left voice mail with instructions to increase fluid.

## 2012-11-13 ENCOUNTER — Ambulatory Visit: Payer: MEDICARE

## 2012-11-13 ENCOUNTER — Other Ambulatory Visit: Payer: MEDICARE | Admitting: Lab

## 2012-11-13 ENCOUNTER — Ambulatory Visit: Payer: MEDICARE | Admitting: Physician Assistant

## 2012-11-16 ENCOUNTER — Telehealth (HOSPITAL_COMMUNITY): Payer: Self-pay | Admitting: Cardiovascular Disease

## 2012-11-16 NOTE — Telephone Encounter (Signed)
I called the patient to schedule her echo that was ordered by Dr. Mellody Drown back in April, however the patient states that she normally gets a carotid doppler ordered by Dr. Gwenlyn Found. She wants to know if she should have a carotid as well as the echo.

## 2012-11-17 NOTE — Telephone Encounter (Signed)
I called the patient to schedule her echo that was ordered by Dr. Mellody Drown back in April, however the patient states that she normally gets a carotid doppler ordered by Dr. Gwenlyn Found. She wants to know if she should have a carotid as well as the echo.

## 2012-11-27 ENCOUNTER — Other Ambulatory Visit (HOSPITAL_BASED_OUTPATIENT_CLINIC_OR_DEPARTMENT_OTHER): Payer: Medicare Other | Admitting: Lab

## 2012-11-27 ENCOUNTER — Ambulatory Visit: Payer: Medicare Other

## 2012-11-27 ENCOUNTER — Encounter: Payer: Self-pay | Admitting: Physician Assistant

## 2012-11-27 DIAGNOSIS — N289 Disorder of kidney and ureter, unspecified: Secondary | ICD-10-CM

## 2012-11-27 DIAGNOSIS — D649 Anemia, unspecified: Secondary | ICD-10-CM

## 2012-11-27 LAB — CBC WITH DIFFERENTIAL/PLATELET
Basophils Absolute: 0.1 10*3/uL (ref 0.0–0.1)
Eosinophils Absolute: 0.1 10*3/uL (ref 0.0–0.5)
HGB: 11 g/dL — ABNORMAL LOW (ref 11.6–15.9)
LYMPH%: 26.2 % (ref 14.0–49.7)
MONO#: 1 10*3/uL — ABNORMAL HIGH (ref 0.1–0.9)
NEUT#: 4.2 10*3/uL (ref 1.5–6.5)
Platelets: 172 10*3/uL (ref 145–400)
RBC: 4.1 10*6/uL (ref 3.70–5.45)
WBC: 7.2 10*3/uL (ref 3.9–10.3)
nRBC: 0 % (ref 0–0)

## 2012-11-27 MED ORDER — DARBEPOETIN ALFA-POLYSORBATE 300 MCG/0.6ML IJ SOLN: 300.0000 ug | Freq: Once | INTRAMUSCULAR | Status: DC

## 2012-11-27 NOTE — Telephone Encounter (Signed)
Carotid doppler is not needed at this time per Dr Gwenlyn Found

## 2012-11-28 ENCOUNTER — Telehealth: Payer: Self-pay | Admitting: Internal Medicine

## 2012-11-28 NOTE — Telephone Encounter (Signed)
lvm for pt regarding to july appts...mailed pt avs and letter

## 2012-12-05 ENCOUNTER — Telehealth: Payer: Self-pay | Admitting: Internal Medicine

## 2012-12-05 NOTE — Telephone Encounter (Signed)
lvm for pt about time chage of 7.28.14 appt....mailed pt appt sched and avs with letter

## 2012-12-08 ENCOUNTER — Other Ambulatory Visit: Payer: Self-pay | Admitting: Internal Medicine

## 2012-12-08 DIAGNOSIS — I4891 Unspecified atrial fibrillation: Secondary | ICD-10-CM

## 2012-12-11 ENCOUNTER — Ambulatory Visit (HOSPITAL_COMMUNITY)
Admission: RE | Admit: 2012-12-11 | Discharge: 2012-12-11 | Disposition: A | Discharge status: Home / Self Care | Payer: Medicare Other | Source: Ambulatory Visit | Attending: Cardiovascular Disease | Admitting: Cardiovascular Disease

## 2012-12-11 DIAGNOSIS — I080 Rheumatic disorders of both mitral and aortic valves: Secondary | ICD-10-CM | POA: Insufficient documentation

## 2012-12-11 DIAGNOSIS — E785 Hyperlipidemia, unspecified: Secondary | ICD-10-CM | POA: Insufficient documentation

## 2012-12-11 DIAGNOSIS — I10 Essential (primary) hypertension: Secondary | ICD-10-CM | POA: Insufficient documentation

## 2012-12-11 DIAGNOSIS — I379 Nonrheumatic pulmonary valve disorder, unspecified: Secondary | ICD-10-CM | POA: Insufficient documentation

## 2012-12-11 DIAGNOSIS — I4891 Unspecified atrial fibrillation: Secondary | ICD-10-CM

## 2012-12-11 DIAGNOSIS — I079 Rheumatic tricuspid valve disease, unspecified: Secondary | ICD-10-CM | POA: Insufficient documentation

## 2012-12-11 NOTE — Progress Notes (Signed)
2D Echo Performed 12/11/2012    Marygrace Drought, RCS

## 2012-12-12 ENCOUNTER — Encounter: Payer: Self-pay | Admitting: Cardiovascular Disease

## 2012-12-15 NOTE — Progress Notes (Signed)
Nl LV fxn, mild AS

## 2012-12-25 ENCOUNTER — Ambulatory Visit: Payer: Medicare Other | Admitting: Internal Medicine

## 2012-12-25 ENCOUNTER — Other Ambulatory Visit: Payer: Medicare Other | Admitting: Lab

## 2012-12-25 ENCOUNTER — Ambulatory Visit: Payer: Medicare Other

## 2012-12-25 ENCOUNTER — Other Ambulatory Visit (HOSPITAL_BASED_OUTPATIENT_CLINIC_OR_DEPARTMENT_OTHER): Payer: Medicare Other | Admitting: Lab

## 2012-12-25 ENCOUNTER — Ambulatory Visit (HOSPITAL_BASED_OUTPATIENT_CLINIC_OR_DEPARTMENT_OTHER): Payer: Medicare Other | Admitting: Internal Medicine

## 2012-12-25 ENCOUNTER — Encounter: Payer: Self-pay | Admitting: Internal Medicine

## 2012-12-25 VITALS — BP 209/66 | HR 68 | Temp 97.9°F | Resp 18 | Ht 63.0 in | Wt 126.0 lb

## 2012-12-25 DIAGNOSIS — D649 Anemia, unspecified: Secondary | ICD-10-CM

## 2012-12-25 DIAGNOSIS — N289 Disorder of kidney and ureter, unspecified: Secondary | ICD-10-CM

## 2012-12-25 LAB — CBC WITH DIFFERENTIAL/PLATELET
Basophils Absolute: 0 10*3/uL (ref 0.0–0.1)
HCT: 34.7 % — ABNORMAL LOW (ref 34.8–46.6)
HGB: 11.4 g/dL — ABNORMAL LOW (ref 11.6–15.9)
MCH: 27.3 pg (ref 25.1–34.0)
MONO#: 1.2 10*3/uL — ABNORMAL HIGH (ref 0.1–0.9)
NEUT%: 56 % (ref 38.4–76.8)
WBC: 8.3 10*3/uL (ref 3.9–10.3)
lymph#: 2.3 10*3/uL (ref 0.9–3.3)

## 2012-12-25 MED ORDER — DARBEPOETIN ALFA-POLYSORBATE 300 MCG/0.6ML IJ SOLN: 300.0000 ug | Freq: Once | INTRAMUSCULAR | Status: DC

## 2012-12-25 NOTE — Progress Notes (Signed)
[facility] [phone]   Fax:([phone]) [place], MD [address] [place] [phone]  DIAGNOSIS: Anemia of chronic disease plus/minus iron deficiency   CURRENT THERAPY: Aranesp 300 mcg subcutaneously every 4 weeks in addition to Integra Plus,1 capsule by mouth daily  INTERVAL HISTORY: Pam [name] 79 y.o. female returns to the clinic today for three-month followup visit accompanied her husband. The patient is feeling fine today with no specific complaints. She denied having any significant fatigue or weakness. She denied having any dizzy spells. The patient is tolerating her treatment with Aranesp and Integra plus fairly well. She had repeat CBC performed earlier today and she is here for evaluation and discussion of her lab results.   MEDICAL HISTORY: Past Medical History  Diagnosis Date  . Anemia   . Personal history of colonic polyps 05/01/2001    hyperplastic  . Hypoparathyroidism   . Diverticulosis   . Vitamin D deficiency   . Spinal stenosis   . Osteoporosis   . Chronic kidney disease   . Atrial fibrillation   . Hypercholesterolemia   . Hypertension   . GERD (gastroesophageal reflux disease)   . CAD (coronary artery disease)     ALLERGIES:  has No Known Allergies.  MEDICATIONS:  Current Outpatient Prescriptions  Medication Sig Dispense Refill  . amLODipine (NORVASC) 5 MG tablet       . digoxin (LANOXIN) 0.125 MG tablet Take 125 mcg by mouth daily.        Marland Kitchen DILT-XR 120 MG 24 hr capsule       . FeFum-FePoly-FA-B Cmp-C-Biot (INTEGRA PLUS PO) Take 1 tablet by mouth daily.        . furosemide (LASIX) 20 MG tablet Take 20 mg by mouth daily as needed.        Marland Kitchen losartan (COZAAR) 100 MG tablet       . Olmesartan-Amlodipine-HCTZ (TRIBENZOR) 40-5-12.5 MG TABS Take 1 tablet by mouth daily.        Marland Kitchen omeprazole (PRILOSEC) 20 MG capsule Take 20 mg by mouth daily.      . pravastatin (PRAVACHOL) 20 MG tablet         No current facility-administered medications for this visit.    SURGICAL HISTORY:  Past Surgical History  Procedure Laterality Date  . S/p removal of bilateral kidney stones    . Abdominal hysterectomy    . Colonoscopy    . Polypectomy      REVIEW OF SYSTEMS:  A comprehensive review of systems was negative.   PHYSICAL EXAMINATION: General appearance: alert, cooperative and no distress Head: Normocephalic, without obvious abnormality, atraumatic Neck: no adenopathy Lymph nodes: Cervical, supraclavicular, and axillary nodes normal. Resp: clear to auscultation bilaterally Cardio: regular rate and rhythm, S1, S2 normal, no murmur, click, rub or gallop GI: soft, non-tender; bowel sounds normal; no masses,  no organomegaly Extremities: extremities normal, atraumatic, no cyanosis or edema  ECOG PERFORMANCE STATUS: 1 - Symptomatic but completely ambulatory  Blood pressure 209/66, pulse 68, temperature 97.9 F (36.6 C), temperature source Oral, resp. rate 18, height 5\' 3"  (1.6 m), weight 126 lb (57.153 kg).  LABORATORY DATA: Lab Results  Component Value Date   WBC 8.3 12/25/2012   HGB 11.4* 12/25/2012   HCT 34.7* 12/25/2012   MCV 82.8 12/25/2012   PLT 156 12/25/2012      Chemistry      Component Value Date/Time   NA 140 11/06/2012 1110  NA 139 02/07/2012 0906   K 4.4 11/06/2012 1110   K 4.4 02/07/2012 0906   CL 108* 11/06/2012 1110   CL 107 02/07/2012 0906   CO2 23 11/06/2012 1110   CO2 24 02/07/2012 0906   BUN 33.7* 11/06/2012 1110   BUN 29* 02/07/2012 0906   CREATININE 1.8* 11/06/2012 1110   CREATININE 1.7* 02/07/2012 0906      Component Value Date/Time   CALCIUM 10.8* 11/06/2012 1110   CALCIUM 10.4 02/07/2012 0906   ALKPHOS 83 11/06/2012 1110   ALKPHOS 79 02/07/2012 0906   AST 18 11/06/2012 1110   AST 20 02/07/2012 0906   ALT 12 11/06/2012 1110   ALT 13 02/07/2012 0906   BILITOT 1.64* 11/06/2012 1110   BILITOT 0.9 02/07/2012 0906       RADIOGRAPHIC STUDIES: No results found.  ASSESSMENT AND  PLAN: This is a very pleasant 79 years old African American female with anemia of chronic disease secondary to renal insufficiency in addition to iron deficiency anemia. The patient is currently on treatment with Aranesp and Integra plus and tolerating it fairly well. I recommended for the patient to continue her current treatment. She would come back for followup visit in 3 months with repeat CBC, iron study and ferritin. She was advised to call immediately if she has any concerning symptoms in the interval.  The patient voices understanding of current disease status and treatment options and is in agreement with the current care plan.  All questions were answered. The patient knows to call the clinic with any problems, questions or concerns. We can certainly see the patient much sooner if necessary.

## 2012-12-25 NOTE — Patient Instructions (Signed)
Continue Aranesp and iron supplement. Followup visit in 3 months

## 2012-12-26 ENCOUNTER — Telehealth: Payer: Self-pay | Admitting: Internal Medicine

## 2012-12-26 NOTE — Telephone Encounter (Signed)
lvm for pt regarding to aug and sept appts...mailed pt appt sched/avs and letter

## 2013-01-11 ENCOUNTER — Encounter: Payer: Self-pay | Admitting: *Deleted

## 2013-01-15 ENCOUNTER — Encounter: Payer: Self-pay | Admitting: Cardiovascular Disease

## 2013-01-15 ENCOUNTER — Ambulatory Visit (INDEPENDENT_AMBULATORY_CARE_PROVIDER_SITE_OTHER): Payer: Medicare Other | Admitting: Cardiovascular Disease

## 2013-01-15 VITALS — BP 162/92 | HR 67 | Ht 62.0 in | Wt 126.0 lb

## 2013-01-15 DIAGNOSIS — R600 Localized edema: Secondary | ICD-10-CM | POA: Insufficient documentation

## 2013-01-15 DIAGNOSIS — Z79899 Other long term (current) drug therapy: Secondary | ICD-10-CM

## 2013-01-15 DIAGNOSIS — I4891 Unspecified atrial fibrillation: Secondary | ICD-10-CM

## 2013-01-15 DIAGNOSIS — R011 Cardiac murmur, unspecified: Secondary | ICD-10-CM

## 2013-01-15 DIAGNOSIS — I10 Essential (primary) hypertension: Secondary | ICD-10-CM

## 2013-01-15 MED ORDER — FUROSEMIDE 40 MG PO TABS: 40.0000 mg | ORAL_TABLET | Freq: Every day | ORAL | Status: DC

## 2013-01-15 NOTE — Assessment & Plan Note (Signed)
Patient is on several antihypertensive medications. Her blood pressure today is 162/92. I am going to stop her amlodipine. She is already on losartan as well. She may need to have the addition of hydralazine but we will see what her blood pressure is as well as her edema in one month when she's followed up in our clinic by a mid-level provider

## 2013-01-15 NOTE — Assessment & Plan Note (Signed)
Patient is on 2 calcium channel blockers (amlodipine, diltiazem, and has noticed bilateral ankle edema. She denies chest pain or shortness of breath.I'm going to stop amlodipine and increase her Lasix from 20-40 mg daily. We'll check a BMET in 2- 3 weeks and have her see mid-level provider back after that

## 2013-01-15 NOTE — Assessment & Plan Note (Signed)
Patient is in chronic A. Fib with a controlled ventricular response. She was on a oral anticoagulant the past which was stopped by Dr. [name] because of melena

## 2013-01-15 NOTE — Patient Instructions (Signed)
  Your physician wants you to follow-up with him in : 6 months                                            and with an extender in : 1 month                    You will receive a reminder letter in the mail one month in advance. If you don't receive a letter, please call our office to schedule the follow-up appointment.   Your physician recommends that you return for lab work in: 3 weeks   Your physician has recommended you make the following change in your medication: increase the lasix to 40mg  daily   Your physician has ordered the following tests: echocardiogram Your physician has requested that you have an echocardiogram. Echocardiography is a painless test that uses sound waves to create images of your heart. It provides your doctor with information about the size and shape of your heart and how well your heart's chambers and valves are working. This procedure takes approximately one hour. There are no restrictions for this procedure.

## 2013-01-15 NOTE — Progress Notes (Signed)
01/15/2013 Pam [name]   09/28/1925  [id number]  Primary Physician [name], MD Primary Cardiologist: [name] MD [name]   HPI:  [name] is a 79 year old thin appearing married African American female patient of Dr. [name] in the past. She has a history of chronic A. Fib rate controlled. She has treated hypertension, hyperlipidemia as well as was moderate carotid disease. She saw Dr. [name] year ago. She denies chest pain or shortness of breath. Her major complaint is bilateral ankle edema over the last several weeks.   Current Outpatient Prescriptions  Medication Sig Dispense Refill  . digoxin (LANOXIN) 0.125 MG tablet Take 125 mcg by mouth daily.        Marland Kitchen DILT-XR 120 MG 24 hr capsule Take 120 mg by mouth daily.       Marland Kitchen FeFum-FePoly-FA-B Cmp-C-Biot (INTEGRA PLUS PO) Take 1 tablet by mouth daily.        . furosemide (LASIX) 20 MG tablet Take 20 mg by mouth daily as needed.        Marland Kitchen losartan (COZAAR) 100 MG tablet Take 100 mg by mouth daily.       . polyethylene glycol (MIRALAX / GLYCOLAX) packet Take 17 g by mouth daily as needed.       No current facility-administered medications for this visit.    No Known Allergies  History   Social History  . Marital Status: Married    Spouse Name: N/A    Number of Children: 0  . Years of Education: N/A   Occupational History  .     Social History Main Topics  . Smoking status: Current Some Day Smoker    Types: Cigarettes  . Smokeless tobacco: Never Used     Comment: smokes 2-3 cigs daily  . Alcohol Use: No     Comment: occ  . Drug Use: No  . Sexual Activity: Not on file   Other Topics Concern  . Not on file   Social History Narrative  . No narrative on file     Review of Systems: General: negative for chills, fever, night sweats or weight changes.  Cardiovascular: negative for chest pain, dyspnea on exertion, edema, orthopnea, palpitations, paroxysmal nocturnal dyspnea or shortness of  breath Dermatological: negative for rash Respiratory: negative for cough or wheezing Urologic: negative for hematuria Abdominal: negative for nausea, vomiting, diarrhea, bright red blood per rectum, melena, or hematemesis Neurologic: negative for visual changes, syncope, or dizziness All other systems reviewed and are otherwise negative except as noted above.    Blood pressure 162/92, pulse 67, height 5\' 2"  (1.575 m), weight 126 lb (57.153 kg).  General appearance: alert and no distress Neck: no adenopathy, no JVD, supple, symmetrical, trachea midline, thyroid not enlarged, symmetric, no tenderness/mass/nodules and soft bilateral carotid bruits Lungs: clear to auscultation bilaterally Heart: irregularly irregular rhythm and soft outflow tract murmur Extremities: 1-2+ ankle edema  EKG atrial fibrillation with a ventricular response of 64 and nonspecific ST and T-wave changes  ASSESSMENT AND PLAN:   Ankle edema Patient is on 2 calcium channel blockers (amlodipine, diltiazem, and has noticed bilateral ankle edema. She denies chest pain or shortness of breath.I'm going to stop amlodipine and increase her Lasix from 20-40 mg daily. We'll check a BMET in 2- 3 weeks and have her see mid-level provider back after that  FIBRILLATION, ATRIAL Patient is in chronic A. Fib with a controlled ventricular response. She was on a oral anticoagulant the past which was stopped by  Dr. [name] because of melena  HYPERTENSION Patient is on several antihypertensive medications. Her blood pressure today is 162/92. I am going to stop her amlodipine. She is already on losartan as well. She may need to have the addition of hydralazine but we will see what her blood pressure is as well as her edema in one month when she's followed up in our clinic by a mid-level provider      [name] MD [facility] 01/15/2013 4:45 PM

## 2013-01-15 NOTE — Addendum Note (Signed)
Addended by: Diana Eves on: 01/15/2013 05:30 PM   Modules accepted: Orders

## 2013-01-22 ENCOUNTER — Other Ambulatory Visit (HOSPITAL_BASED_OUTPATIENT_CLINIC_OR_DEPARTMENT_OTHER): Payer: Medicare Other | Admitting: Lab

## 2013-01-22 ENCOUNTER — Ambulatory Visit: Payer: Medicare Other

## 2013-01-22 DIAGNOSIS — D649 Anemia, unspecified: Secondary | ICD-10-CM

## 2013-01-22 LAB — CBC WITH DIFFERENTIAL/PLATELET
BASO%: 0.8 % (ref 0.0–2.0)
HCT: 32.4 % — ABNORMAL LOW (ref 34.8–46.6)
HGB: 11.1 g/dL — ABNORMAL LOW (ref 11.6–15.9)
MCHC: 34.3 g/dL (ref 31.5–36.0)
MONO#: 0.8 10*3/uL (ref 0.1–0.9)
NEUT%: 59 % (ref 38.4–76.8)
WBC: 6.3 10*3/uL (ref 3.9–10.3)
lymph#: 1.7 10*3/uL (ref 0.9–3.3)
nRBC: 0 % (ref 0–0)

## 2013-01-22 NOTE — Progress Notes (Signed)
Per lew in pharmacy, patient does not need aranesp injection.

## 2013-01-25 ENCOUNTER — Telehealth: Payer: Self-pay | Admitting: Cardiovascular Disease

## 2013-01-25 DIAGNOSIS — I350 Nonrheumatic aortic (valve) stenosis: Secondary | ICD-10-CM

## 2013-01-25 NOTE — Telephone Encounter (Signed)
Message forwarded to Dr. Gwenlyn Found for review and further instructions

## 2013-01-25 NOTE — Telephone Encounter (Signed)
Patient had echo here on JUly 14 which was ordered by Dr. Mellody Drown. She has one scheduled on September 8 ordered by Dr. Gwenlyn Found.  Does she need another this soon?

## 2013-01-25 NOTE — Telephone Encounter (Signed)
I spoke with patient.  She does not need another echo.  I will have Dr Gwenlyn Found review the echo results and I will call with results.  I will have upcoming echo cancelled.

## 2013-01-31 NOTE — Telephone Encounter (Signed)
2-D echocardiogram performed in July showed mild aortic stenosis with an aortic valve area of 1.12 cm. There is no reason to repeat this currently but it should be repeated on an annual basis

## 2013-01-31 NOTE — Telephone Encounter (Signed)
Please advise 

## 2013-02-05 ENCOUNTER — Ambulatory Visit (HOSPITAL_COMMUNITY): Payer: Medicare Other

## 2013-02-05 NOTE — Telephone Encounter (Signed)
Pt notified of Dr Kennon Holter findings and is agreeable to an echo next year.

## 2013-02-12 ENCOUNTER — Ambulatory Visit (INDEPENDENT_AMBULATORY_CARE_PROVIDER_SITE_OTHER): Payer: Medicare Other | Admitting: Physician Assistant

## 2013-02-12 ENCOUNTER — Encounter: Payer: Self-pay | Admitting: Physician Assistant

## 2013-02-12 VITALS — BP 186/100 | HR 72 | Ht 62.0 in | Wt 124.0 lb

## 2013-02-12 DIAGNOSIS — I10 Essential (primary) hypertension: Secondary | ICD-10-CM

## 2013-02-12 MED ORDER — HYDRALAZINE HCL 25 MG PO TABS: 25.0000 mg | ORAL_TABLET | Freq: Three times a day (TID) | ORAL | Status: DC

## 2013-02-12 NOTE — Patient Instructions (Signed)
Start taking hydralazine 25 mg 3 times a day.  He'll be scheduled for blood pressure check with the nurse in 2 weeks.

## 2013-02-12 NOTE — Progress Notes (Signed)
Date:  02/12/2013   ID:  Pam [name], DOB 07/12/1925, MRN [id number]  PCP:  [name], MD  Primary Cardiologist:  Owens Shark     History of Present Illness: Pam [name] is a 79 y.o. female thin appearing married [place] American female patient of Dr. [name] in the past. She has a history of chronic A. Fib rate controlled. She has treated hypertension, hyperlipidemia as well as was moderate carotid disease. She saw Dr. [name] year ago.  Dr. Pearline Cables stopped her amlodipine approximately one month ago due to lower extremity edema and also increased her Lasix. Recent 2-D echocardiogram showed ejection fraction of 60-65%.  She presents now for blood pressure check he reports improvement in lower extremity edema.  She currently denies nausea, vomiting, fever, chest pain, shortness of breath, orthopnea, dizziness, PND, cough, congestion, abdominal pain, hematochezia, melena, lower extremity edema.  Wt Readings from Last 3 Encounters:  02/12/13 124 lb (56.246 kg)  01/15/13 126 lb (57.153 kg)  12/25/12 126 lb (57.153 kg)     Past Medical History  Diagnosis Date  . Anemia   . Personal history of colonic polyps 05/01/2001    hyperplastic  . Hypoparathyroidism   . Diverticulosis   . Vitamin D deficiency   . Spinal stenosis   . Osteoporosis   . Chronic kidney disease   . Atrial fibrillation   . Hypercholesterolemia   . Hypertension   . GERD (gastroesophageal reflux disease)   . CAD (coronary artery disease)   . Carotid bruit     bilateral  . Ankle edema     Current Outpatient Prescriptions  Medication Sig Dispense Refill  . digoxin (LANOXIN) 0.125 MG tablet Take 125 mcg by mouth daily.        Marland Kitchen DILT-XR 120 MG 24 hr capsule Take 120 mg by mouth daily.       Marland Kitchen FeFum-FePoly-FA-B Cmp-C-Biot (INTEGRA PLUS PO) Take 1 tablet by mouth daily.        . furosemide (LASIX) 40 MG tablet Take 1 tablet (40 mg total) by mouth daily.  30 tablet  6  . losartan (COZAAR) 100 MG tablet Take 100 mg by mouth  daily.       . hydrALAZINE (APRESOLINE) 25 MG tablet Take 1 tablet (25 mg total) by mouth 3 (three) times daily.  270 tablet  3  . polyethylene glycol (MIRALAX / GLYCOLAX) packet Take 17 g by mouth daily as needed.       No current facility-administered medications for this visit.    Allergies:   No Known Allergies  Social History:  The patient  reports that she has been smoking Cigarettes.  She has been smoking about 0.00 packs per day. She has never used smokeless tobacco. She reports that she does not drink alcohol or use illicit drugs.   Family history:   Family History  Problem Relation Age of Onset  . Heart disease Mother   . Heart disease Father   . Colon cancer Neg Hx    2-D echocardiogram Study Conclusions  - Left ventricle: The cavity size was normal. There was mild concentric hypertrophy. Systolic function was normal. The estimated ejection fraction was in the range of 60% to 65%, with beat to beat variation. Wall motion was normal; there were no regional wall motion abnormalities. LV diastolic function cannot be assessed due to atrial fibrillation. The E/e' ratio is >15, suggesting elevated LV filling pressure. - Aortic valve: Mild aortic stenosis. Peak and mean gradients of 19  mmHg and 10 mmHg, respectively. Mildly calcified leaflets. No regurgitation. Valve area: 1.12cm^2(VTI). Valve area: 1.09cm^2 (Vmax). - Mitral valve: Calcified annulus. Trivial regurgitation. Mean gradient: 30mm Hg (D). - Left atrium: Severely dilated. LA Volume/ BSA = 65.0 ml/m2 - Tricuspid valve: Mild regurgitation. - Pulmonary arteries: PA peak pressure: 36mm Hg (S). - Inferior vena cava: The vessel was normal in size; the respirophasic diameter changes were in the normal range (= 50%); findings are consistent with normal central venous pressure.   ROS:  Please see the history of present illness.  All other systems reviewed and negative.   PHYSICAL EXAM: VS:  BP 186/100  Pulse 72   Ht 5\' 2"  (1.575 m)  Wt 124 lb (56.246 kg)  BMI 22.67 kg/m2 Well nourished, well developed, in no acute distress HEENT: Pupils are equal round react to light accommodation extraocular movements are intact.  Cardiac: Regular rate and rhythm without murmurs rubs or gallops. Lungs:  clear to auscultation bilaterally, no wheezing, rhonchi or rales Ext: no lower extremity edema.  2+ radial and decreased dorsalis pedis pulses. Skin: warm and dry Neuro:  Grossly normal  EKG:  Atrial fibrillation with a controlled ventricular rate  ASSESSMENT AND PLAN:  Problem List Items Addressed This Visit   HYPERTENSION - Primary     Blood pressure still not well controlled at all. I will start hydralazine 25 mg 3 times a day. The patient be scheduled for a blood pressure check with the nurse in 2 weeks and we can titrate medications further at that time if needed.  The patient will also bring her home blood pressure cuff in so we can see how accurate it is.    Relevant Medications      hydrALAZINE (APRESOLINE) tablet

## 2013-02-12 NOTE — Assessment & Plan Note (Signed)
Blood pressure still not well controlled at all. I will start hydralazine 25 mg 3 times a day. The patient be scheduled for a blood pressure check with the nurse in 2 weeks and we can titrate medications further at that time if needed.  The patient will also bring her home blood pressure cuff in so we can see how accurate it is.

## 2013-02-19 ENCOUNTER — Ambulatory Visit: Payer: Medicare Other

## 2013-02-19 ENCOUNTER — Ambulatory Visit: Payer: Medicare Other | Admitting: Physician Assistant

## 2013-02-19 ENCOUNTER — Other Ambulatory Visit (HOSPITAL_BASED_OUTPATIENT_CLINIC_OR_DEPARTMENT_OTHER): Payer: Medicare Other | Admitting: Lab

## 2013-02-19 ENCOUNTER — Other Ambulatory Visit: Payer: Medicare Other | Admitting: Lab

## 2013-02-19 DIAGNOSIS — N289 Disorder of kidney and ureter, unspecified: Secondary | ICD-10-CM

## 2013-02-19 DIAGNOSIS — D649 Anemia, unspecified: Secondary | ICD-10-CM

## 2013-02-19 LAB — CBC WITH DIFFERENTIAL/PLATELET
Basophils Absolute: 0.1 10*3/uL (ref 0.0–0.1)
Eosinophils Absolute: 0.1 10*3/uL (ref 0.0–0.5)
HGB: 11.3 g/dL — ABNORMAL LOW (ref 11.6–15.9)
MCV: 79.8 fL (ref 79.5–101.0)
NEUT#: 4.8 10*3/uL (ref 1.5–6.5)
RDW: 13.7 % (ref 11.2–14.5)
lymph#: 2 10*3/uL (ref 0.9–3.3)

## 2013-02-19 MED ORDER — DARBEPOETIN ALFA-POLYSORBATE 300 MCG/0.6ML IJ SOLN: 300.0000 ug | Freq: Once | INTRAMUSCULAR | Status: DC

## 2013-02-19 NOTE — Progress Notes (Signed)
Hemoglobin 11.3 today.  Aranesp injection held.

## 2013-03-16 ENCOUNTER — Telehealth: Payer: Self-pay | Admitting: Internal Medicine

## 2013-03-16 NOTE — Telephone Encounter (Signed)
pt called to cx 10.20.14 appt...done...did not want to come till [facility]

## 2013-03-19 ENCOUNTER — Other Ambulatory Visit: Payer: Medicare Other | Admitting: Lab

## 2013-03-19 ENCOUNTER — Ambulatory Visit: Payer: Medicare Other

## 2013-04-16 ENCOUNTER — Other Ambulatory Visit (HOSPITAL_BASED_OUTPATIENT_CLINIC_OR_DEPARTMENT_OTHER): Payer: Medicare Other | Admitting: Lab

## 2013-04-16 ENCOUNTER — Ambulatory Visit (HOSPITAL_BASED_OUTPATIENT_CLINIC_OR_DEPARTMENT_OTHER): Payer: Medicare Other

## 2013-04-16 VITALS — BP 181/86 | HR 69 | Temp 97.8°F | Resp 18

## 2013-04-16 DIAGNOSIS — N189 Chronic kidney disease, unspecified: Secondary | ICD-10-CM

## 2013-04-16 DIAGNOSIS — D649 Anemia, unspecified: Secondary | ICD-10-CM

## 2013-04-16 DIAGNOSIS — N289 Disorder of kidney and ureter, unspecified: Secondary | ICD-10-CM

## 2013-04-16 DIAGNOSIS — D631 Anemia in chronic kidney disease: Secondary | ICD-10-CM

## 2013-04-16 LAB — CBC WITH DIFFERENTIAL/PLATELET
Eosinophils Absolute: 0.1 10*3/uL (ref 0.0–0.5)
HCT: 31.5 % — ABNORMAL LOW (ref 34.8–46.6)
HGB: 10.7 g/dL — ABNORMAL LOW (ref 11.6–15.9)
LYMPH%: 26.4 % (ref 14.0–49.7)
MONO#: 1.1 10*3/uL — ABNORMAL HIGH (ref 0.1–0.9)
NEUT#: 3.3 10*3/uL (ref 1.5–6.5)
NEUT%: 53.2 % (ref 38.4–76.8)
Platelets: 182 10*3/uL (ref 145–400)
WBC: 6.2 10*3/uL (ref 3.9–10.3)

## 2013-04-16 MED ORDER — DARBEPOETIN ALFA-POLYSORBATE 300 MCG/0.6ML IJ SOLN
300.0000 ug | Freq: Once | INTRAMUSCULAR | Status: AC
  Administered 2013-04-16: 300 ug via SUBCUTANEOUS
  Filled 2013-04-16: qty 0.6

## 2013-04-16 NOTE — Progress Notes (Signed)
Pt here for Aranesp injection.  Dr. Julien Nordmann notified of pt's increased blood pressure reading.  Per md, proceed with Aranesp injection as ordered.  Pt to see her primary for further instructions on increased blood pressure.  Verbal and written instructions given to both pt and husband.  Both voiced understanding.

## 2013-05-14 ENCOUNTER — Encounter (INDEPENDENT_AMBULATORY_CARE_PROVIDER_SITE_OTHER): Payer: Self-pay

## 2013-05-14 ENCOUNTER — Ambulatory Visit: Payer: Medicare Other

## 2013-05-14 ENCOUNTER — Other Ambulatory Visit (HOSPITAL_BASED_OUTPATIENT_CLINIC_OR_DEPARTMENT_OTHER): Payer: Medicare Other

## 2013-05-14 DIAGNOSIS — N289 Disorder of kidney and ureter, unspecified: Secondary | ICD-10-CM

## 2013-05-14 DIAGNOSIS — D649 Anemia, unspecified: Secondary | ICD-10-CM

## 2013-05-14 LAB — CBC WITH DIFFERENTIAL/PLATELET
Basophils Absolute: 0.1 10*3/uL (ref 0.0–0.1)
EOS%: 1.7 % (ref 0.0–7.0)
Eosinophils Absolute: 0.1 10*3/uL (ref 0.0–0.5)
HCT: 37.8 % (ref 34.8–46.6)
HGB: 12.9 g/dL (ref 11.6–15.9)
MCH: 26.9 pg (ref 25.1–34.0)
MCV: 78.9 fL — ABNORMAL LOW (ref 79.5–101.0)
MONO%: 15.4 % — ABNORMAL HIGH (ref 0.0–14.0)
NEUT%: 45.6 % (ref 38.4–76.8)
lymph#: 1.7 10*3/uL (ref 0.9–3.3)

## 2013-05-14 MED ORDER — DARBEPOETIN ALFA-POLYSORBATE 300 MCG/0.6ML IJ SOLN: 300.0000 ug | Freq: Once | INTRAMUSCULAR | Status: DC

## 2013-06-11 ENCOUNTER — Telehealth: Payer: Self-pay | Admitting: Internal Medicine

## 2013-06-11 NOTE — Telephone Encounter (Signed)
s.w. pt and sched nxt inj appts...done

## 2013-06-15 ENCOUNTER — Other Ambulatory Visit: Payer: Self-pay | Admitting: *Deleted

## 2013-06-15 MED ORDER — LOSARTAN POTASSIUM 100 MG PO TABS: 100.0000 mg | ORAL_TABLET | Freq: Every day | ORAL | Status: DC

## 2013-06-18 ENCOUNTER — Encounter: Payer: Self-pay | Admitting: Physician Assistant

## 2013-06-18 ENCOUNTER — Ambulatory Visit: Payer: Medicare Other

## 2013-06-18 ENCOUNTER — Encounter (INDEPENDENT_AMBULATORY_CARE_PROVIDER_SITE_OTHER): Payer: Self-pay

## 2013-06-18 ENCOUNTER — Other Ambulatory Visit (HOSPITAL_BASED_OUTPATIENT_CLINIC_OR_DEPARTMENT_OTHER): Payer: Medicare Other

## 2013-06-18 ENCOUNTER — Ambulatory Visit (HOSPITAL_BASED_OUTPATIENT_CLINIC_OR_DEPARTMENT_OTHER): Payer: Medicare Other | Admitting: Physician Assistant

## 2013-06-18 VITALS — BP 224/122 | HR 94 | Temp 97.1°F | Resp 18 | Ht 62.0 in | Wt 126.1 lb

## 2013-06-18 DIAGNOSIS — D631 Anemia in chronic kidney disease: Secondary | ICD-10-CM

## 2013-06-18 DIAGNOSIS — N189 Chronic kidney disease, unspecified: Secondary | ICD-10-CM

## 2013-06-18 DIAGNOSIS — N039 Chronic nephritic syndrome with unspecified morphologic changes: Secondary | ICD-10-CM

## 2013-06-18 DIAGNOSIS — D649 Anemia, unspecified: Secondary | ICD-10-CM

## 2013-06-18 DIAGNOSIS — I10 Essential (primary) hypertension: Secondary | ICD-10-CM

## 2013-06-18 DIAGNOSIS — N289 Disorder of kidney and ureter, unspecified: Secondary | ICD-10-CM

## 2013-06-18 LAB — CBC WITH DIFFERENTIAL/PLATELET
BASO%: 1.3 % (ref 0.0–2.0)
Basophils Absolute: 0.1 10*3/uL (ref 0.0–0.1)
EOS ABS: 0.1 10*3/uL (ref 0.0–0.5)
EOS%: 1.5 % (ref 0.0–7.0)
HCT: 32.9 % — ABNORMAL LOW (ref 34.8–46.6)
HGB: 11.3 g/dL — ABNORMAL LOW (ref 11.6–15.9)
LYMPH%: 31.4 % (ref 14.0–49.7)
MCH: 26.5 pg (ref 25.1–34.0)
MCHC: 34.3 g/dL (ref 31.5–36.0)
MCV: 77.2 fL — AB (ref 79.5–101.0)
MONO#: 0.9 10*3/uL (ref 0.1–0.9)
MONO%: 14.4 % — AB (ref 0.0–14.0)
NEUT%: 51.4 % (ref 38.4–76.8)
NEUTROS ABS: 3.2 10*3/uL (ref 1.5–6.5)
Platelets: 162 10*3/uL (ref 145–400)
RBC: 4.26 10*6/uL (ref 3.70–5.45)
RDW: 14.5 % (ref 11.2–14.5)
WBC: 6.1 10*3/uL (ref 3.9–10.3)
lymph#: 1.9 10*3/uL (ref 0.9–3.3)

## 2013-06-18 MED ORDER — DARBEPOETIN ALFA-POLYSORBATE 300 MCG/0.6ML IJ SOLN: 300.0000 ug | Freq: Once | INTRAMUSCULAR | Status: DC

## 2013-06-18 NOTE — Patient Instructions (Signed)
Take your blood pressure medications as prescribed and be sure to followup with your primary care physician as scheduled. Seek medical attention should you have any signs or symptoms of concern related to your blood pressure Followup in 3 months with repeat iron studies  Continue monthly CBC checks to see if can receive your monthly Aranesp and

## 2013-06-18 NOTE — Progress Notes (Addendum)
[place] [phone]   Fax:([phone]) [place], MD [address] [place] [phone]  DIAGNOSIS: Anemia of chronic disease plus/minus iron deficiency   CURRENT THERAPY: Aranesp 300 mcg subcutaneously every 4 weeks in addition to Integra Plus,1 capsule by mouth daily  INTERVAL HISTORY: Pam [name] 80 y.o. female returns to the clinic today for three-month followup visit accompanied her husband. The patient is feeling fine today with no specific complaints. She denied having any significant fatigue or weakness. She denied having any dizzy spells. She denied chest pain or shortness of breath. She reports that she is to see her primary care doctor on 06/25/2013. She also sees her cardiologist, Dr. Donnella Bi, once a year. The patient states that she took all of her blood pressure medications this morning as prescribed. The patient is tolerating her treatment with Aranesp and Integra plus fairly well. She had repeat CBC performed earlier today and she is here for evaluation and discussion of her lab results.   MEDICAL HISTORY: Past Medical History  Diagnosis Date  . Anemia   . Personal history of colonic polyps 05/01/2001    hyperplastic  . Hypoparathyroidism   . Diverticulosis   . Vitamin D deficiency   . Spinal stenosis   . Osteoporosis   . Chronic kidney disease   . Atrial fibrillation   . Hypercholesterolemia   . Hypertension   . GERD (gastroesophageal reflux disease)   . CAD (coronary artery disease)   . Carotid bruit     bilateral  . Ankle edema     ALLERGIES:  has No Known Allergies.  MEDICATIONS:  Current Outpatient Prescriptions  Medication Sig Dispense Refill  . DILT-XR 120 MG 24 hr capsule Take 120 mg by mouth daily.       Marland Kitchen FeFum-FePoly-FA-B Cmp-C-Biot (INTEGRA PLUS PO) Take 1 tablet by mouth daily.        . furosemide (LASIX) 40 MG tablet Take 1 tablet (40 mg total) by mouth daily.   30 tablet  6  . hydrALAZINE (APRESOLINE) 25 MG tablet Take 1 tablet (25 mg total) by mouth 3 (three) times daily.  270 tablet  3  . losartan (COZAAR) 100 MG tablet Take 1 tablet (100 mg total) by mouth daily.  90 tablet  3  . digoxin (LANOXIN) 0.125 MG tablet Take 125 mcg by mouth daily.        . polyethylene glycol (MIRALAX / GLYCOLAX) packet Take 17 g by mouth daily as needed.       No current facility-administered medications for this visit.    SURGICAL HISTORY:  Past Surgical History  Procedure Laterality Date  . S/p removal of bilateral kidney stones    . Abdominal hysterectomy    . Colonoscopy    . Polypectomy    . Cardioversion  01/04/2003  . Transthoracic echocardiogram  03/2004    EF normal; RA mod-severely dilated, LA mod dilated; mild MR, mild TR; aortic valve mildly sclerotic; mild pulm valve regurg  . Carotid duplex  01/10/2012    less than 50% bilat disease    REVIEW OF SYSTEMS:  A comprehensive review of systems was negative.   PHYSICAL EXAMINATION: General appearance: alert, cooperative and no distress Head: Normocephalic, without obvious abnormality, atraumatic Neck: no adenopathy Lymph nodes: Cervical, supraclavicular, and axillary nodes normal. Resp: clear to auscultation bilaterally Cardio: regular rate and rhythm, S1, S2 normal, no murmur, click, rub or gallop GI:  soft, non-tender; bowel sounds normal; no masses,  no organomegaly Extremities: extremities normal, atraumatic, no cyanosis or edema  ECOG PERFORMANCE STATUS: 1 - Symptomatic but completely ambulatory  Blood pressure 224/122, pulse 94, temperature 97.1 F (36.2 C), temperature source Oral, resp. rate 18, height 5\' 2"  (1.575 m), weight 126 lb 1.6 oz (57.199 kg). Repeat blood pressures are worse follows 221/101 with a pulse of 83, 172/101 with a pulse of 83  LABORATORY DATA: Lab Results  Component Value Date   WBC 6.1 06/18/2013   HGB 11.3* 06/18/2013   HCT 32.9* 06/18/2013   MCV 77.2* 06/18/2013    PLT 162 06/18/2013      Chemistry      Component Value Date/Time   NA 140 11/06/2012 1110   NA 139 02/07/2012 0906   K 4.4 11/06/2012 1110   K 4.4 02/07/2012 0906   CL 108* 11/06/2012 1110   CL 107 02/07/2012 0906   CO2 23 11/06/2012 1110   CO2 24 02/07/2012 0906   BUN 33.7* 11/06/2012 1110   BUN 29* 02/07/2012 0906   CREATININE 1.8* 11/06/2012 1110   CREATININE 1.7* 02/07/2012 0906      Component Value Date/Time   CALCIUM 10.8* 11/06/2012 1110   CALCIUM 10.4 02/07/2012 0906   ALKPHOS 83 11/06/2012 1110   ALKPHOS 79 02/07/2012 0906   AST 18 11/06/2012 1110   AST 20 02/07/2012 0906   ALT 12 11/06/2012 1110   ALT 13 02/07/2012 0906   BILITOT 1.64* 11/06/2012 1110   BILITOT 0.9 02/07/2012 0906       RADIOGRAPHIC STUDIES: No results found.  ASSESSMENT AND PLAN: This is a very pleasant 80 years old African American female with anemia of chronic disease secondary to renal insufficiency in addition to iron deficiency anemia. The patient is currently on treatment with Aranesp and Integra plus and tolerating it fairly well. Patient was discussed with also seen by Dr. Julien Nordmann. She is encouraged to continue taking her blood pressure pressure medications as prescribed and followup with her primary care physician as scheduled or sooner if needed. She will continue with her current treatment with Aranesp and Integra. She'll followup with Dr. Julien Nordmann in 3 months with repeat CBC differential, serum ferritin and iron and TIBC.  She was advised to call immediately if she has any concerning symptoms in the interval.  The patient voices understanding of current disease status and treatment options and is in agreement with the current care plan.  All questions were answered. The patient knows to call the clinic with any problems, questions or concerns. We can certainly see the patient much sooner if necessary.  Carlton Adam PA-C  ADDENDUM: Hematology/Oncology Attending: I had a face to face encounter with the patient. I  recommended her care plan. This is a very pleasant 80 years old [place] American female with anemia of chronic disease plus/minus deficiency. She is currently on treatment with Aranesp as well as Integra plus and tolerating her treatment well. Her hemoglobin and hematocrit are mildly low but stable. I recommended for the patient to continue on the same treatment regimen for now. She would come back for followup visit in 3 months with repeat CBC, iron study and ferritin. She was advised to call immediately if she has any concerning symptoms in the interval.  Disclaimer: This note was dictated with voice recognition software. Similar sounding words can inadvertently be transcribed and may not be corrected upon review.  Eilleen Kempf., MD 06/19/2013

## 2013-06-19 ENCOUNTER — Telehealth: Payer: Self-pay | Admitting: Internal Medicine

## 2013-06-19 NOTE — Telephone Encounter (Signed)
lmonvm for pt re next appt for 2/16. schedule mailed.

## 2013-07-16 ENCOUNTER — Ambulatory Visit: Payer: Medicare Other

## 2013-07-16 ENCOUNTER — Other Ambulatory Visit: Payer: Medicare Other

## 2013-08-13 ENCOUNTER — Ambulatory Visit (HOSPITAL_BASED_OUTPATIENT_CLINIC_OR_DEPARTMENT_OTHER): Payer: Medicare Other

## 2013-08-13 ENCOUNTER — Other Ambulatory Visit (HOSPITAL_BASED_OUTPATIENT_CLINIC_OR_DEPARTMENT_OTHER): Payer: Medicare Other

## 2013-08-13 VITALS — BP 139/69 | HR 79 | Temp 98.2°F

## 2013-08-13 DIAGNOSIS — D649 Anemia, unspecified: Secondary | ICD-10-CM

## 2013-08-13 DIAGNOSIS — N189 Chronic kidney disease, unspecified: Secondary | ICD-10-CM

## 2013-08-13 DIAGNOSIS — N039 Chronic nephritic syndrome with unspecified morphologic changes: Secondary | ICD-10-CM

## 2013-08-13 DIAGNOSIS — D631 Anemia in chronic kidney disease: Secondary | ICD-10-CM

## 2013-08-13 DIAGNOSIS — N289 Disorder of kidney and ureter, unspecified: Secondary | ICD-10-CM

## 2013-08-13 LAB — CBC WITH DIFFERENTIAL/PLATELET
BASO%: 1.2 % (ref 0.0–2.0)
Basophils Absolute: 0.1 10*3/uL (ref 0.0–0.1)
EOS ABS: 0.1 10*3/uL (ref 0.0–0.5)
EOS%: 0.9 % (ref 0.0–7.0)
HCT: 30.9 % — ABNORMAL LOW (ref 34.8–46.6)
HGB: 10.6 g/dL — ABNORMAL LOW (ref 11.6–15.9)
LYMPH#: 1.9 10*3/uL (ref 0.9–3.3)
LYMPH%: 29.3 % (ref 14.0–49.7)
MCH: 26.8 pg (ref 25.1–34.0)
MCHC: 34.3 g/dL (ref 31.5–36.0)
MCV: 78.2 fL — ABNORMAL LOW (ref 79.5–101.0)
MONO#: 0.9 10*3/uL (ref 0.1–0.9)
MONO%: 13.3 % (ref 0.0–14.0)
NEUT%: 55.3 % (ref 38.4–76.8)
NEUTROS ABS: 3.7 10*3/uL (ref 1.5–6.5)
PLATELETS: 206 10*3/uL (ref 145–400)
RBC: 3.95 10*6/uL (ref 3.70–5.45)
RDW: 13.9 % (ref 11.2–14.5)
WBC: 6.6 10*3/uL (ref 3.9–10.3)
nRBC: 0 % (ref 0–0)

## 2013-08-13 MED ORDER — DARBEPOETIN ALFA-POLYSORBATE 300 MCG/0.6ML IJ SOLN
300.0000 ug | Freq: Once | INTRAMUSCULAR | Status: AC
  Administered 2013-08-13: 300 ug via SUBCUTANEOUS
  Filled 2013-08-13: qty 0.6

## 2013-09-10 ENCOUNTER — Ambulatory Visit (HOSPITAL_BASED_OUTPATIENT_CLINIC_OR_DEPARTMENT_OTHER): Payer: Medicare Other | Admitting: Internal Medicine

## 2013-09-10 ENCOUNTER — Ambulatory Visit: Payer: Medicare Other

## 2013-09-10 ENCOUNTER — Other Ambulatory Visit (HOSPITAL_BASED_OUTPATIENT_CLINIC_OR_DEPARTMENT_OTHER): Payer: Medicare Other

## 2013-09-10 ENCOUNTER — Encounter: Payer: Self-pay | Admitting: Internal Medicine

## 2013-09-10 ENCOUNTER — Telehealth: Payer: Self-pay | Admitting: Internal Medicine

## 2013-09-10 VITALS — BP 160/82 | HR 77 | Temp 97.8°F | Resp 17 | Ht 62.0 in | Wt 120.7 lb

## 2013-09-10 DIAGNOSIS — D649 Anemia, unspecified: Secondary | ICD-10-CM

## 2013-09-10 DIAGNOSIS — N039 Chronic nephritic syndrome with unspecified morphologic changes: Secondary | ICD-10-CM

## 2013-09-10 DIAGNOSIS — N189 Chronic kidney disease, unspecified: Secondary | ICD-10-CM

## 2013-09-10 DIAGNOSIS — D631 Anemia in chronic kidney disease: Secondary | ICD-10-CM

## 2013-09-10 DIAGNOSIS — D509 Iron deficiency anemia, unspecified: Secondary | ICD-10-CM

## 2013-09-10 DIAGNOSIS — N289 Disorder of kidney and ureter, unspecified: Secondary | ICD-10-CM

## 2013-09-10 LAB — IRON AND TIBC CHCC
%SAT: 36 % (ref 21–57)
Iron: 100 ug/dL (ref 41–142)
TIBC: 280 ug/dL (ref 236–444)
UIBC: 180 ug/dL (ref 120–384)

## 2013-09-10 LAB — CBC WITH DIFFERENTIAL/PLATELET
BASO%: 2.1 % — ABNORMAL HIGH (ref 0.0–2.0)
Basophils Absolute: 0.1 10*3/uL (ref 0.0–0.1)
EOS%: 0.8 % (ref 0.0–7.0)
Eosinophils Absolute: 0 10*3/uL (ref 0.0–0.5)
HEMATOCRIT: 41 % (ref 34.8–46.6)
HEMOGLOBIN: 13.8 g/dL (ref 11.6–15.9)
LYMPH%: 22.4 % (ref 14.0–49.7)
MCH: 27.7 pg (ref 25.1–34.0)
MCHC: 33.7 g/dL (ref 31.5–36.0)
MCV: 82.2 fL (ref 79.5–101.0)
MONO#: 0.8 10*3/uL (ref 0.1–0.9)
MONO%: 15.2 % — AB (ref 0.0–14.0)
NEUT#: 3.1 10*3/uL (ref 1.5–6.5)
NEUT%: 59.5 % (ref 38.4–76.8)
Platelets: 173 10*3/uL (ref 145–400)
RBC: 4.99 10*6/uL (ref 3.70–5.45)
RDW: 15 % — ABNORMAL HIGH (ref 11.2–14.5)
WBC: 5.3 10*3/uL (ref 3.9–10.3)
lymph#: 1.2 10*3/uL (ref 0.9–3.3)

## 2013-09-10 LAB — FERRITIN CHCC: FERRITIN: 281 ng/mL — AB (ref 9–269)

## 2013-09-10 MED ORDER — DARBEPOETIN ALFA-POLYSORBATE 300 MCG/0.6ML IJ SOLN: 300.0000 ug | Freq: Once | INTRAMUSCULAR | Status: DC

## 2013-09-10 NOTE — Telephone Encounter (Signed)
Gave pt appt for lab and injections until july 2015 with Md visit

## 2013-09-10 NOTE — Patient Instructions (Signed)
You Can Quit Smoking If you are ready to quit smoking or are thinking about it, congratulations! You have chosen to help yourself be healthier and live longer! There are lots of different ways to quit smoking. Nicotine gum, nicotine patches, a nicotine inhaler, or nicotine nasal spray can help with physical craving. Hypnosis, support groups, and medicines help break the habit of smoking. TIPS TO GET OFF AND STAY OFF CIGARETTES  Learn to predict your moods. Do not let a bad situation be your excuse to have a cigarette. Some situations in your life might tempt you to have a cigarette.  Ask friends and co-workers not to smoke around you.  Make your home smoke-free.  Never have "just one" cigarette. It leads to wanting another and another. Remind yourself of your decision to quit.  On a card, make a list of your reasons for not smoking. Read it at least the same number of times a day as you have a cigarette. Tell yourself everyday, "I do not want to smoke. I choose not to smoke."  Ask someone at home or work to help you with your plan to quit smoking.  Have something planned after you eat or have a cup of coffee. Take a walk or get other exercise to perk you up. This will help to keep you from overeating.  Try a relaxation exercise to calm you down and decrease your stress. Remember, you may be tense and nervous the first two weeks after you quit. This will pass.  Find new activities to keep your hands busy. Play with a pen, coin, or rubber band. Doodle or draw things on paper.  Brush your teeth right after eating. This will help cut down the craving for the taste of tobacco after meals. You can try mouthwash too.  Try gum, breath mints, or diet candy to keep something in your mouth. IF YOU SMOKE AND WANT TO QUIT:  Do not stock up on cigarettes. Never buy a carton. Wait until one pack is finished before you buy another.  Never carry cigarettes with you at work or at home.  Keep cigarettes  as far away from you as possible. Leave them with someone else.  Never carry matches or a lighter with you.  Ask yourself, "Do I need this cigarette or is this just a reflex?"  Bet with someone that you can quit. Put cigarette money in a piggy bank every morning. If you smoke, you give up the money. If you do not smoke, by the end of the week, you keep the money.  Keep trying. It takes 21 days to change a habit!  Talk to your doctor about using medicines to help you quit. These include nicotine replacement gum, lozenges, or skin patches. Document Released: 03/13/2009 Document Revised: 08/09/2011 Document Reviewed: 03/13/2009 [employer] Patient Information 2014 [employer].  

## 2013-09-10 NOTE — Progress Notes (Signed)
[facility] Telephone:(336) [phone]   Fax:(336) [place], MD [address] [place] [phone]  DIAGNOSIS: Anemia of chronic disease plus/minus iron deficiency   CURRENT THERAPY: Aranesp 300 mcg subcutaneously every 4 weeks in addition to Integra Plus,1 capsule by mouth daily  INTERVAL HISTORY: Pam [name] 80 y.o. female returns to the clinic today for three-month followup visit. She lost her husband recently secondary to heart attack in February of 2015. The patient is feeling fine today with no specific complaints. She denied having any significant fatigue or weakness. She denied having any dizzy spells. The patient is tolerating her treatment with Aranesp and Integra plus fairly well. She had repeat CBC performed earlier today and she is here for evaluation and discussion of her lab results.   MEDICAL HISTORY: Past Medical History  Diagnosis Date  . Anemia   . Personal history of colonic polyps 05/01/2001    hyperplastic  . Hypoparathyroidism   . Diverticulosis   . Vitamin D deficiency   . Spinal stenosis   . Osteoporosis   . Chronic kidney disease   . Atrial fibrillation   . Hypercholesterolemia   . Hypertension   . GERD (gastroesophageal reflux disease)   . CAD (coronary artery disease)   . Carotid bruit     bilateral  . Ankle edema     ALLERGIES:  has No Known Allergies.  MEDICATIONS:  Current Outpatient Prescriptions  Medication Sig Dispense Refill  . DILT-XR 120 MG 24 hr capsule Take 120 mg by mouth daily.       Marland Kitchen FeFum-FePoly-FA-B Cmp-C-Biot (INTEGRA PLUS PO) Take 1 tablet by mouth daily.        . hydrALAZINE (APRESOLINE) 25 MG tablet Take 1 tablet (25 mg total) by mouth 3 (three) times daily.  270 tablet  3  . losartan (COZAAR) 100 MG tablet Take 1 tablet (100 mg total) by mouth daily.  90 tablet  3  . polyethylene glycol (MIRALAX / GLYCOLAX) packet Take 17 g by mouth daily as needed.      .  furosemide (LASIX) 40 MG tablet Take 1 tablet (40 mg total) by mouth daily.  30 tablet  6   No current facility-administered medications for this visit.    SURGICAL HISTORY:  Past Surgical History  Procedure Laterality Date  . S/p removal of bilateral kidney stones    . Abdominal hysterectomy    . Colonoscopy    . Polypectomy    . Cardioversion  01/04/2003  . Transthoracic echocardiogram  03/2004    EF normal; RA mod-severely dilated, LA mod dilated; mild MR, mild TR; aortic valve mildly sclerotic; mild pulm valve regurg  . Carotid duplex  01/10/2012    less than 50% bilat disease    REVIEW OF SYSTEMS:  A comprehensive review of systems was negative.   PHYSICAL EXAMINATION: General appearance: alert, cooperative and no distress Head: Normocephalic, without obvious abnormality, atraumatic Neck: no adenopathy Lymph nodes: Cervical, supraclavicular, and axillary nodes normal. Resp: clear to auscultation bilaterally Cardio: regular rate and rhythm, S1, S2 normal, no murmur, click, rub or gallop GI: soft, non-tender; bowel sounds normal; no masses,  no organomegaly Extremities: extremities normal, atraumatic, no cyanosis or edema  ECOG PERFORMANCE STATUS: 1 - Symptomatic but completely ambulatory  Blood pressure 160/82, pulse 77, temperature 97.8 F (36.6 C), temperature source Oral, resp. rate 17, height 5\' 2"  (1.575 m), weight 120 lb 11.2 oz (54.749 kg), SpO2  99.00%.  LABORATORY DATA: Lab Results  Component Value Date   WBC 5.3 09/10/2013   HGB 13.8 09/10/2013   HCT 41.0 09/10/2013   MCV 82.2 09/10/2013   PLT 173 09/10/2013      Chemistry      Component Value Date/Time   NA 140 11/06/2012 1110   NA 139 02/07/2012 0906   K 4.4 11/06/2012 1110   K 4.4 02/07/2012 0906   CL 108* 11/06/2012 1110   CL 107 02/07/2012 0906   CO2 23 11/06/2012 1110   CO2 24 02/07/2012 0906   BUN 33.7* 11/06/2012 1110   BUN 29* 02/07/2012 0906   CREATININE 1.8* 11/06/2012 1110   CREATININE 1.7* 02/07/2012 0906        Component Value Date/Time   CALCIUM 10.8* 11/06/2012 1110   CALCIUM 10.4 02/07/2012 0906   ALKPHOS 83 11/06/2012 1110   ALKPHOS 79 02/07/2012 0906   AST 18 11/06/2012 1110   AST 20 02/07/2012 0906   ALT 12 11/06/2012 1110   ALT 13 02/07/2012 0906   BILITOT 1.64* 11/06/2012 1110   BILITOT 0.9 02/07/2012 0906       RADIOGRAPHIC STUDIES: No results found.  ASSESSMENT AND PLAN: This is a very pleasant 80 years old African American female with anemia of chronic disease secondary to renal insufficiency in addition to iron deficiency anemia. The patient is currently on treatment with Aranesp and Integra plus and tolerating it fairly well. Her CBC today is unremarkable for anemia. I recommended for the patient to continue her current treatment. She would come back for followup visit in 3 months with repeat CBC, iron study and ferritin. She was advised to call immediately if she has any concerning symptoms in the interval.  The patient voices understanding of current disease status and treatment options and is in agreement with the current care plan.  All questions were answered. The patient knows to call the clinic with any problems, questions or concerns. We can certainly see the patient much sooner if necessary.  Disclaimer: This note was dictated with voice recognition software. Similar sounding words can inadvertently be transcribed and may not be corrected upon review.

## 2013-10-01 ENCOUNTER — Ambulatory Visit: Payer: Medicare Other

## 2013-10-01 ENCOUNTER — Other Ambulatory Visit (HOSPITAL_BASED_OUTPATIENT_CLINIC_OR_DEPARTMENT_OTHER): Payer: Medicare Other

## 2013-10-01 DIAGNOSIS — D649 Anemia, unspecified: Secondary | ICD-10-CM

## 2013-10-01 DIAGNOSIS — N289 Disorder of kidney and ureter, unspecified: Secondary | ICD-10-CM

## 2013-10-01 DIAGNOSIS — D631 Anemia in chronic kidney disease: Secondary | ICD-10-CM

## 2013-10-01 DIAGNOSIS — N039 Chronic nephritic syndrome with unspecified morphologic changes: Secondary | ICD-10-CM

## 2013-10-01 LAB — CBC WITH DIFFERENTIAL/PLATELET
BASO%: 1.1 % (ref 0.0–2.0)
BASOS ABS: 0.1 10*3/uL (ref 0.0–0.1)
EOS%: 1 % (ref 0.0–7.0)
Eosinophils Absolute: 0.1 10*3/uL (ref 0.0–0.5)
HCT: 37.2 % (ref 34.8–46.6)
HGB: 12.8 g/dL (ref 11.6–15.9)
LYMPH%: 27.2 % (ref 14.0–49.7)
MCH: 27.6 pg (ref 25.1–34.0)
MCHC: 34.4 g/dL (ref 31.5–36.0)
MCV: 80.3 fL (ref 79.5–101.0)
MONO#: 0.7 10*3/uL (ref 0.1–0.9)
MONO%: 11.1 % (ref 0.0–14.0)
NEUT#: 3.7 10*3/uL (ref 1.5–6.5)
NEUT%: 59.6 % (ref 38.4–76.8)
NRBC: 0 % (ref 0–0)
Platelets: 158 10*3/uL (ref 145–400)
RBC: 4.63 10*6/uL (ref 3.70–5.45)
RDW: 14.3 % (ref 11.2–14.5)
WBC: 6.2 10*3/uL (ref 3.9–10.3)
lymph#: 1.7 10*3/uL (ref 0.9–3.3)

## 2013-10-01 MED ORDER — DARBEPOETIN ALFA-POLYSORBATE 300 MCG/0.6ML IJ SOLN: 300.0000 ug | Freq: Once | INTRAMUSCULAR | Status: DC

## 2013-10-19 ENCOUNTER — Other Ambulatory Visit: Payer: Self-pay | Admitting: *Deleted

## 2013-10-19 DIAGNOSIS — D649 Anemia, unspecified: Secondary | ICD-10-CM

## 2013-10-23 ENCOUNTER — Other Ambulatory Visit (HOSPITAL_BASED_OUTPATIENT_CLINIC_OR_DEPARTMENT_OTHER): Payer: Medicare Other

## 2013-10-23 ENCOUNTER — Ambulatory Visit (HOSPITAL_BASED_OUTPATIENT_CLINIC_OR_DEPARTMENT_OTHER): Payer: Medicare Other

## 2013-10-23 VITALS — BP 121/44 | HR 68 | Temp 98.1°F

## 2013-10-23 DIAGNOSIS — D631 Anemia in chronic kidney disease: Secondary | ICD-10-CM

## 2013-10-23 DIAGNOSIS — D649 Anemia, unspecified: Secondary | ICD-10-CM

## 2013-10-23 DIAGNOSIS — N289 Disorder of kidney and ureter, unspecified: Secondary | ICD-10-CM

## 2013-10-23 DIAGNOSIS — D638 Anemia in other chronic diseases classified elsewhere: Secondary | ICD-10-CM

## 2013-10-23 DIAGNOSIS — N189 Chronic kidney disease, unspecified: Secondary | ICD-10-CM

## 2013-10-23 DIAGNOSIS — N039 Chronic nephritic syndrome with unspecified morphologic changes: Secondary | ICD-10-CM

## 2013-10-23 LAB — CBC WITH DIFFERENTIAL/PLATELET
BASO%: 0.7 % (ref 0.0–2.0)
Basophils Absolute: 0.1 10*3/uL (ref 0.0–0.1)
EOS%: 0.9 % (ref 0.0–7.0)
Eosinophils Absolute: 0.1 10*3/uL (ref 0.0–0.5)
HCT: 31.8 % — ABNORMAL LOW (ref 34.8–46.6)
HGB: 10.9 g/dL — ABNORMAL LOW (ref 11.6–15.9)
LYMPH%: 24.3 % (ref 14.0–49.7)
MCH: 27.4 pg (ref 25.1–34.0)
MCHC: 34.3 g/dL (ref 31.5–36.0)
MCV: 79.9 fL (ref 79.5–101.0)
MONO#: 0.9 10*3/uL (ref 0.1–0.9)
MONO%: 12.6 % (ref 0.0–14.0)
NEUT#: 4.6 10*3/uL (ref 1.5–6.5)
NEUT%: 61.5 % (ref 38.4–76.8)
PLATELETS: 157 10*3/uL (ref 145–400)
RBC: 3.98 10*6/uL (ref 3.70–5.45)
RDW: 14.1 % (ref 11.2–14.5)
WBC: 7.4 10*3/uL (ref 3.9–10.3)
lymph#: 1.8 10*3/uL (ref 0.9–3.3)
nRBC: 0 % (ref 0–0)

## 2013-10-23 MED ORDER — DARBEPOETIN ALFA-POLYSORBATE 300 MCG/0.6ML IJ SOLN
300.0000 ug | Freq: Once | INTRAMUSCULAR | Status: AC
  Administered 2013-10-23: 300 ug via SUBCUTANEOUS
  Filled 2013-10-23: qty 0.6

## 2013-10-23 NOTE — Patient Instructions (Signed)
Darbepoetin Alfa injection What is this medicine? DARBEPOETIN ALFA (dar be POE e tin AL fa) helps your body make more red blood cells. It is used to treat anemia caused by chronic kidney failure and chemotherapy. This medicine may be used for other purposes; ask your health care provider or pharmacist if you have questions. COMMON BRAND NAME(S): Aranesp What should I tell my health care provider before I take this medicine? They need to know if you have any of these conditions: -blood clotting disorders or history of blood clots -cancer patient not on chemotherapy -cystic fibrosis -heart disease, such as angina, heart failure, or a history of a heart attack -hemoglobin level of 12 g/dL or greater -high blood pressure -low levels of folate, iron, or vitamin B12 -seizures -an unusual or allergic reaction to darbepoetin, erythropoietin, albumin, hamster proteins, latex, other medicines, foods, dyes, or preservatives -pregnant or trying to get pregnant -breast-feeding How should I use this medicine? This medicine is for injection into a vein or under the skin. It is usually given by a health care professional in a hospital or clinic setting. If you get this medicine at home, you will be taught how to prepare and give this medicine. Do not shake the solution before you withdraw a dose. Use exactly as directed. Take your medicine at regular intervals. Do not take your medicine more often than directed. It is important that you put your used needles and syringes in a special sharps container. Do not put them in a trash can. If you do not have a sharps container, call your pharmacist or healthcare provider to get one. Talk to your pediatrician regarding the use of this medicine in children. While this medicine may be used in children as young as 1 year for selected conditions, precautions do apply. Overdosage: If you think you have taken too much of this medicine contact a poison control center or  emergency room at once. NOTE: This medicine is only for you. Do not share this medicine with others. What if I miss a dose? If you miss a dose, take it as soon as you can. If it is almost time for your next dose, take only that dose. Do not take double or extra doses. What may interact with this medicine? Do not take this medicine with any of the following medications: -epoetin alfa This list may not describe all possible interactions. Give your health care provider a list of all the medicines, herbs, non-prescription drugs, or dietary supplements you use. Also tell them if you smoke, drink alcohol, or use illegal drugs. Some items may interact with your medicine. What should I watch for while using this medicine? Visit your prescriber or health care professional for regular checks on your progress and for the needed blood tests and blood pressure measurements. It is especially important for the doctor to make sure your hemoglobin level is in the desired range, to limit the risk of potential side effects and to give you the best benefit. Keep all appointments for any recommended tests. Check your blood pressure as directed. Ask your doctor what your blood pressure should be and when you should contact him or her. As your body makes more red blood cells, you may need to take iron, folic acid, or vitamin B supplements. Ask your doctor or health care provider which products are right for you. If you have kidney disease continue dietary restrictions, even though this medication can make you feel better. Talk with your doctor or health   care professional about the foods you eat and the vitamins that you take. What side effects may I notice from receiving this medicine? Side effects that you should report to your doctor or health care professional as soon as possible: -allergic reactions like skin rash, itching or hives, swelling of the face, lips, or tongue -breathing problems -changes in vision -chest  pain -confusion, trouble speaking or understanding -feeling faint or lightheaded, falls -high blood pressure -muscle aches or pains -pain, swelling, warmth in the leg -rapid weight gain -severe headaches -sudden numbness or weakness of the face, arm or leg -trouble walking, dizziness, loss of balance or coordination -seizures (convulsions) -swelling of the ankles, feet, hands -unusually weak or tired Side effects that usually do not require medical attention (report to your doctor or health care professional if they continue or are bothersome): -diarrhea -fever, chills (flu-like symptoms) -headaches -nausea, vomiting -redness, stinging, or swelling at site where injected This list may not describe all possible side effects. Call your doctor for medical advice about side effects. You may report side effects to FDA at 1-800-FDA-1088. Where should I keep my medicine? Keep out of the reach of children. Store in a refrigerator between 2 and 8 degrees C (36 and 46 degrees F). Do not freeze. Do not shake. Throw away any unused portion if using a single-dose vial. Throw away any unused medicine after the expiration date. NOTE: This sheet is a summary. It may not cover all possible information. If you have questions about this medicine, talk to your doctor, pharmacist, or health care provider.  2014, [employer]. (2008-04-30 10:23:57)  

## 2013-11-12 ENCOUNTER — Ambulatory Visit: Payer: Medicare Other

## 2013-11-12 ENCOUNTER — Other Ambulatory Visit (HOSPITAL_BASED_OUTPATIENT_CLINIC_OR_DEPARTMENT_OTHER): Payer: Medicare Other

## 2013-11-12 DIAGNOSIS — N039 Chronic nephritic syndrome with unspecified morphologic changes: Secondary | ICD-10-CM

## 2013-11-12 DIAGNOSIS — D649 Anemia, unspecified: Secondary | ICD-10-CM

## 2013-11-12 DIAGNOSIS — D631 Anemia in chronic kidney disease: Secondary | ICD-10-CM

## 2013-11-12 DIAGNOSIS — D509 Iron deficiency anemia, unspecified: Secondary | ICD-10-CM

## 2013-11-12 DIAGNOSIS — N189 Chronic kidney disease, unspecified: Secondary | ICD-10-CM

## 2013-11-12 DIAGNOSIS — N289 Disorder of kidney and ureter, unspecified: Secondary | ICD-10-CM

## 2013-11-12 LAB — CBC WITH DIFFERENTIAL/PLATELET
BASO%: 2 % (ref 0.0–2.0)
Basophils Absolute: 0.1 10*3/uL (ref 0.0–0.1)
EOS%: 1.1 % (ref 0.0–7.0)
Eosinophils Absolute: 0.1 10*3/uL (ref 0.0–0.5)
HCT: 35.5 % (ref 34.8–46.6)
HEMOGLOBIN: 12.3 g/dL (ref 11.6–15.9)
LYMPH#: 1.1 10*3/uL (ref 0.9–3.3)
LYMPH%: 20.2 % (ref 14.0–49.7)
MCH: 29 pg (ref 25.1–34.0)
MCHC: 34.6 g/dL (ref 31.5–36.0)
MCV: 83.7 fL (ref 79.5–101.0)
MONO#: 1 10*3/uL — ABNORMAL HIGH (ref 0.1–0.9)
MONO%: 17.1 % — AB (ref 0.0–14.0)
NEUT#: 3.3 10*3/uL (ref 1.5–6.5)
NEUT%: 59.6 % (ref 38.4–76.8)
Platelets: 188 10*3/uL (ref 145–400)
RBC: 4.24 10*6/uL (ref 3.70–5.45)
RDW: 17.1 % — AB (ref 11.2–14.5)
WBC: 5.6 10*3/uL (ref 3.9–10.3)

## 2013-11-12 MED ORDER — DARBEPOETIN ALFA-POLYSORBATE 300 MCG/0.6ML IJ SOLN: 300.0000 ug | Freq: Once | INTRAMUSCULAR | Status: DC

## 2013-12-03 ENCOUNTER — Ambulatory Visit: Payer: Medicare Other | Admitting: Internal Medicine

## 2013-12-03 ENCOUNTER — Other Ambulatory Visit (HOSPITAL_BASED_OUTPATIENT_CLINIC_OR_DEPARTMENT_OTHER): Payer: Medicare Other

## 2013-12-03 ENCOUNTER — Ambulatory Visit: Payer: Medicare Other

## 2013-12-03 ENCOUNTER — Other Ambulatory Visit (HOSPITAL_BASED_OUTPATIENT_CLINIC_OR_DEPARTMENT_OTHER): Payer: Medicare Other | Admitting: Family

## 2013-12-03 DIAGNOSIS — D649 Anemia, unspecified: Secondary | ICD-10-CM

## 2013-12-03 DIAGNOSIS — D631 Anemia in chronic kidney disease: Secondary | ICD-10-CM

## 2013-12-03 DIAGNOSIS — N289 Disorder of kidney and ureter, unspecified: Secondary | ICD-10-CM

## 2013-12-03 DIAGNOSIS — N039 Chronic nephritic syndrome with unspecified morphologic changes: Secondary | ICD-10-CM

## 2013-12-03 DIAGNOSIS — D509 Iron deficiency anemia, unspecified: Secondary | ICD-10-CM

## 2013-12-03 DIAGNOSIS — N189 Chronic kidney disease, unspecified: Secondary | ICD-10-CM

## 2013-12-03 LAB — CBC WITH DIFFERENTIAL/PLATELET
BASO%: 1 % (ref 0.0–2.0)
BASOS ABS: 0.1 10*3/uL (ref 0.0–0.1)
EOS%: 0.9 % (ref 0.0–7.0)
Eosinophils Absolute: 0.1 10*3/uL (ref 0.0–0.5)
HCT: 39.2 % (ref 34.8–46.6)
HEMOGLOBIN: 12.6 g/dL (ref 11.6–15.9)
LYMPH%: 22.3 % (ref 14.0–49.7)
MCH: 28.4 pg (ref 25.1–34.0)
MCHC: 32.3 g/dL (ref 31.5–36.0)
MCV: 87.9 fL (ref 79.5–101.0)
MONO#: 0.8 10*3/uL (ref 0.1–0.9)
MONO%: 11.7 % (ref 0.0–14.0)
NEUT#: 4.2 10*3/uL (ref 1.5–6.5)
NEUT%: 64.1 % (ref 38.4–76.8)
Platelets: 166 10*3/uL (ref 145–400)
RBC: 4.45 10*6/uL (ref 3.70–5.45)
RDW: 15.3 % — ABNORMAL HIGH (ref 11.2–14.5)
WBC: 6.5 10*3/uL (ref 3.9–10.3)
lymph#: 1.4 10*3/uL (ref 0.9–3.3)

## 2013-12-03 LAB — IRON AND TIBC CHCC
%SAT: 35 % (ref 21–57)
IRON: 95 ug/dL (ref 41–142)
TIBC: 275 ug/dL (ref 236–444)
UIBC: 180 ug/dL (ref 120–384)

## 2013-12-03 LAB — FERRITIN CHCC: Ferritin: 562 ng/ml — ABNORMAL HIGH (ref 9–269)

## 2013-12-03 MED ORDER — DARBEPOETIN ALFA-POLYSORBATE 300 MCG/0.6ML IJ SOLN: 300.0000 ug | Freq: Once | INTRAMUSCULAR | Status: DC

## 2013-12-10 ENCOUNTER — Ambulatory Visit: Payer: Medicare Other | Admitting: Internal Medicine

## 2013-12-10 ENCOUNTER — Other Ambulatory Visit: Payer: Medicare Other

## 2013-12-10 ENCOUNTER — Ambulatory Visit: Payer: Medicare Other

## 2013-12-14 ENCOUNTER — Telehealth: Payer: Self-pay | Admitting: Internal Medicine

## 2013-12-14 NOTE — Telephone Encounter (Signed)
lvm for pt regarding to Aug appt....mailed pt appt sched and letter

## 2013-12-28 ENCOUNTER — Other Ambulatory Visit: Payer: Self-pay

## 2013-12-28 DIAGNOSIS — N289 Disorder of kidney and ureter, unspecified: Secondary | ICD-10-CM

## 2013-12-28 DIAGNOSIS — D649 Anemia, unspecified: Secondary | ICD-10-CM

## 2013-12-29 ENCOUNTER — Emergency Department (HOSPITAL_COMMUNITY)
Admission: EM | Admit: 2013-12-29 | Discharge: 2013-12-29 | Disposition: A | Discharge status: Home / Self Care | Payer: Medicare Other | Attending: Emergency Medicine | Admitting: Emergency Medicine

## 2013-12-29 ENCOUNTER — Encounter (HOSPITAL_COMMUNITY): Payer: Self-pay | Admitting: Emergency Medicine

## 2013-12-29 DIAGNOSIS — Z860100 Personal history of colon polyps, unspecified: Secondary | ICD-10-CM | POA: Insufficient documentation

## 2013-12-29 DIAGNOSIS — M79609 Pain in unspecified limb: Secondary | ICD-10-CM | POA: Insufficient documentation

## 2013-12-29 DIAGNOSIS — Z8719 Personal history of other diseases of the digestive system: Secondary | ICD-10-CM | POA: Insufficient documentation

## 2013-12-29 DIAGNOSIS — Z79899 Other long term (current) drug therapy: Secondary | ICD-10-CM | POA: Insufficient documentation

## 2013-12-29 DIAGNOSIS — I129 Hypertensive chronic kidney disease with stage 1 through stage 4 chronic kidney disease, or unspecified chronic kidney disease: Secondary | ICD-10-CM | POA: Diagnosis not present

## 2013-12-29 DIAGNOSIS — I4891 Unspecified atrial fibrillation: Secondary | ICD-10-CM | POA: Diagnosis not present

## 2013-12-29 DIAGNOSIS — N189 Chronic kidney disease, unspecified: Secondary | ICD-10-CM | POA: Insufficient documentation

## 2013-12-29 DIAGNOSIS — IMO0001 Reserved for inherently not codable concepts without codable children: Secondary | ICD-10-CM | POA: Insufficient documentation

## 2013-12-29 DIAGNOSIS — Z862 Personal history of diseases of the blood and blood-forming organs and certain disorders involving the immune mechanism: Secondary | ICD-10-CM | POA: Diagnosis not present

## 2013-12-29 DIAGNOSIS — I2510 Atherosclerotic heart disease of native coronary artery without angina pectoris: Secondary | ICD-10-CM | POA: Insufficient documentation

## 2013-12-29 DIAGNOSIS — Z8639 Personal history of other endocrine, nutritional and metabolic disease: Secondary | ICD-10-CM | POA: Insufficient documentation

## 2013-12-29 DIAGNOSIS — M7918 Myalgia, other site: Secondary | ICD-10-CM

## 2013-12-29 DIAGNOSIS — F17200 Nicotine dependence, unspecified, uncomplicated: Secondary | ICD-10-CM | POA: Diagnosis not present

## 2013-12-29 LAB — BASIC METABOLIC PANEL
Anion gap: 13 (ref 5–15)
BUN: 43 mg/dL — AB (ref 6–23)
CO2: 22 mEq/L (ref 19–32)
Calcium: 11.2 mg/dL — ABNORMAL HIGH (ref 8.4–10.5)
Chloride: 102 mEq/L (ref 96–112)
Creatinine, Ser: 1.95 mg/dL — ABNORMAL HIGH (ref 0.50–1.10)
GFR calc Af Amer: 26 mL/min — ABNORMAL LOW (ref >90)
GFR calc non Af Amer: 22 mL/min — ABNORMAL LOW (ref >90)
GLUCOSE: 81 mg/dL (ref 70–99)
POTASSIUM: 5.1 meq/L (ref 3.7–5.3)
SODIUM: 137 meq/L (ref 137–147)

## 2013-12-29 LAB — CBC
HCT: 34.1 % — ABNORMAL LOW (ref 36.0–46.0)
HEMOGLOBIN: 12 g/dL (ref 12.0–15.0)
MCH: 28.6 pg (ref 26.0–34.0)
MCHC: 35.2 g/dL (ref 30.0–36.0)
MCV: 81.2 fL (ref 78.0–100.0)
Platelets: 161 10*3/uL (ref 150–400)
RBC: 4.2 MIL/uL (ref 3.87–5.11)
RDW: 14.4 % (ref 11.5–15.5)
WBC: 5.7 10*3/uL (ref 4.0–10.5)

## 2013-12-29 LAB — CK: Total CK: 75 U/L (ref 7–177)

## 2013-12-29 MED ORDER — TRAMADOL HCL 50 MG PO TABS
50.0000 mg | ORAL_TABLET | Freq: Once | ORAL | Status: AC
  Administered 2013-12-29: 50 mg via ORAL
  Filled 2013-12-29: qty 1

## 2013-12-29 MED ORDER — TRAMADOL HCL 50 MG PO TABS: 50.0000 mg | ORAL_TABLET | Freq: Four times a day (QID) | ORAL | Status: DC | PRN

## 2013-12-29 NOTE — ED Provider Notes (Signed)
I saw and evaluated the patient, reviewed the resident's note and I agree with the findings and plan.  Pt c/o bilateral anterior leg pain. No sts. No skin changes or erythema. No focal bony or muscular tenderness. No edema. Distal pulses palp bil.  Good rom bil lower ext without pain. Pt not on any statin rx.  Labs. Discussed diff dx including restless legs, leg cramps, secondary to spinal stenosis, other pre-clinically evident cause.  Pt afeb. No sign of infection.  Pt appears stable for d/c. rec close pcp follow up Monday.      Mirna Mires, MD 12/29/13 [phone]

## 2013-12-29 NOTE — ED Provider Notes (Signed)
CSN: [id number]     Arrival date & time 12/29/13  1112 History   First MD Initiated Contact with Patient 12/29/13 1149     Chief Complaint  Patient presents with  . Leg Pain     HPI  Pam [name] is an 80 year old female with a history of hypertension, chronic anemia, chronic atrial fibrillation (rate controlled), renal insufficiency, and carotid disease who presents with a one-day history of bilateral leg pain. She reports that the pain in started yesterday without any obvious injury or insult. She describes it as an aching pain that is worse in her thighs and less severe in her calves. The pain is exacerbated by walking or although is still presents with rest. Her walking is limited by pain to less than a block. She reports a trial of some sample medications that her primary care provider gave her but does not remember the name of the medication.  Since yesterday, her pain is much improved (10 out of 10 yesterday, 3/10 presently). Otherwise, she denies any fevers, chills, chest pain, shortness of breath, abdominal pain, dysuria, hematuria, nausea, or vomiting.  Past Medical History  Diagnosis Date  . Anemia   . Personal history of colonic polyps 05/01/2001    hyperplastic  . Hypoparathyroidism   . Diverticulosis   . Vitamin D deficiency   . Spinal stenosis   . Osteoporosis   . Chronic kidney disease   . Atrial fibrillation   . Hypercholesterolemia   . Hypertension   . GERD (gastroesophageal reflux disease)   . CAD (coronary artery disease)   . Carotid bruit     bilateral  . Ankle edema    Past Surgical History  Procedure Laterality Date  . S/p removal of bilateral kidney stones    . Abdominal hysterectomy    . Colonoscopy    . Polypectomy    . Cardioversion  01/04/2003  . Transthoracic echocardiogram  03/2004    EF normal; RA mod-severely dilated, LA mod dilated; mild MR, mild TR; aortic valve mildly sclerotic; mild pulm valve regurg  . Carotid duplex  01/10/2012    less than  50% bilat disease   Family History  Problem Relation Age of Onset  . Heart disease Mother   . Heart disease Father   . Colon cancer Neg Hx    History  Substance Use Topics  . Smoking status: Current Some Day Smoker    Types: Cigarettes  . Smokeless tobacco: Never Used     Comment: smokes 2-3 cigs daily  . Alcohol Use: No     Comment: occ   OB History   Grav Para Term Preterm Abortions TAB SAB Ect Mult Living                 Review of Systems  Per history of present illness.  Allergies  Review of patient's allergies indicates no known allergies.  Home Medications   Prior to Admission medications   Medication Sig Start Date End Date Taking? Authorizing Provider  darbepoetin (ARANESP, ALBUMIN FREE,) 300 MCG/0.6ML SOLN injection Inject 300 mcg into the skin every 30 (thirty) days.   Yes Historical Provider, MD  DILT-XR 120 MG 24 hr capsule Take 120 mg by mouth daily.  10/18/11  Yes Historical Provider, MD  FeFum-FePoly-FA-B Cmp-C-Biot (INTEGRA PLUS PO) Take 1 tablet by mouth daily.     Yes Historical Provider, MD  furosemide (LASIX) 20 MG tablet Take 20 mg by mouth daily as needed for fluid.   Yes Historical  Provider, MD  hydrALAZINE (APRESOLINE) 25 MG tablet Take 1 tablet (25 mg total) by mouth 3 (three) times daily. 02/12/13  Yes Tarri Fuller, PA-C  Olmesartan-Amlodipine-HCTZ (TRIBENZOR PO) Take 40 mg by mouth daily.   Yes Historical Provider, MD  trolamine salicylate (ASPERCREME) 10 % cream Apply 1 application topically as needed for muscle pain.   Yes Historical Provider, MD  traMADol (ULTRAM) 50 MG tablet Take 1 tablet (50 mg total) by mouth every 6 (six) hours as needed for moderate pain. 12/29/13   Luan Moore, MD   BP 137/70  Pulse 94  Temp(Src) 98 F (36.7 C) (Oral)  Resp 16  SpO2 96% Physical Exam  Constitutional: She is oriented to person, place, and time.  Thin elderly African American woman in no acute distress.  Cardiovascular: Intact distal pulses.    Irregularly irregular rhythm with 3/6 systolic murmur. No rubs or gallops.  Pulmonary/Chest: Effort normal and breath sounds normal. She has no wheezes. She has no rales.  Abdominal: She exhibits no distension. There is no tenderness. There is no rebound.  Musculoskeletal: Normal range of motion. She exhibits no edema and no tenderness.  Neurological: She is alert and oriented to person, place, and time.  5 out of 5 strength in knee extension, knee flexion, ankle dorsiflexion, ankle plantar flexion. 2+ patellar reflexes.  However, patient did exhibit cramping after some examination.  Skin: Skin is warm. No rash noted. No erythema. No pallor.    ED Course  Procedures (including critical care time) Labs Review Labs Reviewed  CBC - Abnormal; Notable for the following:    HCT 34.1 (*)    All other components within normal limits  BASIC METABOLIC PANEL - Abnormal; Notable for the following:    BUN 43 (*)    Creatinine, Ser 1.95 (*)    Calcium 11.2 (*)    GFR calc non Af Amer 22 (*)    GFR calc Af Amer 26 (*)    All other components within normal limits  CK    Imaging Review No results found.   EKG Interpretation None      MDM   Final diagnoses:  Musculoskeletal pain    Recent onset history of bilateral leg pain most severe in bilateral thighs which has improved in severity since onset yesterday. Although patient has several risk factors for peripheral artery disease, somewhat sudden onset in bilateral thighs and benign exam of extremities suggest against. No evidence of anemia, electrolyte abnormalities, and stable renal insufficiency.  - Tramadol 50 mg every 6 hours as needed for pain, considering renal insufficiency.  - Slight hypercalcemia, patient to followup with primary care provider.  Luan Moore, MD 12/29/13 [phone]

## 2013-12-29 NOTE — ED Notes (Signed)
Pt states sometimes her legs and feet swell up and she takes lasix when she needs it. Last dose of lasix was yesterday. No obvious swelling to bilateral legs, feet or arms. Pt denies any pain or trouble walking and states "they aren't swollen today."

## 2013-12-29 NOTE — ED Notes (Signed)
Pt reports bilateral leg pain starting yesterday, denies injury. Pt sts swelling in feet is baseline and she takes lasix for it

## 2013-12-29 NOTE — Discharge Instructions (Signed)
We have prescribed tramadol 50 mg to take every 6 hours as needed for moderate pain. If symptoms persist, please followup with your primary care provider for prescription refills or further workup.  All of your labs today were not concerning for any acute illness. However, your calcium level was slightly elevated. We would advise avoiding calcium supplements and followed up with your primary care provider about this result.

## 2013-12-31 ENCOUNTER — Ambulatory Visit: Payer: Medicare Other

## 2013-12-31 ENCOUNTER — Telehealth: Payer: Self-pay | Admitting: Internal Medicine

## 2013-12-31 ENCOUNTER — Other Ambulatory Visit (HOSPITAL_BASED_OUTPATIENT_CLINIC_OR_DEPARTMENT_OTHER): Payer: Medicare Other

## 2013-12-31 ENCOUNTER — Encounter: Payer: Self-pay | Admitting: Physician Assistant

## 2013-12-31 ENCOUNTER — Ambulatory Visit (HOSPITAL_BASED_OUTPATIENT_CLINIC_OR_DEPARTMENT_OTHER): Payer: Medicare Other | Admitting: Physician Assistant

## 2013-12-31 VITALS — BP 129/57 | HR 50 | Temp 97.6°F | Resp 18 | Ht 62.0 in | Wt 122.1 lb

## 2013-12-31 DIAGNOSIS — D649 Anemia, unspecified: Secondary | ICD-10-CM

## 2013-12-31 DIAGNOSIS — N289 Disorder of kidney and ureter, unspecified: Secondary | ICD-10-CM

## 2013-12-31 DIAGNOSIS — D509 Iron deficiency anemia, unspecified: Secondary | ICD-10-CM

## 2013-12-31 LAB — CBC WITH DIFFERENTIAL/PLATELET
BASO%: 0.3 % (ref 0.0–2.0)
Basophils Absolute: 0 10*3/uL (ref 0.0–0.1)
EOS ABS: 0.1 10*3/uL (ref 0.0–0.5)
EOS%: 1.3 % (ref 0.0–7.0)
HEMATOCRIT: 34.6 % — AB (ref 34.8–46.6)
HGB: 11.2 g/dL — ABNORMAL LOW (ref 11.6–15.9)
LYMPH%: 27.5 % (ref 14.0–49.7)
MCH: 27.8 pg (ref 25.1–34.0)
MCHC: 32.3 g/dL (ref 31.5–36.0)
MCV: 86 fL (ref 79.5–101.0)
MONO#: 0.6 10*3/uL (ref 0.1–0.9)
MONO%: 11.7 % (ref 0.0–14.0)
NEUT%: 59.2 % (ref 38.4–76.8)
NEUTROS ABS: 3.3 10*3/uL (ref 1.5–6.5)
PLATELETS: 157 10*3/uL (ref 145–400)
RBC: 4.02 10*6/uL (ref 3.70–5.45)
RDW: 14.8 % — ABNORMAL HIGH (ref 11.2–14.5)
WBC: 5.5 10*3/uL (ref 3.9–10.3)
lymph#: 1.5 10*3/uL (ref 0.9–3.3)

## 2013-12-31 LAB — FERRITIN CHCC: Ferritin: 851 ng/ml — ABNORMAL HIGH (ref 9–269)

## 2013-12-31 LAB — IRON AND TIBC CHCC
%SAT: 37 % (ref 21–57)
Iron: 97 ug/dL (ref 41–142)
TIBC: 265 ug/dL (ref 236–444)
UIBC: 168 ug/dL (ref 120–384)

## 2013-12-31 NOTE — Telephone Encounter (Signed)
gv and printed appt sched and avs for pt for Aug thru [facility]

## 2013-12-31 NOTE — Progress Notes (Signed)
Hgb: 11.2. Patient does not need injection per Burnetta Sabin, PA.

## 2013-12-31 NOTE — Progress Notes (Addendum)
[place] [phone]   [phone]  OFFICE PROGRESS NOTE  [name], MD [address] [place] [address]  DIAGNOSIS: Anemia of chronic disease plus/minus iron deficiency   CURRENT THERAPY: Aranesp 300 mcg subcutaneously every 4 weeks in addition to Integra Plus,1 capsule by mouth daily  INTERVAL HISTORY: Pam [name] 80 y.o. female returns to the clinic today for three-month followup visit. The patient is feeling fine today with no specific complaints. She denied having any significant fatigue or weakness. She denied having any dizzy spells. The patient is tolerating her treatment with Aranesp and Integra plus fairly well. She had repeat CBC performed earlier today and she is here for evaluation and discussion of her lab results.   MEDICAL HISTORY: Past Medical History  Diagnosis Date  . Anemia   . Personal history of colonic polyps 05/01/2001    hyperplastic  . Hypoparathyroidism   . Diverticulosis   . Vitamin D deficiency   . Spinal stenosis   . Osteoporosis   . Chronic kidney disease   . Atrial fibrillation   . Hypercholesterolemia   . Hypertension   . GERD (gastroesophageal reflux disease)   . CAD (coronary artery disease)   . Carotid bruit     bilateral  . Ankle edema     ALLERGIES:  has No Known Allergies.  MEDICATIONS:  Current Outpatient Prescriptions  Medication Sig Dispense Refill  . FeFum-FePoly-FA-B Cmp-C-Biot (INTEGRA PLUS PO) Take 1 tablet by mouth daily.        . furosemide (LASIX) 20 MG tablet Take 20 mg by mouth daily as needed for fluid.      . hydrALAZINE (APRESOLINE) 25 MG tablet Take 1 tablet (25 mg total) by mouth 3 (three) times daily.  270 tablet  3  . Olmesartan-Amlodipine-HCTZ (TRIBENZOR PO) Take 40 mg by mouth daily.      . traMADol (ULTRAM) 50 MG tablet Take 1 tablet (50 mg total) by mouth every 6 (six) hours as needed for moderate pain.  20 tablet  0  . trolamine salicylate (ASPERCREME) 10 %  cream Apply 1 application topically as needed for muscle pain.      . darbepoetin (ARANESP, ALBUMIN FREE,) 300 MCG/0.6ML SOLN injection Inject 300 mcg into the skin every 30 (thirty) days.      Marland Kitchen DILT-XR 120 MG 24 hr capsule Take 120 mg by mouth daily.        No current facility-administered medications for this visit.    SURGICAL HISTORY:  Past Surgical History  Procedure Laterality Date  . S/p removal of bilateral kidney stones    . Abdominal hysterectomy    . Colonoscopy    . Polypectomy    . Cardioversion  01/04/2003  . Transthoracic echocardiogram  03/2004    EF normal; RA mod-severely dilated, LA mod dilated; mild MR, mild TR; aortic valve mildly sclerotic; mild pulm valve regurg  . Carotid duplex  01/10/2012    less than 50% bilat disease    REVIEW OF SYSTEMS:  A comprehensive review of systems was negative.   PHYSICAL EXAMINATION: General appearance: alert, cooperative and no distress Head: Normocephalic, without obvious abnormality, atraumatic Neck: no adenopathy Lymph nodes: Cervical, supraclavicular, and axillary nodes normal. Resp: clear to auscultation bilaterally Cardio: regular rate and rhythm, S1, S2 normal, no murmur, click, rub or gallop GI: soft, non-tender; bowel sounds normal; no masses,  no organomegaly Extremities: extremities normal, atraumatic, no cyanosis or edema  ECOG PERFORMANCE STATUS: 1 -  Symptomatic but completely ambulatory  Blood pressure 129/57, pulse 50, temperature 97.6 F (36.4 C), temperature source Oral, resp. rate 18, height 5\' 2"  (1.575 m), weight 122 lb 1.6 oz (55.384 kg), SpO2 99.00%.  LABORATORY DATA: Lab Results  Component Value Date   WBC 5.5 12/31/2013   HGB 11.2* 12/31/2013   HCT 34.6* 12/31/2013   MCV 86.0 12/31/2013   PLT 157 12/31/2013      Chemistry      Component Value Date/Time   NA 137 12/29/2013 1313   NA 140 11/06/2012 1110   K 5.1 12/29/2013 1313   K 4.4 11/06/2012 1110   CL 102 12/29/2013 1313   CL 108* 11/06/2012 1110   CO2  22 12/29/2013 1313   CO2 23 11/06/2012 1110   BUN 43* 12/29/2013 1313   BUN 33.7* 11/06/2012 1110   CREATININE 1.95* 12/29/2013 1313   CREATININE 1.8* 11/06/2012 1110      Component Value Date/Time   CALCIUM 11.2* 12/29/2013 1313   CALCIUM 10.8* 11/06/2012 1110   ALKPHOS 83 11/06/2012 1110   ALKPHOS 79 02/07/2012 0906   AST 18 11/06/2012 1110   AST 20 02/07/2012 0906   ALT 12 11/06/2012 1110   ALT 13 02/07/2012 0906   BILITOT 1.64* 11/06/2012 1110   BILITOT 0.9 02/07/2012 0906     Iron studies from 12/03/2013 revealed a ferritin of 562, serum iron of 95, % saturation 35%  RADIOGRAPHIC STUDIES: No results found.  ASSESSMENT AND PLAN: This is a very pleasant 80 years old African American female with anemia of chronic disease secondary to renal insufficiency in addition to iron deficiency anemia. The patient is currently on treatment with Aranesp and Integra plus and tolerating it fairly well. Her CBC today is unremarkable for anemia. Patient was discussed with and also seen by Dr. Julien Nordmann. She will continue on her current treatment with Aranesp monthly if her hemoglobin and hematocrit are within treatable range and on Integra +1 capsule daily. She will followup in 3 months with repeat iron studies drawn one week prior to the visit so that the results will be available for review.  She was advised to call immediately if she has any concerning symptoms in the interval.  The patient voices understanding of current disease status and treatment options and is in agreement with the current care plan.  All questions were answered. The patient knows to call the clinic with any problems, questions or concerns. We can certainly see the patient much sooner if necessary.  Carlton Adam PA-C  ADDENDUM: Hematology/Oncology Attending: I had a face to face encounter with the patient. I recommended her care plan. This is a very pleasant 80 years old African American female with anemia of chronic disease secondary to renal  insufficiency as well as iron deficiency anemia. The patient is feeling fine today with no specific complaints and her CBC is unremarkable except for mild anemia. I recommended for her to continue her treatment with Integra plus as well as Aranesp. She would come back for followup visit in 3 months with repeat CBC and iron study. She was advised to call immediately if she has any concerning symptoms.  Disclaimer: This note was dictated with voice recognition software. Similar sounding words can inadvertently be transcribed and may not be corrected upon review. Eilleen Kempf., MD 01/03/2014

## 2014-01-02 NOTE — Patient Instructions (Signed)
Continue Integra Plus,1 capsule by mouth daily and monthly Aranesp injections as scheduled Followup in 3 months

## 2014-01-28 ENCOUNTER — Ambulatory Visit (HOSPITAL_BASED_OUTPATIENT_CLINIC_OR_DEPARTMENT_OTHER): Payer: Medicare Other

## 2014-01-28 ENCOUNTER — Other Ambulatory Visit (HOSPITAL_BASED_OUTPATIENT_CLINIC_OR_DEPARTMENT_OTHER): Payer: Medicare Other

## 2014-01-28 VITALS — BP 108/47 | HR 76 | Temp 98.2°F

## 2014-01-28 DIAGNOSIS — D631 Anemia in chronic kidney disease: Secondary | ICD-10-CM

## 2014-01-28 DIAGNOSIS — N289 Disorder of kidney and ureter, unspecified: Secondary | ICD-10-CM

## 2014-01-28 DIAGNOSIS — N189 Chronic kidney disease, unspecified: Secondary | ICD-10-CM

## 2014-01-28 DIAGNOSIS — D649 Anemia, unspecified: Secondary | ICD-10-CM

## 2014-01-28 DIAGNOSIS — N039 Chronic nephritic syndrome with unspecified morphologic changes: Secondary | ICD-10-CM

## 2014-01-28 LAB — CBC WITH DIFFERENTIAL/PLATELET
BASO%: 1 % (ref 0.0–2.0)
BASOS ABS: 0.1 10*3/uL (ref 0.0–0.1)
EOS ABS: 0.1 10*3/uL (ref 0.0–0.5)
EOS%: 1 % (ref 0.0–7.0)
HCT: 28.9 % — ABNORMAL LOW (ref 34.8–46.6)
HEMOGLOBIN: 10 g/dL — AB (ref 11.6–15.9)
LYMPH%: 25.5 % (ref 14.0–49.7)
MCH: 28.2 pg (ref 25.1–34.0)
MCHC: 34.6 g/dL (ref 31.5–36.0)
MCV: 81.4 fL (ref 79.5–101.0)
MONO#: 1 10*3/uL — ABNORMAL HIGH (ref 0.1–0.9)
MONO%: 12.4 % (ref 0.0–14.0)
NEUT%: 60.1 % (ref 38.4–76.8)
NEUTROS ABS: 4.6 10*3/uL (ref 1.5–6.5)
Platelets: 164 10*3/uL (ref 145–400)
RBC: 3.55 10*6/uL — ABNORMAL LOW (ref 3.70–5.45)
RDW: 14.4 % (ref 11.2–14.5)
WBC: 7.6 10*3/uL (ref 3.9–10.3)
lymph#: 2 10*3/uL (ref 0.9–3.3)
nRBC: 0 % (ref 0–0)

## 2014-01-28 MED ORDER — DARBEPOETIN ALFA-POLYSORBATE 300 MCG/0.6ML IJ SOLN
300.0000 ug | Freq: Once | INTRAMUSCULAR | Status: AC
  Administered 2014-01-28: 300 ug via SUBCUTANEOUS
  Filled 2014-01-28: qty 0.6

## 2014-01-30 ENCOUNTER — Other Ambulatory Visit: Payer: Self-pay | Admitting: Orthopedic Surgery

## 2014-01-30 DIAGNOSIS — M79604 Pain in right leg: Secondary | ICD-10-CM

## 2014-01-30 DIAGNOSIS — M79605 Pain in left leg: Principal | ICD-10-CM

## 2014-01-30 DIAGNOSIS — R0989 Other specified symptoms and signs involving the circulatory and respiratory systems: Secondary | ICD-10-CM

## 2014-02-08 ENCOUNTER — Ambulatory Visit
Admission: RE | Admit: 2014-02-08 | Discharge: 2014-02-08 | Disposition: A | Discharge status: Home / Self Care | Payer: Medicare Other | Source: Ambulatory Visit | Attending: Orthopedic Surgery | Admitting: Orthopedic Surgery

## 2014-02-08 DIAGNOSIS — M79604 Pain in right leg: Secondary | ICD-10-CM

## 2014-02-08 DIAGNOSIS — R0989 Other specified symptoms and signs involving the circulatory and respiratory systems: Secondary | ICD-10-CM

## 2014-02-08 DIAGNOSIS — M79605 Pain in left leg: Principal | ICD-10-CM

## 2014-02-15 ENCOUNTER — Other Ambulatory Visit: Payer: Self-pay | Admitting: Physician Assistant

## 2014-02-15 NOTE — Telephone Encounter (Signed)
Rx was sent to pharmacy electronically. 

## 2014-02-18 ENCOUNTER — Other Ambulatory Visit: Payer: Self-pay | Admitting: *Deleted

## 2014-02-18 DIAGNOSIS — I70219 Atherosclerosis of native arteries of extremities with intermittent claudication, unspecified extremity: Secondary | ICD-10-CM

## 2014-02-25 ENCOUNTER — Other Ambulatory Visit (HOSPITAL_BASED_OUTPATIENT_CLINIC_OR_DEPARTMENT_OTHER): Payer: Medicare Other

## 2014-02-25 ENCOUNTER — Ambulatory Visit: Payer: Medicare Other

## 2014-02-25 DIAGNOSIS — D649 Anemia, unspecified: Secondary | ICD-10-CM

## 2014-02-25 DIAGNOSIS — N289 Disorder of kidney and ureter, unspecified: Secondary | ICD-10-CM

## 2014-02-25 DIAGNOSIS — N189 Chronic kidney disease, unspecified: Secondary | ICD-10-CM

## 2014-02-25 DIAGNOSIS — D631 Anemia in chronic kidney disease: Secondary | ICD-10-CM

## 2014-02-25 DIAGNOSIS — N039 Chronic nephritic syndrome with unspecified morphologic changes: Secondary | ICD-10-CM

## 2014-02-25 DIAGNOSIS — D509 Iron deficiency anemia, unspecified: Secondary | ICD-10-CM

## 2014-02-25 LAB — CBC WITH DIFFERENTIAL/PLATELET
BASO%: 1.1 % (ref 0.0–2.0)
Basophils Absolute: 0.1 10*3/uL (ref 0.0–0.1)
EOS%: 0.9 % (ref 0.0–7.0)
Eosinophils Absolute: 0.1 10*3/uL (ref 0.0–0.5)
HCT: 38.9 % (ref 34.8–46.6)
HGB: 13.2 g/dL (ref 11.6–15.9)
LYMPH%: 26.5 % (ref 14.0–49.7)
MCH: 28.9 pg (ref 25.1–34.0)
MCHC: 33.9 g/dL (ref 31.5–36.0)
MCV: 85.3 fL (ref 79.5–101.0)
MONO#: 0.8 10*3/uL (ref 0.1–0.9)
MONO%: 14.7 % — ABNORMAL HIGH (ref 0.0–14.0)
NEUT%: 56.8 % (ref 38.4–76.8)
NEUTROS ABS: 3.1 10*3/uL (ref 1.5–6.5)
PLATELETS: 178 10*3/uL (ref 145–400)
RBC: 4.56 10*6/uL (ref 3.70–5.45)
RDW: 15.1 % — ABNORMAL HIGH (ref 11.2–14.5)
WBC: 5.5 10*3/uL (ref 3.9–10.3)
lymph#: 1.5 10*3/uL (ref 0.9–3.3)
nRBC: 0 % (ref 0–0)

## 2014-02-25 MED ORDER — DARBEPOETIN ALFA-POLYSORBATE 300 MCG/0.6ML IJ SOLN: 300.0000 ug | Freq: Once | INTRAMUSCULAR | Status: DC

## 2014-02-27 ENCOUNTER — Encounter: Payer: Self-pay | Admitting: Vascular Surgery

## 2014-02-28 ENCOUNTER — Other Ambulatory Visit (HOSPITAL_COMMUNITY): Payer: Medicare Other

## 2014-02-28 ENCOUNTER — Encounter: Payer: Medicare Other | Admitting: Vascular Surgery

## 2014-03-08 ENCOUNTER — Other Ambulatory Visit: Payer: Medicare Other

## 2014-03-19 ENCOUNTER — Encounter: Payer: Self-pay | Admitting: Vascular Surgery

## 2014-03-20 ENCOUNTER — Ambulatory Visit (HOSPITAL_COMMUNITY)
Admission: RE | Admit: 2014-03-20 | Discharge: 2014-03-20 | Disposition: A | Discharge status: Home / Self Care | Payer: Medicare Other | Source: Ambulatory Visit | Attending: Vascular Surgery | Admitting: Vascular Surgery

## 2014-03-20 ENCOUNTER — Ambulatory Visit (INDEPENDENT_AMBULATORY_CARE_PROVIDER_SITE_OTHER): Payer: Medicare Other | Admitting: Vascular Surgery

## 2014-03-20 ENCOUNTER — Encounter: Payer: Self-pay | Admitting: Vascular Surgery

## 2014-03-20 VITALS — BP 130/71 | HR 84 | Ht 62.0 in | Wt 122.9 lb

## 2014-03-20 DIAGNOSIS — I70219 Atherosclerosis of native arteries of extremities with intermittent claudication, unspecified extremity: Secondary | ICD-10-CM

## 2014-03-20 DIAGNOSIS — I739 Peripheral vascular disease, unspecified: Secondary | ICD-10-CM

## 2014-03-20 DIAGNOSIS — F17210 Nicotine dependence, cigarettes, uncomplicated: Secondary | ICD-10-CM

## 2014-03-20 DIAGNOSIS — I714 Abdominal aortic aneurysm, without rupture, unspecified: Secondary | ICD-10-CM

## 2014-03-20 NOTE — Progress Notes (Signed)
[facility] HISTORY AND PHYSICAL   History of Present Illness:  Patient is a 80 y.o. year old female who presents for evaluation of bilateral lower extremity pain. The patient develops pain in the back of both legs especially calf after walking up 2 or 3 was in a grocery store. She denies rest pain. She has no history of nonhealing ulcers on the feet. She does currently smoke 2-3 cigarettes per day. She did not really seem like she was interested in quitting. However, I did counsel her and greater than 3 minutes smoking cessation. She previously had ABIs performed in 2010 which are 0.45 on the right 0.65 on the left. She recently had additional ABIs performed today [facility] which were 0.3 on the right 0.6 on the left. She also previously had an aortic duplex in 2010 [facility] which showed aortic ectasia and a diameter of 2.5 cm.  She does have chronic back pain and a history of spinal stenosis. Other medical problems include atrial fibrillation, chronic kidney disease, hypertension, coronary artery disease of which are currently stable. Previous carotid duplex showed less than 50% stenosis bilaterally in 2013.  Past Medical History  Diagnosis Date  . Anemia   . Personal history of colonic polyps 05/01/2001    hyperplastic  . Hypoparathyroidism   . Diverticulosis   . Vitamin D deficiency   . Spinal stenosis   . Osteoporosis   . Chronic kidney disease   . Atrial fibrillation   . Hypercholesterolemia   . Hypertension   . GERD (gastroesophageal reflux disease)   . CAD (coronary artery disease)   . Carotid bruit     bilateral  . Ankle edema     Past Surgical History  Procedure Laterality Date  . S/p removal of bilateral kidney stones    . Abdominal hysterectomy    . Colonoscopy    . Polypectomy    . Cardioversion  01/04/2003  . Transthoracic echocardiogram  03/2004    EF normal; RA mod-severely dilated, LA mod dilated; mild MR, mild TR;  aortic valve mildly sclerotic; mild pulm valve regurg  . Carotid duplex  01/10/2012    less than 50% bilat disease    Social History History  Substance Use Topics  . Smoking status: Current Some Day Smoker -- 0.25 packs/day    Types: Cigarettes  . Smokeless tobacco: Never Used     Comment: smokes 2-3 cigs daily  . Alcohol Use: No     Comment: occ    Family History Family History  Problem Relation Age of Onset  . Heart disease Mother   . Heart disease Father   . Colon cancer Neg Hx     Allergies  No Known Allergies   Current Outpatient Prescriptions  Medication Sig Dispense Refill  . darbepoetin (ARANESP, ALBUMIN FREE,) 300 MCG/0.6ML SOLN injection Inject 300 mcg into the skin every 30 (thirty) days.      Marland Kitchen DILT-XR 120 MG 24 hr capsule Take 120 mg by mouth daily.       Marland Kitchen FeFum-FePoly-FA-B Cmp-C-Biot (INTEGRA PLUS PO) Take 1 tablet by mouth daily.        . furosemide (LASIX) 20 MG tablet Take 20 mg by mouth daily as needed for fluid.      . hydrALAZINE (APRESOLINE) 25 MG tablet Take 1 tablet (25 mg total) by mouth 3 (three) times daily. <PLEASE MAKE APPOINTMENT FOR REFILLS>  270 tablet  0  . Olmesartan-Amlodipine-HCTZ (TRIBENZOR PO) Take 40 mg  by mouth daily.      . traMADol (ULTRAM) 50 MG tablet Take 1 tablet (50 mg total) by mouth every 6 (six) hours as needed for moderate pain.  20 tablet  0  . trolamine salicylate (ASPERCREME) 10 % cream Apply 1 application topically as needed for muscle pain.       No current facility-administered medications for this visit.    ROS:   General:  No weight loss, Fever, chills  HEENT: No recent headaches, no nasal bleeding, no visual changes, no sore throat  Neurologic: No dizziness, blackouts, seizures. No recent symptoms of stroke or mini- stroke. No recent episodes of slurred speech, or temporary blindness.  Cardiac: No recent episodes of chest pain/pressure, no shortness of breath at rest.  No shortness of breath with exertion.   Denies history of atrial fibrillation or irregular heartbeat  Vascular: No history of rest pain in feet.  + history of claudication.  No history of non-healing ulcer, No history of DVT   Pulmonary: No home oxygen, no productive cough, no hemoptysis,  No asthma or wheezing  Musculoskeletal:  [ x] Arthritis, [ ]  Low back pain,  [ ]  Joint pain  Hematologic:No history of hypercoagulable state.  No history of easy bleeding.  No history of anemia  Gastrointestinal: No hematochezia or melena,  No gastroesophageal reflux, no trouble swallowing  Urinary: [x chronic Kidney disease, [ ]  on HD - [ ]  MWF or [ ]  TTHS, [ ]  Burning with urination, [ ]  Frequent urination, [ ]  Difficulty urinating;   Skin: No rashes  Psychological: No history of anxiety,  No history of depression   Physical Examination  Filed Vitals:   03/20/14 1432  BP: 130/71  Pulse: 84  Height: 5\' 2"  (0000000 m)  Weight: 122 lb 14.4 oz (55.747 kg)  SpO2: 100%    Body mass index is 22.47 kg/(m^2).  General:  Alert and oriented, no acute distress HEENT: Normal Neck: No bruit or JVD Pulmonary: Clear to auscultation bilaterally Cardiac: Regular Rate and Rhythm with 3/6 systolic murmur Abdomen: Soft, non-tender, non-distended, no mass, no scars, aortic pulsation present slightly enlarged at 3 cm on palpation Skin: No rash Extremity Pulses:  2+ radial, brachial, absent femoral, dorsalis pedis, posterior tibial pulses bilaterally Musculoskeletal: No deformity or edema  Neurologic: Upper and lower extremity motor 5/5 and symmetric  DATA:  Recent pulse volume recordings reviewed which showed monophasic Doppler flow from the groin down.  She'll lower extremity arterial duplex in our office today which I reviewed and interpreted. This showed again monophasic flow suggestive of proximal inflow disease. She also had occlusion of her right superficial femoral artery and possibly the left superficial femoral artery   ASSESSMENT:   Aortoiliac and superficial femoral artery occlusive disease. Tobacco abuse. The patient has mildly limiting claudication symptoms. She also has a history of an ectatic aorta in her aorta is slightly dilated on my exam today.   PLAN:  The patient needs an aortic ultrasound to rule out aortic aneurysm. I offered her arteriogram for further evaluation of her arterial occlusive disease today. However she wishes to defer this until after Thanksgiving. We have scheduled her for an aortic ultrasound as well as further followup visit with me in November to further discuss possible arteriogram at that time.  [name], MD [facility] Office: [phone] Pager: [phone]

## 2014-03-20 NOTE — Addendum Note (Signed)
Addended by: Mena Goes on: 03/20/2014 04:39 PM   Modules accepted: Orders

## 2014-03-21 ENCOUNTER — Telehealth: Payer: Self-pay | Admitting: Internal Medicine

## 2014-03-21 NOTE — Addendum Note (Signed)
Addended by: Mena Goes on: 03/21/2014 04:10 PM   Modules accepted: Orders

## 2014-03-21 NOTE — Telephone Encounter (Signed)
returned pt call and lvm for pt to call back to confirm cx appt and if needs to r/s

## 2014-03-25 ENCOUNTER — Other Ambulatory Visit: Payer: Medicare Other

## 2014-03-25 ENCOUNTER — Ambulatory Visit: Payer: Medicare Other

## 2014-04-10 ENCOUNTER — Other Ambulatory Visit (HOSPITAL_COMMUNITY): Payer: Medicare Other

## 2014-04-10 ENCOUNTER — Encounter (HOSPITAL_COMMUNITY): Payer: Medicare Other

## 2014-04-10 ENCOUNTER — Ambulatory Visit (HOSPITAL_COMMUNITY)
Admission: RE | Admit: 2014-04-10 | Discharge: 2014-04-10 | Disposition: A | Discharge status: Home / Self Care | Payer: Medicare Other | Source: Ambulatory Visit | Attending: Vascular Surgery | Admitting: Vascular Surgery

## 2014-04-10 DIAGNOSIS — I739 Peripheral vascular disease, unspecified: Secondary | ICD-10-CM | POA: Diagnosis not present

## 2014-04-10 DIAGNOSIS — I714 Abdominal aortic aneurysm, without rupture, unspecified: Secondary | ICD-10-CM

## 2014-04-17 ENCOUNTER — Encounter: Payer: Self-pay | Admitting: Vascular Surgery

## 2014-04-18 ENCOUNTER — Other Ambulatory Visit: Payer: Self-pay

## 2014-04-18 ENCOUNTER — Ambulatory Visit (INDEPENDENT_AMBULATORY_CARE_PROVIDER_SITE_OTHER): Payer: Medicare Other | Admitting: Vascular Surgery

## 2014-04-18 ENCOUNTER — Encounter: Payer: Self-pay | Admitting: Vascular Surgery

## 2014-04-18 VITALS — BP 157/60 | HR 74 | Temp 98.5°F | Resp 16 | Ht 62.5 in | Wt 123.0 lb

## 2014-04-18 DIAGNOSIS — I714 Abdominal aortic aneurysm, without rupture, unspecified: Secondary | ICD-10-CM

## 2014-04-18 DIAGNOSIS — I739 Peripheral vascular disease, unspecified: Secondary | ICD-10-CM

## 2014-04-18 NOTE — Patient Instructions (Signed)
Please review the tobacco cessation information given to you today. It lists many hints that are useful in your effort to stop smoking. The [facility] Tobacco [facility]essation contact phone # is [phone] These nurses and advisors offer lots of FREE information and aids to help you quit.    The [place] Quit Smoking line #  [phone], they will also assist you with programs designed to help you stop smoking.    

## 2014-04-18 NOTE — Progress Notes (Signed)
[facility] [place] HISTORY AND PHYSICAL    History of Present Illness:  Patient is a 80 y.o. year old female who presents follow-up evaluation of bilateral lower extremity pain. The patient develops pain in the back of both legs especially calf after walking up 2 or 3 aisles in a grocery store. She denies rest pain. She has no history of nonhealing ulcers on the feet. She does currently smoke 2-3 cigarettes per day. She previously had ABIs performed in 2010 which are 0.45 on the right 0.65 on the left. She recently had additional ABIs performed today [facility] which were 0.3 on the right 0.6 on the left. She had an aorto iliac duplex last week due to prior history of ectatic aorta. This showed a normal diameter aorta but also showed a short segment left common iliac occlusion with monophasic flow in the left external iliac. She also had a stenosis of the right common iliac artery.   She does have chronic back pain and a history of spinal stenosis. Other medical problems include atrial fibrillation, chronic kidney disease, hypertension, coronary artery disease of which are currently stable. Previous carotid duplex showed less than 50% stenosis bilaterally in 2013. At her previous office visit she had deferred arteriogram. She wishes to have her arteriogram after Thanksgiving.    Past Medical History   Diagnosis  Date   .  Anemia     .  Personal history of colonic polyps  05/01/2001       hyperplastic   .  Hypoparathyroidism     .  Diverticulosis     .  Vitamin D deficiency     .  Spinal stenosis     .  Osteoporosis     .  Chronic kidney disease     .  Atrial fibrillation     .  Hypercholesterolemia     .  Hypertension     .  GERD (gastroesophageal reflux disease)     .  CAD (coronary artery disease)     .  Carotid bruit         bilateral   .  Ankle edema         Past Surgical History   Procedure  Laterality  Date   .  S/p removal of bilateral kidney stones        .  Abdominal hysterectomy       .  Colonoscopy       .  Polypectomy       .  Cardioversion    01/04/2003   .  Transthoracic echocardiogram    03/2004       EF normal; RA mod-severely dilated, LA mod dilated; mild MR, mild TR; aortic valve mildly sclerotic; mild pulm valve regurg   .  Carotid duplex    01/10/2012       less than 50% bilat disease     Social History History   Substance Use Topics   .  Smoking status:  Current Some Day Smoker -- 0.25 packs/day       Types:  Cigarettes   .  Smokeless tobacco:  Never Used         Comment: smokes 2-3 cigs daily   .  Alcohol Use:  No         Comment: occ     Family History Family History   Problem  Relation  Age of Onset   .  Heart disease  Mother     .  Heart disease  Father     .  Colon cancer  Neg Hx       Allergies  No Known Allergies     Current Outpatient Prescriptions   Medication  Sig  Dispense  Refill   .  darbepoetin (ARANESP, ALBUMIN FREE,) 300 MCG/0.6ML SOLN injection  Inject 300 mcg into the skin every 30 (thirty) days.         Marland Kitchen  DILT-XR 120 MG 24 hr capsule  Take 120 mg by mouth daily.          Marland Kitchen  FeFum-FePoly-FA-B Cmp-C-Biot (INTEGRA PLUS PO)  Take 1 tablet by mouth daily.           .  furosemide (LASIX) 20 MG tablet  Take 20 mg by mouth daily as needed for fluid.         .  hydrALAZINE (APRESOLINE) 25 MG tablet  Take 1 tablet (25 mg total) by mouth 3 (three) times daily. <PLEASE MAKE APPOINTMENT FOR REFILLS>   270 tablet   0   .  Olmesartan-Amlodipine-HCTZ (TRIBENZOR PO)  Take 40 mg by mouth daily.         .  traMADol (ULTRAM) 50 MG tablet  Take 1 tablet (50 mg total) by mouth every 6 (six) hours as needed for moderate pain.   20 tablet   0   .  trolamine salicylate (ASPERCREME) 10 % cream  Apply 1 application topically as needed for muscle pain.            No current facility-administered medications for this visit.     ROS:    General:  No weight loss, Fever, chills  HEENT: No recent headaches, no  nasal bleeding, no visual changes, no sore throat  Neurologic: No dizziness, blackouts, seizures. No recent symptoms of stroke or mini- stroke. No recent episodes of slurred speech, or temporary blindness.  Cardiac: No recent episodes of chest pain/pressure, no shortness of breath at rest.  No shortness of breath with exertion.  Denies history of atrial fibrillation or irregular heartbeat  Vascular: No history of rest pain in feet.  + history of claudication.  No history of non-healing ulcer, No history of DVT    Pulmonary: No home oxygen, no productive cough, no hemoptysis,  No asthma or wheezing  Musculoskeletal:  [ x] Arthritis, [ ]  Low back pain,  [ ]  Joint pain  Hematologic:No history of hypercoagulable state.  No history of easy bleeding.  No history of anemia  Gastrointestinal: No hematochezia or melena,  No gastroesophageal reflux, no trouble swallowing  Urinary: [x chronic Kidney disease, [ ]  on HD - [ ]  MWF or [ ]  TTHS, [ ]  Burning with urination, [ ]  Frequent urination, [ ]  Difficulty urinating;    Skin: No rashes  Psychological: No history of anxiety,  No history of depression   Physical Examination  Filed Vitals:   04/18/14 1121  BP: 157/60  Pulse: 74  Temp: 98.5 F (36.9 C)  TempSrc: Oral  Resp: 16  Height: 5' 2.5" (1.588 m)  Weight: 123 lb (55.792 kg)  SpO2: 98%    General:  Alert and oriented, no acute distress HEENT: Normal Neck: No bruit or JVD Pulmonary: Clear to auscultation bilaterally Cardiac: Regular Rate and Rhythm with 3/6 systolic murmur Abdomen: Soft, non-tender, non-distended, no mass, no scars, aortic pulsation present slightly enlarged at 3 cm on palpation Skin: No rash Extremity Pulses:  2+ radial, brachial, absent femoral, dorsalis pedis, posterior tibial pulses bilaterally  Musculoskeletal: No deformity or edema     Neurologic: Upper and lower extremity motor 5/5 and symmetric  DATA:  Recent pulse volume recordings reviewed which showed  monophasic Doppler flow from the groin down.  She'll lower extremity arterial duplex in our office recently. This showed again monophasic flow suggestive of proximal inflow disease. She also had occlusion of her right superficial femoral artery and possibly the left superficial femoral artery   ASSESSMENT:  Aortoiliac and superficial femoral artery occlusive disease. Tobacco abuse. The patient has mildly limiting claudication symptoms. She also has a history of an ectatic aorta in her aorta is slightly dilated on my exam today.   PLAN:  Aortogram lower extremity runoff possible bilateral iliac intervention scheduled for 05/03/2014. Risks benefits possible palpitations and procedure details were trying to the patient and her family today. These include but are not limited to bleeding infection contrast reaction vessel injury. She understands and agrees to proceed. If we are unable to fix this percutaneously we will have to have additional conversation on whether or not she would want to proceed with an operation. With her age I would be inclined to not do an open surgical operation for claudication symptoms alone but would reserve this only for limb threatening ischemia.  She is not on any anticoagulation with antiplatelet agent or otherwise for PAD or atrial fibrillation. The patient states this was due to a GI bleed in the past. If we do an intervention we will also need to revisit this.  [name], MD [facility] Office: [phone] Pager: [phone]

## 2014-04-22 ENCOUNTER — Ambulatory Visit (HOSPITAL_BASED_OUTPATIENT_CLINIC_OR_DEPARTMENT_OTHER): Payer: Medicare Other

## 2014-04-22 ENCOUNTER — Other Ambulatory Visit (HOSPITAL_BASED_OUTPATIENT_CLINIC_OR_DEPARTMENT_OTHER): Payer: Medicare Other

## 2014-04-22 ENCOUNTER — Encounter: Payer: Self-pay | Admitting: Internal Medicine

## 2014-04-22 ENCOUNTER — Telehealth: Payer: Self-pay | Admitting: Internal Medicine

## 2014-04-22 ENCOUNTER — Ambulatory Visit (HOSPITAL_BASED_OUTPATIENT_CLINIC_OR_DEPARTMENT_OTHER): Payer: Medicare Other | Admitting: Internal Medicine

## 2014-04-22 VITALS — BP 134/64 | HR 92 | Temp 97.7°F | Resp 18 | Ht 62.0 in | Wt 122.9 lb

## 2014-04-22 DIAGNOSIS — D538 Other specified nutritional anemias: Secondary | ICD-10-CM

## 2014-04-22 DIAGNOSIS — D509 Iron deficiency anemia, unspecified: Secondary | ICD-10-CM

## 2014-04-22 DIAGNOSIS — D649 Anemia, unspecified: Secondary | ICD-10-CM

## 2014-04-22 DIAGNOSIS — N289 Disorder of kidney and ureter, unspecified: Secondary | ICD-10-CM

## 2014-04-22 DIAGNOSIS — N189 Chronic kidney disease, unspecified: Secondary | ICD-10-CM

## 2014-04-22 DIAGNOSIS — D638 Anemia in other chronic diseases classified elsewhere: Secondary | ICD-10-CM

## 2014-04-22 DIAGNOSIS — D631 Anemia in chronic kidney disease: Secondary | ICD-10-CM

## 2014-04-22 LAB — CBC WITH DIFFERENTIAL/PLATELET
BASO%: 0.6 % (ref 0.0–2.0)
BASOS ABS: 0 10*3/uL (ref 0.0–0.1)
EOS ABS: 0.1 10*3/uL (ref 0.0–0.5)
EOS%: 1.2 % (ref 0.0–7.0)
HEMATOCRIT: 30.3 % — AB (ref 34.8–46.6)
HGB: 10.3 g/dL — ABNORMAL LOW (ref 11.6–15.9)
LYMPH#: 1.6 10*3/uL (ref 0.9–3.3)
LYMPH%: 22.3 % (ref 14.0–49.7)
MCH: 27.6 pg (ref 25.1–34.0)
MCHC: 34 g/dL (ref 31.5–36.0)
MCV: 81.2 fL (ref 79.5–101.0)
MONO#: 1 10*3/uL — AB (ref 0.1–0.9)
MONO%: 13.1 % (ref 0.0–14.0)
NEUT#: 4.5 10*3/uL (ref 1.5–6.5)
NEUT%: 62.8 % (ref 38.4–76.8)
Platelets: 157 10*3/uL (ref 145–400)
RBC: 3.73 10*6/uL (ref 3.70–5.45)
RDW: 14.1 % (ref 11.2–14.5)
WBC: 7.2 10*3/uL (ref 3.9–10.3)

## 2014-04-22 LAB — IRON AND TIBC CHCC
%SAT: 29 % (ref 21–57)
Iron: 72 ug/dL (ref 41–142)
TIBC: 245 ug/dL (ref 236–444)
UIBC: 173 ug/dL (ref 120–384)

## 2014-04-22 LAB — FERRITIN CHCC: Ferritin: 818 ng/ml — ABNORMAL HIGH (ref 9–269)

## 2014-04-22 MED ORDER — DARBEPOETIN ALFA 300 MCG/0.6ML IJ SOSY
300.0000 ug | PREFILLED_SYRINGE | Freq: Once | INTRAMUSCULAR | Status: AC
  Administered 2014-04-22: 300 ug via SUBCUTANEOUS
  Filled 2014-04-22: qty 0.6

## 2014-04-22 NOTE — Telephone Encounter (Signed)
gv and printed appt sched and avs for pt for DEC thru March 2016

## 2014-04-22 NOTE — Progress Notes (Signed)
[place] [phone]   [phone]  OFFICE PROGRESS NOTE  [name], MD [address] [place] [address]  DIAGNOSIS: Anemia of chronic disease plus/minus iron deficiency   CURRENT THERAPY: Aranesp 300 mcg subcutaneously every 4 weeks in addition to Integra Plus,1 capsule by mouth daily  INTERVAL HISTORY: Pam [name] 80 y.o. female returns to the clinic today for three-month followup visit. The patient is feeling fine today with no specific complaints. She denied having any significant fatigue or weakness. She denied having any dizzy spells. The patient is tolerating her treatment with Aranesp and Integra plus fairly well. She had repeat CBC performed earlier today and she is here for evaluation and discussion of her lab results.   MEDICAL HISTORY: Past Medical History  Diagnosis Date  . Anemia   . Personal history of colonic polyps 05/01/2001    hyperplastic  . Hypoparathyroidism   . Diverticulosis   . Vitamin D deficiency   . Spinal stenosis   . Osteoporosis   . Chronic kidney disease   . Atrial fibrillation   . Hypercholesterolemia   . Hypertension   . GERD (gastroesophageal reflux disease)   . CAD (coronary artery disease)   . Carotid bruit     bilateral  . Ankle edema     ALLERGIES:  has No Known Allergies.  MEDICATIONS:  Current Outpatient Prescriptions  Medication Sig Dispense Refill  . darbepoetin (ARANESP, ALBUMIN FREE,) 300 MCG/0.6ML SOLN injection Inject 300 mcg into the skin every 30 (thirty) days.    Marland Kitchen DILT-XR 120 MG 24 hr capsule Take 120 mg by mouth daily.     Marland Kitchen FeFum-FePoly-FA-B Cmp-C-Biot (INTEGRA PLUS PO) Take 1 tablet by mouth daily.      . furosemide (LASIX) 20 MG tablet Take 20 mg by mouth daily as needed for fluid.    . hydrALAZINE (APRESOLINE) 25 MG tablet Take 1 tablet (25 mg total) by mouth 3 (three) times daily. <PLEASE MAKE APPOINTMENT FOR REFILLS> 270 tablet 0  . Olmesartan-Amlodipine-HCTZ  (TRIBENZOR PO) Take 40 mg by mouth daily.    . traMADol (ULTRAM) 50 MG tablet Take 1 tablet (50 mg total) by mouth every 6 (six) hours as needed for moderate pain. 20 tablet 0  . trolamine salicylate (ASPERCREME) 10 % cream Apply 1 application topically as needed for muscle pain.     No current facility-administered medications for this visit.    SURGICAL HISTORY:  Past Surgical History  Procedure Laterality Date  . S/p removal of bilateral kidney stones    . Abdominal hysterectomy    . Colonoscopy    . Polypectomy    . Cardioversion  01/04/2003  . Transthoracic echocardiogram  03/2004    EF normal; RA mod-severely dilated, LA mod dilated; mild MR, mild TR; aortic valve mildly sclerotic; mild pulm valve regurg  . Carotid duplex  01/10/2012    less than 50% bilat disease    REVIEW OF SYSTEMS:  A comprehensive review of systems was negative.   PHYSICAL EXAMINATION: General appearance: alert, cooperative and no distress Head: Normocephalic, without obvious abnormality, atraumatic Neck: no adenopathy Lymph nodes: Cervical, supraclavicular, and axillary nodes normal. Resp: clear to auscultation bilaterally Cardio: regular rate and rhythm, S1, S2 normal, no murmur, click, rub or gallop GI: soft, non-tender; bowel sounds normal; no masses,  no organomegaly Extremities: extremities normal, atraumatic, no cyanosis or edema  ECOG PERFORMANCE STATUS: 1 - Symptomatic but completely ambulatory  Blood pressure 134/64, pulse 92,  temperature 97.7 F (36.5 C), temperature source Oral, resp. rate 18, height 5\' 2"  (1.575 m), weight 122 lb 14.4 oz (55.747 kg), SpO2 99 %.  LABORATORY DATA: Lab Results  Component Value Date   WBC 7.2 04/22/2014   HGB 10.3* 04/22/2014   HCT 30.3* 04/22/2014   MCV 81.2 04/22/2014   PLT 157 04/22/2014      Chemistry      Component Value Date/Time   NA 137 12/29/2013 1313   NA 140 11/06/2012 1110   K 5.1 12/29/2013 1313   K 4.4 11/06/2012 1110   CL 102  12/29/2013 1313   CL 108* 11/06/2012 1110   CO2 22 12/29/2013 1313   CO2 23 11/06/2012 1110   BUN 43* 12/29/2013 1313   BUN 33.7* 11/06/2012 1110   CREATININE 1.95* 12/29/2013 1313   CREATININE 1.8* 11/06/2012 1110      Component Value Date/Time   CALCIUM 11.2* 12/29/2013 1313   CALCIUM 10.8* 11/06/2012 1110   ALKPHOS 83 11/06/2012 1110   ALKPHOS 79 02/07/2012 0906   AST 18 11/06/2012 1110   AST 20 02/07/2012 0906   ALT 12 11/06/2012 1110   ALT 13 02/07/2012 0906   BILITOT 1.64* 11/06/2012 1110   BILITOT 0.9 02/07/2012 0906     Serum ferritin 818, serum iron 72, total iron binding capacity 245 and iron saturation 29%.  RADIOGRAPHIC STUDIES: No results found.  ASSESSMENT AND PLAN: This is a very pleasant 80 years old African American female with anemia of chronic disease secondary to renal insufficiency in addition to iron deficiency anemia. The patient is currently on treatment with Aranesp and Integra plus and tolerating it fairly well. Her CBC today showed mild anemia. The iron study is normal and the ferritin level is elevated. I recommended for the patient to continue her current treatment. She would come back for followup visit in 3 months with repeat CBC, iron study and ferritin. She was advised to call immediately if she has any concerning symptoms in the interval.  The patient voices understanding of current disease status and treatment options and is in agreement with the current care plan.  All questions were answered. The patient knows to call the clinic with any problems, questions or concerns. We can certainly see the patient much sooner if necessary.  Disclaimer: This note was dictated with voice recognition software. Similar sounding words can inadvertently be transcribed and may not be corrected upon review.

## 2014-05-03 ENCOUNTER — Ambulatory Visit (HOSPITAL_COMMUNITY)
Admission: RE | Admit: 2014-05-03 | Discharge: 2014-05-03 | Disposition: A | Discharge status: Home / Self Care | Payer: Medicare Other | Source: Ambulatory Visit | Attending: Vascular Surgery | Admitting: Vascular Surgery

## 2014-05-03 ENCOUNTER — Encounter (HOSPITAL_COMMUNITY)
Admission: RE | Disposition: A | Discharge status: Home / Self Care | Payer: Self-pay | Source: Ambulatory Visit | Attending: Vascular Surgery

## 2014-05-03 ENCOUNTER — Other Ambulatory Visit: Payer: Self-pay

## 2014-05-03 DIAGNOSIS — M810 Age-related osteoporosis without current pathological fracture: Secondary | ICD-10-CM | POA: Diagnosis not present

## 2014-05-03 DIAGNOSIS — K219 Gastro-esophageal reflux disease without esophagitis: Secondary | ICD-10-CM | POA: Diagnosis not present

## 2014-05-03 DIAGNOSIS — F17210 Nicotine dependence, cigarettes, uncomplicated: Secondary | ICD-10-CM | POA: Diagnosis not present

## 2014-05-03 DIAGNOSIS — I4891 Unspecified atrial fibrillation: Secondary | ICD-10-CM | POA: Diagnosis not present

## 2014-05-03 DIAGNOSIS — E780 Pure hypercholesterolemia: Secondary | ICD-10-CM | POA: Diagnosis not present

## 2014-05-03 DIAGNOSIS — I129 Hypertensive chronic kidney disease with stage 1 through stage 4 chronic kidney disease, or unspecified chronic kidney disease: Secondary | ICD-10-CM | POA: Insufficient documentation

## 2014-05-03 DIAGNOSIS — E209 Hypoparathyroidism, unspecified: Secondary | ICD-10-CM | POA: Insufficient documentation

## 2014-05-03 DIAGNOSIS — I739 Peripheral vascular disease, unspecified: Secondary | ICD-10-CM | POA: Insufficient documentation

## 2014-05-03 DIAGNOSIS — I70213 Atherosclerosis of native arteries of extremities with intermittent claudication, bilateral legs: Secondary | ICD-10-CM

## 2014-05-03 DIAGNOSIS — I2510 Atherosclerotic heart disease of native coronary artery without angina pectoris: Secondary | ICD-10-CM | POA: Diagnosis not present

## 2014-05-03 HISTORY — PX: ABDOMINAL AORTAGRAM: SHX5454

## 2014-05-03 SURGERY — ABDOMINAL AORTAGRAM
Anesthesia: LOCAL

## 2014-05-03 MED ORDER — HEPARIN SODIUM (PORCINE) 1000 UNIT/ML IJ SOLN
INTRAMUSCULAR | Status: AC
  Filled 2014-05-03: qty 1

## 2014-05-03 MED ORDER — SODIUM CHLORIDE 0.9 % IV SOLN
INTRAVENOUS | Status: DC
  Administered 2014-05-03: 1000 mL via INTRAVENOUS

## 2014-05-03 MED ORDER — LIDOCAINE HCL (PF) 1 % IJ SOLN
INTRAMUSCULAR | Status: AC
  Filled 2014-05-03: qty 30

## 2014-05-03 MED ORDER — NITROGLYCERIN 0.2 MG/ML ON CALL CATH LAB
INTRAVENOUS | Status: AC
  Filled 2014-05-03: qty 1

## 2014-05-03 MED ORDER — HEPARIN (PORCINE) IN NACL 2-0.9 UNIT/ML-% IJ SOLN
INTRAMUSCULAR | Status: AC
  Filled 2014-05-03: qty 1000

## 2014-05-03 MED ORDER — SODIUM CHLORIDE 0.45 % IV SOLN
INTRAVENOUS | Status: DC
  Administered 2014-05-03: 75 mL/h via INTRAVENOUS

## 2014-05-03 MED ORDER — SODIUM CHLORIDE 0.9 % IV SOLN: 500.0000 mL | Freq: Once | INTRAVENOUS | Status: DC | PRN

## 2014-05-03 NOTE — Discharge Instructions (Signed)
Angiogram, Care After Refer to this sheet in the next few weeks. These instructions provide you with information on caring for yourself after your procedure. Your health care provider may also give you more specific instructions. Your treatment has been planned according to current medical practices, but problems sometimes occur. Call your health care provider if you have any problems or questions after your procedure.  WHAT TO EXPECT AFTER THE PROCEDURE After your procedure, it is typical to have the following sensations:  Minor discomfort or tenderness and a small bump at the catheter insertion site. The bump should usually decrease in size and tenderness within 1 to 2 weeks.  Any bruising will usually fade within 2 to 4 weeks. HOME CARE INSTRUCTIONS   You may need to keep taking blood thinners if they were prescribed for you. Take medicines only as directed by your health care provider.  Do not apply powder or lotion to the site.  Do not take baths, swim, or use a hot tub until your health care provider approves.  You may shower 24 hours after the procedure. Remove the bandage (dressing) and gently wash the site with plain soap and water. Gently pat the site dry.  Inspect the site at least twice daily.  Limit your activity for the first 48 hours. Do not  lift anything over 20 lb (9 kg) or as directed by your health care provider.  Plan to have someone take you home after the procedure. Follow instructions about when you can drive or return to work. SEEK MEDICAL CARE IF:  You get light-headed when standing up.  You have drainage (other than a small amount of blood on the dressing).  You have chills.  You have a fever.  You have redness, warmth, swelling, or pain at the insertion site. SEEK IMMEDIATE MEDICAL CARE IF:   You develop chest pain or shortness of breath, feel faint, or pass out.  You have bleeding, swelling larger than a walnut, or drainage from the catheter  insertion site.  You develop pain, discoloration, coldness, or severe bruising in the leg or arm that held the catheter.  You develop bleeding from any other place, such as the bowels. You may see bright red blood in your urine or stools, or your stools may appear black and tarry.  You have heavy bleeding from the site. If this happens, hold pressure on the site. MAKE SURE YOU:  Understand these instructions.  Will watch your condition.  Will get help right away if you are not doing well or get worse. Document Released: 12/03/2004 Document Revised: 10/01/2013 Document Reviewed: 10/09/2012 [facility] Patient Information 2015 [place], [place]. This information is not intended to replace advice given to you by your health care provider. Make sure you discuss any questions you have with your health care provider.

## 2014-05-03 NOTE — Interval H&P Note (Signed)
History and Physical Interval Note:  05/03/2014 8:31 AM  Pam [name]  has presented today for surgery, with the diagnosis of pad  The various methods of treatment have been discussed with the patient and family. After consideration of risks, benefits and other options for treatment, the patient has consented to  Procedure(s): ABDOMINAL AORTAGRAM (N/A) as a surgical intervention .  The patient's history has been reviewed, patient examined, no change in status, stable for surgery.  I have reviewed the patient's chart and labs.  Questions were answered to the patient's satisfaction.  Elevated creatinine but near baseline.  Discussed with pt risk of contrast nephropathy.   Pam [name] E

## 2014-05-03 NOTE — Interval H&P Note (Signed)
History and Physical Interval Note:  05/03/2014 8:32 AM  Pam [name]  has presented today for surgery, with the diagnosis of pad  The various methods of treatment have been discussed with the patient and family. After consideration of risks, benefits and other options for treatment, the patient has consented to  Procedure(s): ABDOMINAL AORTAGRAM (N/A) as a surgical intervention .  The patient's history has been reviewed, patient examined, no change in status, stable for surgery.  I have reviewed the patient's chart and labs.  Questions were answered to the patient's satisfaction.     Ranier Coach E

## 2014-05-03 NOTE — Progress Notes (Signed)
Site area: left brachial 5 french arterial sheath removed by Lissa Merlin  Site Prior to Removal:  Level 0  Pressure Applied For 30 MINUTES    Minutes Beginning at 1055am  Manual:   Yes.    Patient Status During Pull:  stable  Post Pull brachial Site:  Level 0  Post Pull Instructions Given:  Yes.    Post Pull Pulses Present:  Yes.    Dressing Applied:  Yes.    Comments:  Pt remain sable during sheath pull.  Pt denies any discomfort at this time.

## 2014-05-03 NOTE — Op Note (Signed)
Procedure: Abdominal aortogram, bilateral femoral and left brachial puncture, ultrasound bilateral groins and left brachial artery  Preoperative diagnosis: Claudication  Postoperative diagnosis: Same  Anesthesia: Local  Operative findings: #1 complete occlusion left common external and internal iliac artery                                 #2 severe diffuse disease right common and external iliac artery with multiple segments of calcified 50-70% stenosis with complete occlusion midsegment right external iliac artery                                 #3  50% stenosis infrarenal abdominal aorta  Operative details: After obtaining informed consent, the patient was brought to the [facility] lab. The patient was placed in supine position on the Angio table. Both groins were prepped and draped in usual sterile fashion. Local anesthesia was treated over the right common femoral artery. Ultrasound was used to identify the right common femoral artery. An introducer needle was used to cannulate the right common femoral artery. There was good pulsatile backbleeding. However an 035 versacore wire would not advance into the right external iliac artery system. I then attempted to use a micropuncture guidewire to advance into the iliac system and this also would not go. Attempts were abandoned and the right groin at this point. Hemostasis was obtained with direct pressure. Attention was then turned to the left groin. In similar fashion ultrasound was used to identify the left common femoral artery. A micropuncture needle was used to cannulate the left common femoral artery under ultrasound guidance. I was also unable to advance a micropuncture wire through the left external iliac artery. A contrast angiogram was obtained of the left iliac system through the micropuncture needle. This showed complete occlusion of the left external iliac artery. Attempts were then abandoned and the left groin. Hemostasis was obtained with direct  pressure. Attention was then turned to the left upper extremity. The entire left upper extremity was prepped and draped in usual sterile fashion. Local anesthesia was treated over the left brachial artery. Ultrasound was used to identify the left brachial artery near the antecubital crease. Micropuncture needle was used to cannulate the left brachial artery and the micropuncture needle advanced into the left brachial artery system. Micropuncture sheath was placed over this. The dilator and wire were then removed and replaced with an 035 versacore wire. The micropuncture sheath was removed over the guidewire and exchanged for a 5 French sheath. This was thoroughly flushed with heparinized saline. It was then flushed with 200 g of nitroglycerin and 2000 units of heparin. A 5 French pigtail catheter was then placed over the versacore wire and these were advanced as a unit up to the aortic arch. These were advanced through the left subclavian artery and down into the descending aorta. Abdominal aortogram was then obtained with a pigtail catheter in the upper abdominal aorta. Initial contrast angiogram was performed with carbon dioxide contrast due to the patient's elevated creatinine. The left and right renal arteries are patent.  The infrarenal abdominal aorta has a 70% stenosis at the bifurcation. The left common external and internal iliac artery is totally occluded. The right common and external iliac artery has multi segments of diffuse calcific stenosis. In order to obtain better images of the right iliac system contrast angiogram was performed for a  total of 40 cc of contrast. The patient did receive a total of 45 cc of contrast for the exam. With injection of contrast oblique views were also obtained of the pelvis. This shows the right common iliac has multiple segments of 50-70% stenosis. The mid right external iliac artery has a short complete occlusion. This also confirmed again the left common external and  internal iliac arteries are occluded. Lower extremity runoff views were not obtained due to again the patient's elevated creatinine.  At this point the apical catheter was removed over guidewire. The 5 French sheath was left in place to be pulled in the holding area. The patient tolerated the procedure well and there were no complications.  Operative management: I discussed with the patient today the possibility of angioplasty and stenting of the right iliac system via a left brachial approach with the possibility of a right to left femoral-femoral bypass. However in light of the patient's age and comorbidities. This would be a fairly high risk procedure. The patient currently has claudication symptoms alone. She will think about whether or not she wishes further intervention and call me if she wishes to pursue this at some point in the future.  [name], MD [facility] Office: [phone] Pager: [phone]

## 2014-05-03 NOTE — H&P (View-Only) (Signed)
[facility] HISTORY AND PHYSICAL    History of Present Illness:  Patient is a 80 y.o. year old female who presents follow-up evaluation of bilateral lower extremity pain. The patient develops pain in the back of both legs especially calf after walking up 2 or 3 aisles in a grocery store. She denies rest pain. She has no history of nonhealing ulcers on the feet. She does currently smoke 2-3 cigarettes per day. She previously had ABIs performed in 2010 which are 0.45 on the right 0.65 on the left. She recently had additional ABIs performed today [facility] which were 0.3 on the right 0.6 on the left. She had an aorto iliac duplex last week due to prior history of ectatic aorta. This showed a normal diameter aorta but also showed a short segment left common iliac occlusion with monophasic flow in the left external iliac. She also had a stenosis of the right common iliac artery.   She does have chronic back pain and a history of spinal stenosis. Other medical problems include atrial fibrillation, chronic kidney disease, hypertension, coronary artery disease of which are currently stable. Previous carotid duplex showed less than 50% stenosis bilaterally in 2013. At her previous office visit she had deferred arteriogram. She wishes to have her arteriogram after Thanksgiving.    Past Medical History   Diagnosis  Date   .  Anemia     .  Personal history of colonic polyps  05/01/2001       hyperplastic   .  Hypoparathyroidism     .  Diverticulosis     .  Vitamin D deficiency     .  Spinal stenosis     .  Osteoporosis     .  Chronic kidney disease     .  Atrial fibrillation     .  Hypercholesterolemia     .  Hypertension     .  GERD (gastroesophageal reflux disease)     .  CAD (coronary artery disease)     .  Carotid bruit         bilateral   .  Ankle edema         Past Surgical History   Procedure  Laterality  Date   .  S/p removal of bilateral kidney stones        .  Abdominal hysterectomy       .  Colonoscopy       .  Polypectomy       .  Cardioversion    01/04/2003   .  Transthoracic echocardiogram    03/2004       EF normal; RA mod-severely dilated, LA mod dilated; mild MR, mild TR; aortic valve mildly sclerotic; mild pulm valve regurg   .  Carotid duplex    01/10/2012       less than 50% bilat disease     Social History History   Substance Use Topics   .  Smoking status:  Current Some Day Smoker -- 0.25 packs/day       Types:  Cigarettes   .  Smokeless tobacco:  Never Used         Comment: smokes 2-3 cigs daily   .  Alcohol Use:  No         Comment: occ     Family History Family History   Problem  Relation  Age of Onset   .  Heart disease  Mother     .  Heart disease  Father     .  Colon cancer  Neg Hx       Allergies  No Known Allergies     Current Outpatient Prescriptions   Medication  Sig  Dispense  Refill   .  darbepoetin (ARANESP, ALBUMIN FREE,) 300 MCG/0.6ML SOLN injection  Inject 300 mcg into the skin every 30 (thirty) days.         Marland Kitchen  DILT-XR 120 MG 24 hr capsule  Take 120 mg by mouth daily.          Marland Kitchen  FeFum-FePoly-FA-B Cmp-C-Biot (INTEGRA PLUS PO)  Take 1 tablet by mouth daily.           .  furosemide (LASIX) 20 MG tablet  Take 20 mg by mouth daily as needed for fluid.         .  hydrALAZINE (APRESOLINE) 25 MG tablet  Take 1 tablet (25 mg total) by mouth 3 (three) times daily. <PLEASE MAKE APPOINTMENT FOR REFILLS>   270 tablet   0   .  Olmesartan-Amlodipine-HCTZ (TRIBENZOR PO)  Take 40 mg by mouth daily.         .  traMADol (ULTRAM) 50 MG tablet  Take 1 tablet (50 mg total) by mouth every 6 (six) hours as needed for moderate pain.   20 tablet   0   .  trolamine salicylate (ASPERCREME) 10 % cream  Apply 1 application topically as needed for muscle pain.            No current facility-administered medications for this visit.     ROS:    General:  No weight loss, Fever, chills  HEENT: No recent headaches, no  nasal bleeding, no visual changes, no sore throat  Neurologic: No dizziness, blackouts, seizures. No recent symptoms of stroke or mini- stroke. No recent episodes of slurred speech, or temporary blindness.  Cardiac: No recent episodes of chest pain/pressure, no shortness of breath at rest.  No shortness of breath with exertion.  Denies history of atrial fibrillation or irregular heartbeat  Vascular: No history of rest pain in feet.  + history of claudication.  No history of non-healing ulcer, No history of DVT    Pulmonary: No home oxygen, no productive cough, no hemoptysis,  No asthma or wheezing  Musculoskeletal:  [ x] Arthritis, [ ]  Low back pain,  [ ]  Joint pain  Hematologic:No history of hypercoagulable state.  No history of easy bleeding.  No history of anemia  Gastrointestinal: No hematochezia or melena,  No gastroesophageal reflux, no trouble swallowing  Urinary: [x chronic Kidney disease, [ ]  on HD - [ ]  MWF or [ ]  TTHS, [ ]  Burning with urination, [ ]  Frequent urination, [ ]  Difficulty urinating;    Skin: No rashes  Psychological: No history of anxiety,  No history of depression   Physical Examination  Filed Vitals:   04/18/14 1121  BP: 157/60  Pulse: 74  Temp: 98.5 F (36.9 C)  TempSrc: Oral  Resp: 16  Height: 5' 2.5" (1.588 m)  Weight: 123 lb (55.792 kg)  SpO2: 98%    General:  Alert and oriented, no acute distress HEENT: Normal Neck: No bruit or JVD Pulmonary: Clear to auscultation bilaterally Cardiac: Regular Rate and Rhythm with 3/6 systolic murmur Abdomen: Soft, non-tender, non-distended, no mass, no scars, aortic pulsation present slightly enlarged at 3 cm on palpation Skin: No rash Extremity Pulses:  2+ radial, brachial, absent femoral, dorsalis pedis, posterior tibial pulses bilaterally  Musculoskeletal: No deformity or edema     Neurologic: Upper and lower extremity motor 5/5 and symmetric  DATA:  Recent pulse volume recordings reviewed which showed  monophasic Doppler flow from the groin down.  She'll lower extremity arterial duplex in our office recently. This showed again monophasic flow suggestive of proximal inflow disease. She also had occlusion of her right superficial femoral artery and possibly the left superficial femoral artery   ASSESSMENT:  Aortoiliac and superficial femoral artery occlusive disease. Tobacco abuse. The patient has mildly limiting claudication symptoms. She also has a history of an ectatic aorta in her aorta is slightly dilated on my exam today.   PLAN:  Aortogram lower extremity runoff possible bilateral iliac intervention scheduled for 05/03/2014. Risks benefits possible palpitations and procedure details were trying to the patient and her family today. These include but are not limited to bleeding infection contrast reaction vessel injury. She understands and agrees to proceed. If we are unable to fix this percutaneously we will have to have additional conversation on whether or not she would want to proceed with an operation. With her age I would be inclined to not do an open surgical operation for claudication symptoms alone but would reserve this only for limb threatening ischemia.  She is not on any anticoagulation with antiplatelet agent or otherwise for PAD or atrial fibrillation. The patient states this was due to a GI bleed in the past. If we do an intervention we will also need to revisit this.  [name], MD [facility] Office: [phone] Pager: [phone]

## 2014-05-04 LAB — POCT ACTIVATED CLOTTING TIME: ACTIVATED CLOTTING TIME: 128 s

## 2014-05-06 ENCOUNTER — Telehealth: Payer: Self-pay | Admitting: Vascular Surgery

## 2014-05-06 LAB — POCT I-STAT, CHEM 8
BUN: 34 mg/dL — ABNORMAL HIGH (ref 6–23)
Calcium, Ion: 1.42 mmol/L — ABNORMAL HIGH (ref 1.13–1.30)
Chloride: 109 mEq/L (ref 96–112)
Creatinine, Ser: 1.9 mg/dL — ABNORMAL HIGH (ref 0.50–1.10)
Glucose, Bld: 90 mg/dL (ref 70–99)
HEMATOCRIT: 40 % (ref 36.0–46.0)
Hemoglobin: 13.6 g/dL (ref 12.0–15.0)
POTASSIUM: 4.2 meq/L (ref 3.7–5.3)
Sodium: 143 mEq/L (ref 137–147)
TCO2: 21 mmol/L (ref 0–100)

## 2014-05-06 NOTE — Telephone Encounter (Signed)
-----   Message from Mena Goes, RN sent at 05/03/2014  4:04 PM EST ----- Regarding: Schedule No follow up at this time. FYI    ----- Message -----    From: Elam Dutch, MD    Sent: 05/03/2014   1:50 PM      To: [facility] Charge Pool  Aortogram with bilat runoff  Ultrasound bilteral femoral and left brachial  Puncture bilateral femoral and left brachial  Pt will call if she wants further intervention  [name]

## 2014-05-07 LAB — POCT ACTIVATED CLOTTING TIME: Activated Clotting Time: 190 seconds

## 2014-05-09 ENCOUNTER — Encounter (HOSPITAL_COMMUNITY): Payer: Self-pay | Admitting: Vascular Surgery

## 2014-05-20 ENCOUNTER — Ambulatory Visit: Payer: Medicare Other

## 2014-05-20 ENCOUNTER — Ambulatory Visit (HOSPITAL_BASED_OUTPATIENT_CLINIC_OR_DEPARTMENT_OTHER): Payer: Medicare Other

## 2014-05-20 DIAGNOSIS — D638 Anemia in other chronic diseases classified elsewhere: Secondary | ICD-10-CM

## 2014-05-20 DIAGNOSIS — N289 Disorder of kidney and ureter, unspecified: Secondary | ICD-10-CM

## 2014-05-20 DIAGNOSIS — D649 Anemia, unspecified: Secondary | ICD-10-CM

## 2014-05-20 LAB — CBC WITH DIFFERENTIAL/PLATELET
BASO%: 2.7 % — ABNORMAL HIGH (ref 0.0–2.0)
Basophils Absolute: 0.2 10*3/uL — ABNORMAL HIGH (ref 0.0–0.1)
EOS ABS: 0.1 10*3/uL (ref 0.0–0.5)
EOS%: 1.2 % (ref 0.0–7.0)
HCT: 36.7 % (ref 34.8–46.6)
HGB: 12.6 g/dL (ref 11.6–15.9)
LYMPH%: 28.6 % (ref 14.0–49.7)
MCH: 29 pg (ref 25.1–34.0)
MCHC: 34.3 g/dL (ref 31.5–36.0)
MCV: 84.4 fL (ref 79.5–101.0)
MONO#: 0.8 10*3/uL (ref 0.1–0.9)
MONO%: 12.7 % (ref 0.0–14.0)
NEUT%: 54.8 % (ref 38.4–76.8)
NEUTROS ABS: 3.6 10*3/uL (ref 1.5–6.5)
NRBC: 1 % — AB (ref 0–0)
Platelets: 155 10*3/uL (ref 145–400)
RBC: 4.35 10*6/uL (ref 3.70–5.45)
RDW: 15.6 % — AB (ref 11.2–14.5)
WBC: 6.6 10*3/uL (ref 3.9–10.3)
lymph#: 1.9 10*3/uL (ref 0.9–3.3)

## 2014-05-26 ENCOUNTER — Other Ambulatory Visit: Payer: Self-pay | Admitting: Cardiovascular Disease

## 2014-05-27 NOTE — Telephone Encounter (Signed)
Rx refill sent to patient pharmacy  Patient needs appointment for further refills.

## 2014-06-17 ENCOUNTER — Other Ambulatory Visit (HOSPITAL_BASED_OUTPATIENT_CLINIC_OR_DEPARTMENT_OTHER): Payer: Medicare Other

## 2014-06-17 ENCOUNTER — Ambulatory Visit: Payer: Medicare Other

## 2014-06-17 DIAGNOSIS — D649 Anemia, unspecified: Secondary | ICD-10-CM

## 2014-06-17 DIAGNOSIS — D638 Anemia in other chronic diseases classified elsewhere: Secondary | ICD-10-CM

## 2014-06-17 LAB — CBC WITH DIFFERENTIAL/PLATELET
BASO%: 0.7 % (ref 0.0–2.0)
BASOS ABS: 0.1 10*3/uL (ref 0.0–0.1)
EOS%: 1.4 % (ref 0.0–7.0)
Eosinophils Absolute: 0.1 10*3/uL (ref 0.0–0.5)
HCT: 34.4 % — ABNORMAL LOW (ref 34.8–46.6)
HGB: 11.6 g/dL (ref 11.6–15.9)
LYMPH%: 28.7 % (ref 14.0–49.7)
MCH: 28.2 pg (ref 25.1–34.0)
MCHC: 33.7 g/dL (ref 31.5–36.0)
MCV: 83.7 fL (ref 79.5–101.0)
MONO#: 1 10*3/uL — ABNORMAL HIGH (ref 0.1–0.9)
MONO%: 13.3 % (ref 0.0–14.0)
NEUT%: 55.9 % (ref 38.4–76.8)
NEUTROS ABS: 4.1 10*3/uL (ref 1.5–6.5)
Platelets: 145 10*3/uL (ref 145–400)
RBC: 4.11 10*6/uL (ref 3.70–5.45)
RDW: 14.8 % — ABNORMAL HIGH (ref 11.2–14.5)
WBC: 7.3 10*3/uL (ref 3.9–10.3)
lymph#: 2.1 10*3/uL (ref 0.9–3.3)
nRBC: 0 % (ref 0–0)

## 2014-06-17 MED ORDER — DARBEPOETIN ALFA 300 MCG/0.6ML IJ SOSY: 300.0000 ug | PREFILLED_SYRINGE | Freq: Once | INTRAMUSCULAR | Status: DC

## 2014-06-26 ENCOUNTER — Other Ambulatory Visit: Payer: Self-pay | Admitting: Cardiovascular Disease

## 2014-06-26 NOTE — Telephone Encounter (Signed)
Rx(s) sent to pharmacy electronically. Message sent to [facility] (scheduler) to contact patient for appointment

## 2014-06-28 ENCOUNTER — Other Ambulatory Visit: Payer: Self-pay | Admitting: Cardiovascular Disease

## 2014-06-28 ENCOUNTER — Telehealth: Payer: Self-pay | Admitting: Cardiovascular Disease

## 2014-06-28 NOTE — Telephone Encounter (Signed)
Hydralazine refilled for 30 day supply 06/26/14 - Billie LM for patient to call back for appmt 06/28/14

## 2014-07-10 NOTE — Telephone Encounter (Signed)
Closed encounter °

## 2014-07-15 ENCOUNTER — Other Ambulatory Visit: Payer: Medicare Other

## 2014-07-15 ENCOUNTER — Ambulatory Visit: Payer: Medicare Other | Admitting: Internal Medicine

## 2014-07-15 ENCOUNTER — Telehealth: Payer: Self-pay | Admitting: Internal Medicine

## 2014-07-15 ENCOUNTER — Ambulatory Visit: Payer: Medicare Other

## 2014-07-15 NOTE — Telephone Encounter (Signed)
Left message to confirm appointment for 02/19.

## 2014-07-18 ENCOUNTER — Telehealth: Payer: Self-pay | Admitting: Internal Medicine

## 2014-07-18 NOTE — Telephone Encounter (Signed)
pt called to cx appt...she will call back to r/s  °

## 2014-07-19 ENCOUNTER — Ambulatory Visit: Payer: Medicare Other | Admitting: Physician Assistant

## 2014-07-19 ENCOUNTER — Other Ambulatory Visit: Payer: Medicare Other

## 2014-07-19 ENCOUNTER — Ambulatory Visit: Payer: Self-pay

## 2014-07-25 ENCOUNTER — Other Ambulatory Visit: Payer: Self-pay | Admitting: Nurse Practitioner

## 2014-07-25 ENCOUNTER — Telehealth: Payer: Self-pay | Admitting: Vascular Surgery

## 2014-07-25 NOTE — Telephone Encounter (Signed)
Spoke with patient to schedule. She will call me back if she can not get a ride for an appointment on 08/01/14 @ 830am. dpm

## 2014-07-25 NOTE — Telephone Encounter (Signed)
-----   Message from Sherrye Payor, RN sent at 07/25/2014 12:33 PM EST ----- Regarding: needs appt. with [facility] Contact: [phone] Please schedule this pt. for appt. with Dr. [name] to discuss surgery @ next available; had aortogram 04/2014.  Per Dr. [name], she doesn't need any vascular studies.

## 2014-07-31 ENCOUNTER — Encounter: Payer: Self-pay | Admitting: Vascular Surgery

## 2014-07-31 ENCOUNTER — Other Ambulatory Visit: Payer: Self-pay | Admitting: Cardiovascular Disease

## 2014-07-31 NOTE — Telephone Encounter (Signed)
Rx(s) sent to pharmacy electronically. Ov 08/21/14

## 2014-08-01 ENCOUNTER — Encounter: Payer: Self-pay | Admitting: Vascular Surgery

## 2014-08-01 ENCOUNTER — Other Ambulatory Visit: Payer: Self-pay

## 2014-08-01 ENCOUNTER — Ambulatory Visit (INDEPENDENT_AMBULATORY_CARE_PROVIDER_SITE_OTHER)
Admission: RE | Admit: 2014-08-01 | Discharge: 2014-08-01 | Disposition: A | Discharge status: Home / Self Care | Payer: Medicare Other | Source: Ambulatory Visit | Attending: Vascular Surgery | Admitting: Vascular Surgery

## 2014-08-01 ENCOUNTER — Ambulatory Visit (INDEPENDENT_AMBULATORY_CARE_PROVIDER_SITE_OTHER): Payer: Medicare Other | Admitting: Vascular Surgery

## 2014-08-01 ENCOUNTER — Ambulatory Visit (HOSPITAL_COMMUNITY)
Admission: RE | Admit: 2014-08-01 | Discharge: 2014-08-01 | Disposition: A | Discharge status: Home / Self Care | Payer: Medicare Other | Source: Ambulatory Visit | Attending: Vascular Surgery | Admitting: Vascular Surgery

## 2014-08-01 VITALS — BP 129/69 | HR 86 | Temp 97.5°F | Resp 16 | Ht 62.0 in | Wt 124.0 lb

## 2014-08-01 DIAGNOSIS — I739 Peripheral vascular disease, unspecified: Secondary | ICD-10-CM | POA: Insufficient documentation

## 2014-08-01 DIAGNOSIS — R0989 Other specified symptoms and signs involving the circulatory and respiratory systems: Secondary | ICD-10-CM

## 2014-08-01 NOTE — Progress Notes (Signed)
[facility] [place] HISTORY AND PHYSICAL    History of Present Illness:  Patient is a 81 y.o. year old female who presents follow-up evaluation of bilateral lower extremity pain. The patient develops pain in the back of both legs especially calf after walking up 2 or 3 aisles in a grocery store. She denies rest pain. She has no history of nonhealing ulcers on the feet. She does currently smoke 2-3 cigarettes per day. She previously had ABIs performed in 2010 which are 0.45 on the right 0.65 on the left. She recently had additional ABIs performed today [facility] which were 0.3 on the right 0.6 on the left. She had an aorto iliac duplex several weeks ago due to prior history of ectatic aorta. This showed a normal diameter aorta but also showed a short segment left common iliac occlusion with monophasic flow in the left external iliac. She also had a stenosis of the right common iliac artery.   She does have chronic back pain and a history of spinal stenosis. Other medical problems include atrial fibrillation, chronic kidney disease, hypertension, coronary artery disease of which are currently stable. Previous carotid duplex showed less than 50% stenosis bilaterally in 2013.   She had an arteriogram performed in December which showed a left common external and internal iliac artery occlusion. She also had subtotal occlusion of the right external iliac artery with a moderate 50-60% right common iliac artery stenosis. The runoff was not performed secondary to elevated creatinine. At that time I offered the patient possible angioplasty or stenting of the external iliac artery and then possible femorofemoral bypass. She deferred at that time but now wishes to consider operation.     Past Medical History    Diagnosis   Date    .   Anemia       .   Personal history of colonic polyps   05/01/2001          hyperplastic    .   Hypoparathyroidism       .   Diverticulosis       .    Vitamin D deficiency       .   Spinal stenosis       .   Osteoporosis       .   Chronic kidney disease       .   Atrial fibrillation       .   Hypercholesterolemia       .   Hypertension       .   GERD (gastroesophageal reflux disease)       .   CAD (coronary artery disease)       .   Carotid bruit             bilateral    .   Ankle edema           Past Surgical History    Procedure   Laterality   Date    .   S/p removal of bilateral kidney stones          .   Abdominal hysterectomy          .   Colonoscopy          .   Polypectomy          .   Cardioversion      01/04/2003    .   Transthoracic echocardiogram      03/2004  EF normal; RA mod-severely dilated, LA mod dilated; mild MR, mild TR; aortic valve mildly sclerotic; mild pulm valve regurg    .   Carotid duplex      01/10/2012          less than 50% bilat disease      Social History History    Substance Use Topics    .   Smoking status:   Current Some Day Smoker -- 0.25 packs/day          Types:   Cigarettes    .   Smokeless tobacco:   Never Used             Comment: smokes 2-3 cigs daily    .   Alcohol Use:   No             Comment: occ      Family History Family History    Problem   Relation   Age of Onset    .   Heart disease   Mother       .   Heart disease   Father       .   Colon cancer   Neg Hx         Allergies  No Known Allergies     Current Outpatient Prescriptions on File Prior to Visit  Medication Sig Dispense Refill  . darbepoetin (ARANESP, ALBUMIN FREE,) 300 MCG/0.6ML SOLN injection Inject 300 mcg into the skin every 30 (thirty) days.    Marland Kitchen diltiazem (DILACOR XR) 180 MG 24 hr capsule Take 180 mg by mouth daily.    Marland Kitchen FeFum-FePoly-FA-B Cmp-C-Biot (INTEGRA PLUS PO) Take 1 tablet by mouth daily.      . furosemide (LASIX) 20 MG tablet Take 20 mg by mouth daily as needed for fluid.    . hydrALAZINE (APRESOLINE) 25 MG tablet Take 1 tablet (25 mg total) by mouth 3 (three) times daily. <MUST KEEP  APPOINTMENT 08/21/14> 90 tablet 0  . Olmesartan-Amlodipine-HCTZ (TRIBENZOR) 40-5-12.5 MG TABS Take 1 tablet by mouth daily.    . traMADol (ULTRAM) 50 MG tablet Take 1 tablet (50 mg total) by mouth every 6 (six) hours as needed for moderate pain. 20 tablet 0  . trolamine salicylate (ASPERCREME) 10 % cream Apply 1 application topically as needed for muscle pain.     No current facility-administered medications on file prior to visit.    ROS:    General:  No weight loss, Fever, chills  HEENT: No recent headaches, no nasal bleeding, no visual changes, no sore throat  Neurologic: No dizziness, blackouts, seizures. No recent symptoms of stroke or mini- stroke. No recent episodes of slurred speech, or temporary blindness.  Cardiac: No recent episodes of chest pain/pressure, no shortness of breath at rest.  No shortness of breath with exertion.  Denies history of atrial fibrillation or irregular heartbeat  Vascular: No history of rest pain in feet.  + history of claudication.  No history of non-healing ulcer, No history of DVT    Pulmonary: No home oxygen, no productive cough, no hemoptysis,  No asthma or wheezing  Musculoskeletal:  [ x] Arthritis, [ ]  Low back pain,  [ ]  Joint pain  Hematologic:No history of hypercoagulable state.  No history of easy bleeding.  No history of anemia  Gastrointestinal: No hematochezia or melena,  No gastroesophageal reflux, no trouble swallowing  Urinary: [x chronic Kidney disease, [ ]  on HD - [ ]  MWF or [ ]  TTHS, [ ]   Burning with urination, [ ]  Frequent urination, [ ]  Difficulty urinating;    Skin: No rashes  Psychological: No history of anxiety,  No history of depression   Physical Examination    Filed Vitals:   08/01/14 0850  BP: 129/69  Pulse: 86  Temp: 97.5 F (36.4 C)  TempSrc: Oral  Resp: 16  Height: 5\' 2"  (1.575 m)  Weight: 124 lb (56.246 kg)  SpO2: 96%    General:  Alert and oriented, no acute distress HEENT: Normal Neck: Left  carotid bruit or JVD Pulmonary: Clear to auscultation bilaterally Cardiac: Regular Rate and Rhythm with 3/6 systolic murmur Abdomen: Soft, non-tender, non-distended, no mass, no scars, aortic pulsation on palpation Skin: No rash Extremity Pulses:  2+ radial, brachial, absent femoral, dorsalis pedis, posterior tibial pulses bilaterally Musculoskeletal: No deformity or edema     Neurologic: Upper and lower extremity motor 5/5 and symmetric  DATA:  bilateral ABIs were performed today which were 0.3 bilaterally. Additionally she had bilateral carotid duplex exam which showed less than 40% internal carotid artery stenosis. I reviewed and interpreted both of these studies.  ASSESSMENT:  Aortoiliac and superficial femoral artery occlusive disease. Tobacco abuse. The patient has lifestyle limiting limiting claudication symptoms. I had a very frank discussion with her today about her age and risk of procedures. I discussed with her the risk of limb loss with any intervention. However, she has had significant decline in her ABIs and although she does not have rest pain she probably does have some risk of limb loss without intervention. She does have known renal dysfunction and I discussed with her the possibility that she could end up on dialysis but we would try to minimize contrast. She is not really a candidate for aortobifemoral bypass due to her overall medical cor morbidities and age. If I am unable to open the right external iliac artery percutaneously we would need to consider axillary bifemoral bypass.   PLAN:  aortogram with lower extremity runoff possible intervention for her right external iliac artery subtotal occlusion.  Initial attempt will be via the right groin. However, there was some difficulty accessing the right groin on her previous procedure. If were unsuccessful in accessing the right groin would be go to the left arm. I did discuss the patient that we may not have catheters long enough to  reach the stenosis from the arm and if that's the case again we would need to discuss possible axillary bifemoral bypass. Risks benefits possible palpitations and procedure details were discussed the patient today including vessel injury possible limb loss contrast nephropathy and possible dialysis. Other small risks of bleeding and infection were also discussed. Due to the patient's renal dysfunction we will plan on admitting her for 23 hour observation postprocedure.    She is not on any anticoagulation with antiplatelet agent or otherwise for PAD or atrial fibrillation. The patient states this was due to a GI bleed in the past. If we do an intervention we will also need to revisit this.  [name], MD [facility] [place] Office: [phone] Pager: [phone]

## 2014-08-15 MED ORDER — SODIUM CHLORIDE 0.9 % IV SOLN
INTRAVENOUS | Status: DC
  Administered 2014-08-16: 08:00:00 via INTRAVENOUS

## 2014-08-16 ENCOUNTER — Telehealth: Payer: Self-pay | Admitting: Vascular Surgery

## 2014-08-16 ENCOUNTER — Encounter (HOSPITAL_COMMUNITY)
Admission: RE | Disposition: A | Discharge status: Home / Self Care | Payer: Self-pay | Source: Ambulatory Visit | Attending: Vascular Surgery

## 2014-08-16 ENCOUNTER — Ambulatory Visit (HOSPITAL_COMMUNITY)
Admission: RE | Admit: 2014-08-16 | Discharge: 2014-08-16 | Disposition: A | Discharge status: Home / Self Care | Payer: Medicare Other | Source: Ambulatory Visit | Attending: Vascular Surgery | Admitting: Vascular Surgery

## 2014-08-16 ENCOUNTER — Other Ambulatory Visit: Payer: Self-pay | Admitting: *Deleted

## 2014-08-16 DIAGNOSIS — I70211 Atherosclerosis of native arteries of extremities with intermittent claudication, right leg: Secondary | ICD-10-CM | POA: Diagnosis not present

## 2014-08-16 DIAGNOSIS — Z01810 Encounter for preprocedural cardiovascular examination: Secondary | ICD-10-CM

## 2014-08-16 DIAGNOSIS — Z79899 Other long term (current) drug therapy: Secondary | ICD-10-CM | POA: Insufficient documentation

## 2014-08-16 DIAGNOSIS — K219 Gastro-esophageal reflux disease without esophagitis: Secondary | ICD-10-CM | POA: Diagnosis not present

## 2014-08-16 DIAGNOSIS — N189 Chronic kidney disease, unspecified: Secondary | ICD-10-CM | POA: Insufficient documentation

## 2014-08-16 DIAGNOSIS — F17210 Nicotine dependence, cigarettes, uncomplicated: Secondary | ICD-10-CM | POA: Diagnosis not present

## 2014-08-16 DIAGNOSIS — M549 Dorsalgia, unspecified: Secondary | ICD-10-CM | POA: Diagnosis not present

## 2014-08-16 DIAGNOSIS — I2510 Atherosclerotic heart disease of native coronary artery without angina pectoris: Secondary | ICD-10-CM | POA: Diagnosis not present

## 2014-08-16 DIAGNOSIS — D649 Anemia, unspecified: Secondary | ICD-10-CM | POA: Diagnosis not present

## 2014-08-16 DIAGNOSIS — I129 Hypertensive chronic kidney disease with stage 1 through stage 4 chronic kidney disease, or unspecified chronic kidney disease: Secondary | ICD-10-CM | POA: Insufficient documentation

## 2014-08-16 DIAGNOSIS — I739 Peripheral vascular disease, unspecified: Secondary | ICD-10-CM

## 2014-08-16 DIAGNOSIS — I4891 Unspecified atrial fibrillation: Secondary | ICD-10-CM | POA: Insufficient documentation

## 2014-08-16 DIAGNOSIS — E780 Pure hypercholesterolemia: Secondary | ICD-10-CM | POA: Diagnosis not present

## 2014-08-16 DIAGNOSIS — G8929 Other chronic pain: Secondary | ICD-10-CM | POA: Insufficient documentation

## 2014-08-16 DIAGNOSIS — R944 Abnormal results of kidney function studies: Secondary | ICD-10-CM | POA: Insufficient documentation

## 2014-08-16 DIAGNOSIS — Z5309 Procedure and treatment not carried out because of other contraindication: Secondary | ICD-10-CM | POA: Diagnosis not present

## 2014-08-16 LAB — POCT I-STAT, CHEM 8
BUN: 46 mg/dL — AB (ref 6–23)
CREATININE: 2.2 mg/dL — AB (ref 0.50–1.10)
Calcium, Ion: 1.44 mmol/L — ABNORMAL HIGH (ref 1.13–1.30)
Chloride: 110 mmol/L (ref 96–112)
Glucose, Bld: 87 mg/dL (ref 70–99)
HCT: 34 % — ABNORMAL LOW (ref 36.0–46.0)
HEMOGLOBIN: 11.6 g/dL — AB (ref 12.0–15.0)
POTASSIUM: 4.8 mmol/L (ref 3.5–5.1)
Sodium: 138 mmol/L (ref 135–145)
TCO2: 15 mmol/L (ref 0–100)

## 2014-08-16 SURGERY — ABDOMINAL AORTAGRAM
Anesthesia: LOCAL

## 2014-08-16 NOTE — Interval H&P Note (Signed)
History and Physical Interval Note:  08/16/2014 8:50 AM  Pam [name]  has presented today for surgery, with the diagnosis of pad.  Her creatinine was noted to be elevated at > 2.  This is increased from December.  She currently does not have limb threatening ischemia and I do not believe benefit of addressing her PAD today outweighs risk of contrast nephropathy.  Procedure will be cancelled.  Details discussed with pt and daughter at length.  She will follow up with me in 43month. Sherylann Vangorden E

## 2014-08-16 NOTE — Telephone Encounter (Signed)
-----   Message from Mena Goes, RN sent at 08/16/2014  9:39 AM EDT ----- Regarding: Schedule   ----- Message -----    From: Elam Dutch, MD    Sent: 08/16/2014   8:53 AM      To: [facility] Charge Pool  Pt angio cancelled today due to elevation in creatinine  Please schedule her to see me in 1 month with bilateral ABI at that visit.  [name], MD [facility] [place] Office: [phone] Pager: [phone]

## 2014-08-16 NOTE — Telephone Encounter (Signed)
LM for pt re appt and mailed letter, dpm

## 2014-08-16 NOTE — H&P (View-Only) (Signed)
[facility] [place] HISTORY AND PHYSICAL    History of Present Illness:  Patient is a 81 y.o. year old female who presents follow-up evaluation of bilateral lower extremity pain. The patient develops pain in the back of both legs especially calf after walking up 2 or 3 aisles in a grocery store. She denies rest pain. She has no history of nonhealing ulcers on the feet. She does currently smoke 2-3 cigarettes per day. She previously had ABIs performed in 2010 which are 0.45 on the right 0.65 on the left. She recently had additional ABIs performed today [facility] which were 0.3 on the right 0.6 on the left. She had an aorto iliac duplex several weeks ago due to prior history of ectatic aorta. This showed a normal diameter aorta but also showed a short segment left common iliac occlusion with monophasic flow in the left external iliac. She also had a stenosis of the right common iliac artery.   She does have chronic back pain and a history of spinal stenosis. Other medical problems include atrial fibrillation, chronic kidney disease, hypertension, coronary artery disease of which are currently stable. Previous carotid duplex showed less than 50% stenosis bilaterally in 2013.   She had an arteriogram performed in December which showed a left common external and internal iliac artery occlusion. She also had subtotal occlusion of the right external iliac artery with a moderate 50-60% right common iliac artery stenosis. The runoff was not performed secondary to elevated creatinine. At that time I offered the patient possible angioplasty or stenting of the external iliac artery and then possible femorofemoral bypass. She deferred at that time but now wishes to consider operation.     Past Medical History    Diagnosis   Date    .   Anemia       .   Personal history of colonic polyps   05/01/2001          hyperplastic    .   Hypoparathyroidism       .   Diverticulosis       .    Vitamin D deficiency       .   Spinal stenosis       .   Osteoporosis       .   Chronic kidney disease       .   Atrial fibrillation       .   Hypercholesterolemia       .   Hypertension       .   GERD (gastroesophageal reflux disease)       .   CAD (coronary artery disease)       .   Carotid bruit             bilateral    .   Ankle edema           Past Surgical History    Procedure   Laterality   Date    .   S/p removal of bilateral kidney stones          .   Abdominal hysterectomy          .   Colonoscopy          .   Polypectomy          .   Cardioversion      01/04/2003    .   Transthoracic echocardiogram      03/2004  EF normal; RA mod-severely dilated, LA mod dilated; mild MR, mild TR; aortic valve mildly sclerotic; mild pulm valve regurg    .   Carotid duplex      01/10/2012          less than 50% bilat disease      Social History History    Substance Use Topics    .   Smoking status:   Current Some Day Smoker -- 0.25 packs/day          Types:   Cigarettes    .   Smokeless tobacco:   Never Used             Comment: smokes 2-3 cigs daily    .   Alcohol Use:   No             Comment: occ      Family History Family History    Problem   Relation   Age of Onset    .   Heart disease   Mother       .   Heart disease   Father       .   Colon cancer   Neg Hx         Allergies  No Known Allergies     Current Outpatient Prescriptions on File Prior to Visit  Medication Sig Dispense Refill  . darbepoetin (ARANESP, ALBUMIN FREE,) 300 MCG/0.6ML SOLN injection Inject 300 mcg into the skin every 30 (thirty) days.    Marland Kitchen diltiazem (DILACOR XR) 180 MG 24 hr capsule Take 180 mg by mouth daily.    Marland Kitchen FeFum-FePoly-FA-B Cmp-C-Biot (INTEGRA PLUS PO) Take 1 tablet by mouth daily.      . furosemide (LASIX) 20 MG tablet Take 20 mg by mouth daily as needed for fluid.    . hydrALAZINE (APRESOLINE) 25 MG tablet Take 1 tablet (25 mg total) by mouth 3 (three) times daily. <MUST KEEP  APPOINTMENT 08/21/14> 90 tablet 0  . Olmesartan-Amlodipine-HCTZ (TRIBENZOR) 40-5-12.5 MG TABS Take 1 tablet by mouth daily.    . traMADol (ULTRAM) 50 MG tablet Take 1 tablet (50 mg total) by mouth every 6 (six) hours as needed for moderate pain. 20 tablet 0  . trolamine salicylate (ASPERCREME) 10 % cream Apply 1 application topically as needed for muscle pain.     No current facility-administered medications on file prior to visit.    ROS:    General:  No weight loss, Fever, chills  HEENT: No recent headaches, no nasal bleeding, no visual changes, no sore throat  Neurologic: No dizziness, blackouts, seizures. No recent symptoms of stroke or mini- stroke. No recent episodes of slurred speech, or temporary blindness.  Cardiac: No recent episodes of chest pain/pressure, no shortness of breath at rest.  No shortness of breath with exertion.  Denies history of atrial fibrillation or irregular heartbeat  Vascular: No history of rest pain in feet.  + history of claudication.  No history of non-healing ulcer, No history of DVT    Pulmonary: No home oxygen, no productive cough, no hemoptysis,  No asthma or wheezing  Musculoskeletal:  [ x] Arthritis, [ ]  Low back pain,  [ ]  Joint pain  Hematologic:No history of hypercoagulable state.  No history of easy bleeding.  No history of anemia  Gastrointestinal: No hematochezia or melena,  No gastroesophageal reflux, no trouble swallowing  Urinary: [x chronic Kidney disease, [ ]  on HD - [ ]  MWF or [ ]  TTHS, [ ]   Burning with urination, [ ]  Frequent urination, [ ]  Difficulty urinating;    Skin: No rashes  Psychological: No history of anxiety,  No history of depression   Physical Examination    Filed Vitals:   08/01/14 0850  BP: 129/69  Pulse: 86  Temp: 97.5 F (36.4 C)  TempSrc: Oral  Resp: 16  Height: 5\' 2"  (1.575 m)  Weight: 124 lb (56.246 kg)  SpO2: 96%    General:  Alert and oriented, no acute distress HEENT: Normal Neck: Left  carotid bruit or JVD Pulmonary: Clear to auscultation bilaterally Cardiac: Regular Rate and Rhythm with 3/6 systolic murmur Abdomen: Soft, non-tender, non-distended, no mass, no scars, aortic pulsation on palpation Skin: No rash Extremity Pulses:  2+ radial, brachial, absent femoral, dorsalis pedis, posterior tibial pulses bilaterally Musculoskeletal: No deformity or edema     Neurologic: Upper and lower extremity motor 5/5 and symmetric  DATA:  bilateral ABIs were performed today which were 0.3 bilaterally. Additionally she had bilateral carotid duplex exam which showed less than 40% internal carotid artery stenosis. I reviewed and interpreted both of these studies.  ASSESSMENT:  Aortoiliac and superficial femoral artery occlusive disease. Tobacco abuse. The patient has lifestyle limiting limiting claudication symptoms. I had a very frank discussion with her today about her age and risk of procedures. I discussed with her the risk of limb loss with any intervention. However, she has had significant decline in her ABIs and although she does not have rest pain she probably does have some risk of limb loss without intervention. She does have known renal dysfunction and I discussed with her the possibility that she could end up on dialysis but we would try to minimize contrast. She is not really a candidate for aortobifemoral bypass due to her overall medical cor morbidities and age. If I am unable to open the right external iliac artery percutaneously we would need to consider axillary bifemoral bypass.   PLAN:  aortogram with lower extremity runoff possible intervention for her right external iliac artery subtotal occlusion.  Initial attempt will be via the right groin. However, there was some difficulty accessing the right groin on her previous procedure. If were unsuccessful in accessing the right groin would be go to the left arm. I did discuss the patient that we may not have catheters long enough to  reach the stenosis from the arm and if that's the case again we would need to discuss possible axillary bifemoral bypass. Risks benefits possible palpitations and procedure details were discussed the patient today including vessel injury possible limb loss contrast nephropathy and possible dialysis. Other small risks of bleeding and infection were also discussed. Due to the patient's renal dysfunction we will plan on admitting her for 23 hour observation postprocedure.    She is not on any anticoagulation with antiplatelet agent or otherwise for PAD or atrial fibrillation. The patient states this was due to a GI bleed in the past. If we do an intervention we will also need to revisit this.  [name], MD [facility] [place] Office: [phone] Pager: [phone]

## 2014-08-21 ENCOUNTER — Encounter: Payer: Self-pay | Admitting: Cardiovascular Disease

## 2014-08-21 ENCOUNTER — Ambulatory Visit (INDEPENDENT_AMBULATORY_CARE_PROVIDER_SITE_OTHER): Payer: Medicare Other | Admitting: Cardiovascular Disease

## 2014-08-21 VITALS — BP 126/64 | HR 107 | Ht 62.0 in | Wt 126.1 lb

## 2014-08-21 DIAGNOSIS — I739 Peripheral vascular disease, unspecified: Secondary | ICD-10-CM

## 2014-08-21 DIAGNOSIS — I482 Chronic atrial fibrillation, unspecified: Secondary | ICD-10-CM

## 2014-08-21 DIAGNOSIS — I10 Essential (primary) hypertension: Secondary | ICD-10-CM

## 2014-08-21 NOTE — Assessment & Plan Note (Signed)
Left carotid bruit with minimal changes on recent carotid duplex which was ordered by Dr. [name].

## 2014-08-21 NOTE — Assessment & Plan Note (Signed)
History of hypertension blood pressure measured at 126/64. She is on olmesartan, amlodipine, hydrochlorothiazide, diltiazem and hydralazine. Continue current meds at current dosing

## 2014-08-21 NOTE — Patient Instructions (Signed)
Your physician wants you to follow-up in: 1 year with Dr Berry. You will receive a reminder letter in the mail two months in advance. If you don't receive a letter, please call our office to schedule the follow-up appointment.  

## 2014-08-21 NOTE — Progress Notes (Signed)
08/21/2014 Pam [name]   1925-08-15  [id number]  Primary Physician [name], MD Primary Cardiologist: [name] MD [name]   HPI:  Pam [name] is a 81 year old thin appearing married African American female patient of Dr. [name] in the past. I last saw her in the office 01/15/13.She has a history of chronic A. Fib rate controlled. She was on oral and to coagulation in the past which was discontinued by Dr. [name] because of melena. She has treated hypertension, hyperlipidemia as well as was moderate carotid disease. She denies chest pain or shortness of breath. She saw Dr. [name] recently for claudication. An aortogram was performed however intervention was deferred because of chronic renal insufficiency with concern over the potential for radiocontrast nephropathy.  Current Outpatient Prescriptions  Medication Sig Dispense Refill  . acetaminophen (TYLENOL) 500 MG tablet Take 1,000 mg by mouth every 6 (six) hours as needed (pain).    . darbepoetin (ARANESP, ALBUMIN FREE,) 300 MCG/0.6ML SOLN injection Inject 300 mcg into the skin every 30 (thirty) days.    Marland Kitchen diltiazem (DILACOR XR) 180 MG 24 hr capsule Take 180 mg by mouth daily.    Marland Kitchen FeFum-FePoly-FA-B Cmp-C-Biot (INTEGRA PLUS PO) Take 1 tablet by mouth daily.      . furosemide (LASIX) 20 MG tablet Take 20 mg by mouth daily as needed for fluid.    . hydrALAZINE (APRESOLINE) 25 MG tablet Take 1 tablet (25 mg total) by mouth 3 (three) times daily. <MUST KEEP APPOINTMENT 08/21/14> 90 tablet 0  . Olmesartan-Amlodipine-HCTZ (TRIBENZOR) 40-5-12.5 MG TABS Take 1 tablet by mouth daily.    . traMADol (ULTRAM) 50 MG tablet Take 1 tablet (50 mg total) by mouth every 6 (six) hours as needed for moderate pain. 20 tablet 0  . trolamine salicylate (ASPERCREME) 10 % cream Apply 1 application topically as needed for muscle pain.     No current facility-administered medications for this visit.    No Known Allergies  History    Social History  . Marital Status: Widowed    Spouse Name: N/A  . Number of Children: 0  . Years of Education: N/A   Occupational History  .     Social History Main Topics  . Smoking status: Current Some Day Smoker -- 0.25 packs/day    Types: Cigarettes  . Smokeless tobacco: Never Used     Comment: smokes 2-3 cigs daily  . Alcohol Use: No     Comment: occ  . Drug Use: No  . Sexual Activity: Not on file   Other Topics Concern  . Not on file   Social History Narrative     Review of Systems: General: negative for chills, fever, night sweats or weight changes.  Cardiovascular: negative for chest pain, dyspnea on exertion, edema, orthopnea, palpitations, paroxysmal nocturnal dyspnea or shortness of breath Dermatological: negative for rash Respiratory: negative for cough or wheezing Urologic: negative for hematuria Abdominal: negative for nausea, vomiting, diarrhea, bright red blood per rectum, melena, or hematemesis Neurologic: negative for visual changes, syncope, or dizziness All other systems reviewed and are otherwise negative except as noted above.    Blood pressure 126/64, pulse 107, height 5\' 2"  (1.575 m), weight 126 lb 1.6 oz (57.199 kg).  General appearance: alert and no distress Neck: no adenopathy, no JVD, supple, symmetrical, trachea midline, thyroid not enlarged, symmetric, no tenderness/mass/nodules and left carotid bruit Lungs: clear to auscultation bilaterally Heart: irregularly irregular rhythm Extremities: extremities normal, atraumatic, no cyanosis or edema  EKG a total fibrillation with a ventricular response of 107 and nonspecific ST and T-wave changes. I personally reviewed this EKG  ASSESSMENT AND PLAN:   PVD (peripheral vascular disease) History of peripheral vascular disease with claudication followed by Dr. [name]   Essential hypertension History of hypertension blood pressure measured at 126/64. She is on olmesartan, amlodipine,  hydrochlorothiazide, diltiazem and hydralazine. Continue current meds at current dosing   CAROTID ARTERY DISEASE Left carotid bruit with minimal changes on recent carotid duplex which was ordered by Dr. [name].   FIBRILLATION, ATRIAL History of chronic atrial fibrillation rate controlled on diltiazem. She was on oral anticoagulation years ago which was discontinued by Dr. [name], her cardiologist at that time because of melena.   HYPERLIPIDEMIA History of hyperlipidemia currently not on statin therapy followed by her PCP       [name] MD [facility], [facility] 08/21/2014 10:31 AM

## 2014-08-21 NOTE — Assessment & Plan Note (Signed)
History of hyperlipidemia currently not on statin therapy followed by her PCP

## 2014-08-21 NOTE — Assessment & Plan Note (Signed)
History of peripheral vascular disease with claudication followed by Dr. [name]

## 2014-08-21 NOTE — Assessment & Plan Note (Signed)
History of chronic atrial fibrillation rate controlled on diltiazem. She was on oral anticoagulation years ago which was discontinued by Dr. Rex [name], her cardiologist at that time because of melena.

## 2014-08-30 ENCOUNTER — Other Ambulatory Visit: Payer: Self-pay | Admitting: Cardiovascular Disease

## 2014-08-30 NOTE — Telephone Encounter (Signed)
Rx(s) sent to pharmacy electronically.  

## 2014-09-18 ENCOUNTER — Encounter: Payer: Self-pay | Admitting: Vascular Surgery

## 2014-09-19 ENCOUNTER — Ambulatory Visit (HOSPITAL_COMMUNITY)
Admission: RE | Admit: 2014-09-19 | Discharge: 2014-09-19 | Disposition: A | Discharge status: Home / Self Care | Payer: Medicare Other | Source: Ambulatory Visit | Attending: Vascular Surgery | Admitting: Vascular Surgery

## 2014-09-19 ENCOUNTER — Ambulatory Visit (INDEPENDENT_AMBULATORY_CARE_PROVIDER_SITE_OTHER): Payer: Medicare Other | Admitting: Vascular Surgery

## 2014-09-19 ENCOUNTER — Encounter: Payer: Self-pay | Admitting: Vascular Surgery

## 2014-09-19 VITALS — BP 112/62 | HR 86 | Temp 98.6°F | Resp 14 | Ht 62.0 in | Wt 126.0 lb

## 2014-09-19 DIAGNOSIS — I739 Peripheral vascular disease, unspecified: Secondary | ICD-10-CM | POA: Diagnosis not present

## 2014-09-19 DIAGNOSIS — Z01810 Encounter for preprocedural cardiovascular examination: Secondary | ICD-10-CM | POA: Diagnosis not present

## 2014-09-19 NOTE — Progress Notes (Signed)
Established Intermittent Claudication  History of Present Illness  Pam [name] is a 81 y.o. (1925-06-09) female who presents with chief complaint: Bilateral symptoms of claudication.   The patient develops pain in the back of both legs especially calf after walking up 2 or 3 aisles in a grocery store. She denies rest pain. She has no history of nonhealing ulcers on the feet. She does currently smoke 2-3 cigarettes per day. She previously had ABIs performed in 2010 which are 0.45 on the right 0.65 on the left. She recently had additional ABIs performed today [facility] which were 0.3 on the right 0.6 on the left. She had an aorto iliac duplex several weeks ago due to prior history of ectatic aorta. This showed a normal diameter aorta but also showed a short segment left common iliac occlusion with monophasic flow in the left external iliac. She also had a stenosis of the right common iliac artery.  She does have chronic back pain and a history of spinal stenosis. Other medical problems include atrial fibrillation, chronic kidney disease, hypertension, coronary artery disease of which are currently stable. Previous carotid duplex showed less than 50% stenosis bilaterally in 2013.   She had an arteriogram performed in December which showed a left common external and internal iliac artery occlusion. She also had subtotal occlusion of the right external iliac artery with a moderate 50-60% right common iliac artery stenosis. The runoff was not performed secondary to elevated creatinine. At that time I offered the patient possible angioplasty or stenting of the external iliac artery and then possible femorofemoral bypass. She deferred at that time but now wishes to consider operation.   On 08/16/2014 pre-procedure angiogram her creatinine was noted to be elevated at > 2. This is increased from December. She currently does not have limb threatening ischemia and I do not believe benefit of addressing  her PAD today outweighs risk of contrast nephropathy. Procedure will be cancelled.  She is here today for follow up ABI's ROS she reports no changes from her previous visit on 08/01/2014.    Physical Examination  Filed Vitals:   09/19/14 1236  BP: 112/62  Pulse: 86  Temp: 98.6 F (37 C)  TempSrc: Oral  Resp: 14  Height: 5\' 2"  (1.575 m)  Weight: 126 lb (57.153 kg)  SpO2: 95%   Body mass index is 23.04 kg/(m^2).  General: A&O x 3, WD  Pulmonary: Sym exp, good air movt, CTAB, no rales, rhonchi, & wheezing  Cardiac Irregularly, irregular rhythm and rate,  Vascular: Vessel Right Left  Radial Palpable Palpable  Brachial Palpable Palpable  Carotid Palpable, without bruit Palpable, without bruit  Aorta Not palpable N/A  Femoral notPalpable notPalpable  Popliteal Not palpable Not palpable  PT notPalpable notPalpable  DP notPalpable notPalpable   Gastrointestinal: soft, NTND   Musculoskeletal: M/S 5/5 throughout , Extremities without ischemic changes  Neurologic: Pain and light touch intact in  extremities  Non-Invasive Vascular Imaging ABI (Date: 09/19/2014)  R: 0.21 with DP monophasic flow  L: 0.50 with PT/DP monophasic flow   Medical Decision Making  [name] is a 81 y.o. female who presents with: PAD bilateral with claudication symptoms on and off.  She reports no rest pain and she has no current or history of non healing ulcers. CKD with a CR last reported 08/01/2014 or 2.0.  At this time we will continue to encourage her to walk as tolerates daily Practice continued smoking cessation She is not a  candidate for angiogram due to her kidney function it may push her in to ESRD.  We do not recommend by pass surgical intervention unless she develops a non healing ulcer or has rest pain that wakes her up from sleep.    She will follow up in 6 months for repeat ABI's.  If she develops rest pain or an ulcer she will call us sooner.    We also recommend she ask  her primary Physician to follow her kidney function.         Theda Sers Kashis Penley [facility] PA-C [facility] [place] Office: [phone]   09/19/2014, 1:14 PM    History and exam findings as above. The patient has bilateral iliac artery occlusive disease. She also has underlying renal dysfunction. She has not a candidate for further percutaneous revascularization attempts at this point due to her underlying renal dysfunction. She has fairly mild limiting claudication symptoms at this time. She does not have rest pain. She does not have tissue loss. If she progresses to critical ischemia of rest pain or tissue loss. We would need to consider an axillary bifemoral bypass. This would not necessarily be of long-term patency. However, she would not be a candidate for aortobifemoral bypass due to her age and multiple comorbidities. I would not consider further arteriograms on her unless her renal function improves significantly. This is due to the fact that she would be at high risk for contrast nephropathy and permanent dialysis. I had a lengthy discussion today with the patient and her daughter regarding risk-benefit ratios versus revascularization versus conservative management. I believe the best course of action for now as continued observation. She will follow-up with [place] in 6 months time. Aggressive ischemia we would reevaluate her sooner.

## 2014-09-23 NOTE — Addendum Note (Signed)
Addended by: Mena Goes on: 09/23/2014 02:50 PM   Modules accepted: Orders

## 2014-11-14 ENCOUNTER — Encounter (HOSPITAL_COMMUNITY): Payer: Medicare Other

## 2014-11-14 ENCOUNTER — Ambulatory Visit: Payer: Medicare Other | Admitting: Vascular Surgery

## 2015-01-17 ENCOUNTER — Other Ambulatory Visit: Payer: Self-pay | Admitting: Cardiovascular Disease

## 2015-01-17 MED ORDER — HYDRALAZINE HCL 25 MG PO TABS: 25.0000 mg | ORAL_TABLET | Freq: Three times a day (TID) | ORAL | Status: DC

## 2015-01-17 NOTE — Telephone Encounter (Signed)
°  1. Which medications need to be refilled? Hydralazine 25 mg   2. Which pharmacy is medication to be sent to? [facility] on [address]   3. Do they need a 30 day or 90 day supply? 90  4. Would they like a call back once the medication has been sent to the pharmacy? Yes, she took her last pill today

## 2015-01-17 NOTE — Telephone Encounter (Signed)
Rx(s) sent to pharmacy electronically.  

## 2015-01-24 NOTE — Progress Notes (Signed)
This encounter was created in error - please disregard.

## 2015-02-17 ENCOUNTER — Other Ambulatory Visit: Payer: Self-pay | Admitting: Internal Medicine

## 2015-02-17 DIAGNOSIS — N184 Chronic kidney disease, stage 4 (severe): Secondary | ICD-10-CM

## 2015-02-17 DIAGNOSIS — I10 Essential (primary) hypertension: Secondary | ICD-10-CM

## 2015-03-05 ENCOUNTER — Encounter: Payer: Self-pay | Admitting: Gastroenterology

## 2015-03-27 ENCOUNTER — Ambulatory Visit: Payer: Medicare Other | Admitting: Vascular Surgery

## 2015-03-27 ENCOUNTER — Encounter (HOSPITAL_COMMUNITY): Payer: Medicare Other

## 2015-05-02 ENCOUNTER — Encounter: Payer: Self-pay | Admitting: Vascular Surgery

## 2015-05-08 ENCOUNTER — Ambulatory Visit (INDEPENDENT_AMBULATORY_CARE_PROVIDER_SITE_OTHER): Payer: Medicare Other | Admitting: Vascular Surgery

## 2015-05-08 ENCOUNTER — Other Ambulatory Visit: Payer: Self-pay | Admitting: Vascular Surgery

## 2015-05-08 ENCOUNTER — Encounter: Payer: Self-pay | Admitting: Vascular Surgery

## 2015-05-08 ENCOUNTER — Ambulatory Visit (HOSPITAL_COMMUNITY)
Admission: RE | Admit: 2015-05-08 | Discharge: 2015-05-08 | Disposition: A | Discharge status: Home / Self Care | Payer: Medicare Other | Source: Ambulatory Visit | Attending: Vascular Surgery | Admitting: Vascular Surgery

## 2015-05-08 VITALS — BP 149/91 | HR 103 | Temp 98.7°F | Resp 16 | Ht 62.0 in | Wt 126.0 lb

## 2015-05-08 DIAGNOSIS — I739 Peripheral vascular disease, unspecified: Secondary | ICD-10-CM

## 2015-05-08 NOTE — Progress Notes (Signed)
Filed Vitals:   05/08/15 1353 05/08/15 1400  BP: 156/78 149/91  Pulse: 100 103  Temp: 98.7 F (37.1 C)   TempSrc: Oral   Resp: 16   Height: 5\' 2"  (1.575 m)   Weight: 126 lb (57.153 kg)   SpO2: 99%

## 2015-05-08 NOTE — Progress Notes (Signed)
HISTORY AND PHYSICAL     CC:  Follow up ABI's for bilateral iliac artery occlusive dz Referring Provider:  [name], MD  HPI: This is a 81 y.o. female who is here for her 6 month follow up for bilateral iliac artery occlusive dz.  She states that she does have pain in her legs, but this comes and goes and does not appear to be related to her walking.  She states that she does what she wants to do.  She walks in the grocery store without problems as well as the mailbox.  She denies any non healing wounds.    She does have a hx of renal insufficiency with a creatinine of 2.1 in March 2016.   She states that she does not see a kidney doctor as she thinks the one she was seeing died.    Past Medical History  Diagnosis Date  . Anemia   . Personal history of colonic polyps 05/01/2001    hyperplastic  . Hypoparathyroidism ([place])   . Diverticulosis   . Vitamin D deficiency   . Spinal stenosis   . Osteoporosis   . Chronic kidney disease   . Atrial fibrillation ([place])   . Hypercholesterolemia   . Hypertension   . GERD (gastroesophageal reflux disease)   . CAD (coronary artery disease)   . Carotid bruit     bilateral  . Ankle edema   . Peripheral arterial disease [facility])     Past Surgical History  Procedure Laterality Date  . S/p removal of bilateral kidney stones    . Abdominal hysterectomy    . Colonoscopy    . Polypectomy    . Cardioversion  01/04/2003  . Transthoracic echocardiogram  03/2004    EF normal; RA mod-severely dilated, LA mod dilated; mild MR, mild TR; aortic valve mildly sclerotic; mild pulm valve regurg  . Carotid duplex  01/10/2012    less than 50% bilat disease  . Abdominal aortagram N/A 05/03/2014    Procedure: ABDOMINAL Maxcine Ham;  Surgeon: Elam Dutch, MD;  Location: [facility] CATH LAB;  Service: Cardiovascular;  Laterality: N/A;    No Known Allergies  Current Outpatient Prescriptions  Medication Sig Dispense Refill  . acetaminophen (TYLENOL) 500 MG tablet  Take 1,000 mg by mouth every 6 (six) hours as needed (pain).    . darbepoetin (ARANESP, ALBUMIN FREE,) 300 MCG/0.6ML SOLN injection Inject 300 mcg into the skin every 30 (thirty) days.    Marland Kitchen diltiazem (DILACOR XR) 180 MG 24 hr capsule Take 180 mg by mouth daily.    Marland Kitchen FeFum-FePoly-FA-B Cmp-C-Biot (INTEGRA PLUS PO) Take 1 tablet by mouth daily.      . furosemide (LASIX) 20 MG tablet Take 20 mg by mouth daily as needed for fluid.    . hydrALAZINE (APRESOLINE) 25 MG tablet Take 1 tablet (25 mg total) by mouth 3 (three) times daily. 270 tablet 2  . hydrochlorothiazide (MICROZIDE) 12.5 MG capsule Take 12.5 mg by mouth daily.    Marland Kitchen lisinopril (PRINIVIL,ZESTRIL) 40 MG tablet Take 40 mg by mouth daily.    . traMADol (ULTRAM) 50 MG tablet Take 1 tablet (50 mg total) by mouth every 6 (six) hours as needed for moderate pain. 20 tablet 0  . trolamine salicylate (ASPERCREME) 10 % cream Apply 1 application topically as needed for muscle pain.     No current facility-administered medications for this visit.    Family History  Problem Relation Age of Onset  . Heart disease Mother   .  Heart disease Father   . Colon cancer Neg Hx     Social History   Social History  . Marital Status: Widowed    Spouse Name: N/A  . Number of Children: 0  . Years of Education: N/A   Occupational History  .     Social History Main Topics  . Smoking status: Former Smoker    Types: Cigarettes    Quit date: 08/01/2014  . Smokeless tobacco: Never Used     Comment: smokes 2-3 cigs daily  . Alcohol Use: No     Comment: occ  . Drug Use: No  . Sexual Activity: Not on file   Other Topics Concern  . Not on file   Social History Narrative     ROS: [x]  Positive   [ ]  Negative   [ ]  All sytems reviewed and are negative  Cardiovascular: []  chest pain/pressure []  palpitations []  SOB lying flat []  DOE [x]  pain in legs while walking []  pain in feet when lying flat []  hx of DVT []  hx of phlebitis []  swelling in  legs []  varicose veins  Pulmonary: []  productive cough []  asthma []  wheezing  Neurologic: []  weakness in []  arms []  legs []  numbness in []  arms []  legs [] difficulty speaking or slurred speech []  temporary loss of vision in one eye []  dizziness  Hematologic: []  bleeding problems []  problems with blood clotting easily  GI []  vomiting blood []  blood in stool  GU: []  burning with urination []  blood in urine  Psychiatric: []  hx of major depression  Integumentary: []  rashes []  ulcers  Constitutional: []  fever []  chills   PHYSICAL EXAMINATION:  Filed Vitals:   05/08/15 1353 05/08/15 1400  BP: 156/78 149/91  Pulse: 100 103  Temp: 98.7 F (37.1 C)   Resp: 16    Body mass index is 23.04 kg/(m^2).  General:  WDWN in NAD Gait: Not observed HENT: WNL, normocephalic Pulmonary: normal non-labored breathing , without Rales, rhonchi,  wheezing Cardiac: RRR, without  Murmurs, rubs or gallops; without carotid bruits Abdomen: soft, NT, no masses Skin: without rashes Vascular Exam/Pulses:  Right Left  Radial 2+ (normal) 2+ (normal)  Femoral absent absent  Popliteal absent absent  DP absent absent  PT absent absent   Extremities: without ischemic changes, without Gangrene , without cellulitis; without open wounds; mild ankle edema bilaterally Musculoskeletal: no muscle wasting or atrophy  Neurologic: A&O X 3; Appropriate Affect ; SENSATION: normal; MOTOR FUNCTION:  moving all extremities equally. Speech is fluent/normal   Non-Invasive Vascular Imaging:   ABI's 05/08/15: Right:  0.49 Left:  0.38  TBI's 05/08/15: Right:  0.44 Left:  0.0  Previous ABI's 09/19/14: Right:  0.21 Left:  0.50  Pt meds includes: Statin:  No. Beta Blocker:  No. Aspirin:  No. ACEI:  Yes.   ARB:  No. Other Antiplatelet/Anticoagulant:  No.    ASSESSMENT/PLAN:: 81 y.o. female with bilateral iliac occlusive disease    -the pt continues her ADL's without significant claudication  symptoms.  She does not have any non healing wounds. -given that her creatinine in March was 2.2, an arteriogram was deferred due to her CKD. -encouraged her to continue a walking program -will f/u in 6 months for ABI's.  She will return sooner if she develops rest pain or if she develops a non healing wound. -follow up with PCP for her renal function/medication re-evaluation -she does have a hx of left carotid bruit-she did have a carotid duplex in March, which  revealed less than 40% stenosis bilaterally   Leontine Locket, PA-C [facility] [phone]  Clinic MD:  Pt seen and examined in conjunction with Dr. [name]  History and exam details as above. Patient is able to do her daily activities without really any significant claudication symptoms limiting her. She is able to get L LOC she is able to do her shopping she occasionally has some leg pain but no real rest pain no nonhealing wounds. She will follow-up with [place] in 6 months time with repeat ABIs.  [name], MD [facility] [place] Office: [phone] Pager: [phone]

## 2015-05-08 NOTE — Addendum Note (Signed)
Addended by: Reola Calkins on: 05/08/2015 03:59 PM   Modules accepted: Orders

## 2015-05-19 ENCOUNTER — Encounter: Payer: Self-pay | Admitting: Cardiovascular Disease

## 2015-05-19 ENCOUNTER — Ambulatory Visit
Admission: RE | Admit: 2015-05-19 | Discharge: 2015-05-19 | Disposition: A | Discharge status: Home / Self Care | Payer: Medicare Other | Source: Ambulatory Visit | Attending: Internal Medicine | Admitting: Internal Medicine

## 2015-05-19 DIAGNOSIS — N184 Chronic kidney disease, stage 4 (severe): Secondary | ICD-10-CM

## 2015-05-19 DIAGNOSIS — I10 Essential (primary) hypertension: Secondary | ICD-10-CM

## 2015-07-28 ENCOUNTER — Telehealth: Payer: Self-pay | Admitting: *Deleted

## 2015-07-28 NOTE — Telephone Encounter (Signed)
Pam [name] called to triage wanting to move up her appt that is currently scheduled in June to see Dr. [name]. She is having BLE swelling, occasional pain in her legs. She states that" her legs just feel different" and she wants to see Dr. [name]. She is having no CP, SOB, fever or non-healing wounds at this time. Will have her appt moved up.

## 2015-07-29 ENCOUNTER — Telehealth: Payer: Self-pay

## 2015-07-29 NOTE — Telephone Encounter (Signed)
Pt. Returned call to office.  Asked what she can do for the swelling in her feet and legs.  Reported the swelling in from feet to knees, and equal bilaterally.  Denied any redness or warmth in lower legs.  Denied any skin breakdown of feet/ legs.   Stated she was on fluid pills at one time, but is not taking them now.  Denied any SOB, Cough, or Chest pain.  Questioned if her PCP was aware of the swelling in her legs?  Stated she is planning to call him.  Reported she has been soaking feet and legs in Epsom Salts, because someone told her to try that.  Has appt. scheduled for ABI's, and with Dr. [name] on 08/28/15.  Encouraged to elevate feet and legs at least 4 x/day, above level of heart, to help reduce swelling.  Advised to report swelling to her PCP also, in the event he may want to adjust her medication.  Also advised pt. about watching Sodium intake in her food.  Pt. verb. understanding.  Reported she will call her PCP.

## 2015-07-29 NOTE — Telephone Encounter (Signed)
Attempted to call pt. Back following voice message that she wanted to speak to a nurse.  Left v.m. To call back to office.

## 2015-07-29 NOTE — Telephone Encounter (Signed)
Spoke with pt re appt- she asked to again speak with the RN about her swelling, dpm

## 2015-08-20 ENCOUNTER — Encounter: Payer: Self-pay | Admitting: Vascular Surgery

## 2015-08-28 ENCOUNTER — Inpatient Hospital Stay (INDEPENDENT_AMBULATORY_CARE_PROVIDER_SITE_OTHER)
Admission: RE | Admit: 2015-08-28 | Discharge: 2015-08-28 | Disposition: A | Discharge status: Home / Self Care | Payer: Medicare Other | Source: Ambulatory Visit | Attending: Vascular Surgery | Admitting: Vascular Surgery

## 2015-08-28 ENCOUNTER — Ambulatory Visit: Payer: Medicare Other | Admitting: Vascular Surgery

## 2015-08-28 DIAGNOSIS — I739 Peripheral vascular disease, unspecified: Secondary | ICD-10-CM | POA: Diagnosis not present

## 2015-11-06 ENCOUNTER — Encounter (HOSPITAL_COMMUNITY): Payer: Medicare Other

## 2015-11-06 ENCOUNTER — Ambulatory Visit: Payer: Medicare Other | Admitting: Vascular Surgery

## 2016-01-12 ENCOUNTER — Other Ambulatory Visit: Payer: Self-pay | Admitting: Cardiovascular Disease

## 2016-01-23 ENCOUNTER — Telehealth: Payer: Self-pay | Admitting: Cardiovascular Disease

## 2016-01-23 MED ORDER — HYDRALAZINE HCL 25 MG PO TABS: 25.0000 mg | ORAL_TABLET | Freq: Three times a day (TID) | ORAL | 0 refills | Status: DC

## 2016-01-23 NOTE — Telephone Encounter (Signed)
New message       *STAT* If patient is at the pharmacy, call can be transferred to refill team.   1. Which medications need to be refilled? (please list name of each medication and dose if known)  Hydralazine 25mg   2. Which pharmacy/location (including street and city if local pharmacy) is medication to be sent to? [email or web address]  3. Do they need a 30 day or 90 day supply? 90 day  Pt has been out of medication for 2 dayscv

## 2016-01-23 NOTE — Telephone Encounter (Signed)
Rx(s) sent to pharmacy electronically.  

## 2016-01-23 NOTE — Telephone Encounter (Signed)
Pam [name] is calling again,pt still have not received her medicine. Please do this asap please. Pt did without her medicine yesterday and have missed some today.

## 2016-03-11 ENCOUNTER — Other Ambulatory Visit: Payer: Self-pay | Admitting: Cardiovascular Disease

## 2016-06-28 ENCOUNTER — Telehealth: Payer: Self-pay

## 2016-06-28 DIAGNOSIS — I739 Peripheral vascular disease, unspecified: Secondary | ICD-10-CM

## 2016-06-28 DIAGNOSIS — I70213 Atherosclerosis of native arteries of extremities with intermittent claudication, bilateral legs: Secondary | ICD-10-CM

## 2016-06-28 NOTE — Telephone Encounter (Signed)
Phone call from pt.  Reported swelling in both feet.  C/o bilat. legs hurt with walking, and at rest.  Stated the bottom of her left foot "is sore to walk on"; denied any open sores or break in skin on sole of left foot.  Also, denied any open sores bilat LE's.  Reported the swelling of her feet fluctuates.  Stated "there is redness on top of both feet, and they are a little dark."  Also, stated that she thinks the discomfort in her legs, and left foot has been present 3-4 days.  Advised will have a Scheduler contact her with an appt. for evaluation.  Agreed with plan.

## 2016-06-29 ENCOUNTER — Encounter: Payer: Self-pay | Admitting: Family

## 2016-06-30 ENCOUNTER — Encounter: Payer: Self-pay | Admitting: Family

## 2016-06-30 ENCOUNTER — Ambulatory Visit (INDEPENDENT_AMBULATORY_CARE_PROVIDER_SITE_OTHER): Payer: Medicare Other | Admitting: Family

## 2016-06-30 ENCOUNTER — Ambulatory Visit (HOSPITAL_COMMUNITY)
Admission: RE | Admit: 2016-06-30 | Discharge: 2016-06-30 | Disposition: A | Discharge status: Home / Self Care | Payer: Medicare Other | Source: Ambulatory Visit | Attending: Vascular Surgery | Admitting: Vascular Surgery

## 2016-06-30 VITALS — BP 138/72 | HR 94 | Temp 97.6°F | Resp 16 | Ht 62.0 in | Wt 123.0 lb

## 2016-06-30 DIAGNOSIS — M79672 Pain in left foot: Secondary | ICD-10-CM | POA: Diagnosis not present

## 2016-06-30 DIAGNOSIS — I70213 Atherosclerosis of native arteries of extremities with intermittent claudication, bilateral legs: Secondary | ICD-10-CM | POA: Diagnosis present

## 2016-06-30 DIAGNOSIS — I739 Peripheral vascular disease, unspecified: Secondary | ICD-10-CM

## 2016-06-30 DIAGNOSIS — R9439 Abnormal result of other cardiovascular function study: Secondary | ICD-10-CM | POA: Insufficient documentation

## 2016-06-30 DIAGNOSIS — I7409 Other arterial embolism and thrombosis of abdominal aorta: Secondary | ICD-10-CM

## 2016-06-30 NOTE — Patient Instructions (Signed)

## 2016-06-30 NOTE — Progress Notes (Signed)
[facility] [place]   CC: Follow up peripheral artery occlusive disease  History of Present Illness Pam [name] is a 83 y.o. female patient of Dr. [name] with a history of bilateral iliac artery occlusive disease. She was last evaluated in our practice on 05-08-2015 by Dr. [name] and S. Rhyne PA-C. At that time the pt continued her ADL's without significant claudication symptoms.  She did not have any non healing wounds. -given that her creatinine in March 2016 was 2.2, an arteriogram was deferred due to her CKD. -encouraged her to continue a walking program -pt advised to f/u in 6 months for ABI's and advised to return sooner if she develops rest pain or if she develops a non healing wound. - pt was advised to follow up with PCP for her renal function/medication re-evaluation -she has a hx of left carotid bruit-she did have a carotid duplex in March 2016, which revealed less than 40% stenosis bilaterally  She is able to do her shopping; she occasionally has some leg pain but no real rest pain.  She returns today after a phone call from pt on 06-28-16. Reported swelling in both feet.  C/o bilat. legs hurt with walking, and at rest.  Stated the bottom of her left foot "is sore to walk on"; denied any open sores or break in skin on sole of left foot.  Also, denied any open sores bilat LE's.  Reported the swelling of her feet fluctuates.  Stated "there is redness on top of both feet, and they are a little dark."  Also, stated that she thinks the discomfort in her legs, and left foot has been present 3-4 days. She was apparently lost to follow up until today with the above complaint.   Dr. Gwenlyn Found is her cardiologist. Dr. [name] is her PCP. She states she does not have a nephrologist.  Most recent serum creatinine result on file is 1.98 on 05-09-2015  Pt Diabetic: No Pt smoker: former smoker, quit in 2016  Pt meds include: Statin :No Betablocker: No ASA: No Other  anticoagulants/antiplatelets: no  Past Medical History:  Diagnosis Date  . Anemia   . Ankle edema   . Atrial fibrillation ([place])   . CAD (coronary artery disease)   . Carotid bruit    bilateral  . Chronic kidney disease   . Diverticulosis   . GERD (gastroesophageal reflux disease)   . Hypercholesterolemia   . Hypertension   . Hypoparathyroidism ([place])   . Osteoporosis   . Peripheral arterial disease ([place])   . Personal history of colonic polyps 05/01/2001   hyperplastic  . Spinal stenosis   . Vitamin D deficiency     Social History Social History  Substance Use Topics  . Smoking status: Former Smoker    Types: Cigarettes    Quit date: 08/01/2014  . Smokeless tobacco: Never Used     Comment: smokes 2-3 cigs daily  . Alcohol use No     Comment: occ    Family History Family History  Problem Relation Age of Onset  . Heart disease Mother   . Heart disease Father   . Colon cancer Neg Hx     Past Surgical History:  Procedure Laterality Date  . ABDOMINAL AORTAGRAM N/A 05/03/2014   Procedure: ABDOMINAL Maxcine Ham;  Surgeon: Elam Dutch, MD;  Location: [facility] CATH LAB;  Service: Cardiovascular;  Laterality: N/A;  . ABDOMINAL HYSTERECTOMY    . CARDIOVERSION  01/04/2003  . carotid duplex  01/10/2012   less than 50% bilat disease  . COLONOSCOPY    . POLYPECTOMY    . S/p Removal of bilateral kidney stones    . TRANSTHORACIC ECHOCARDIOGRAM  03/2004   EF normal; RA mod-severely dilated, LA mod dilated; mild MR, mild TR; aortic valve mildly sclerotic; mild pulm valve regurg    No Known Allergies  Current Outpatient Prescriptions  Medication Sig Dispense Refill  . acetaminophen (TYLENOL) 500 MG tablet Take 1,000 mg by mouth every 6 (six) hours as needed (pain).    Marland Kitchen diltiazem (DILACOR XR) 180 MG 24 hr capsule Take 180 mg by mouth daily.    Marland Kitchen FeFum-FePoly-FA-B Cmp-C-Biot (INTEGRA PLUS PO) Take 1 tablet by mouth daily.      . hydrALAZINE (APRESOLINE) 25 MG tablet take 1 tablet by  mouth three times a day 90 tablet 1  . lisinopril (PRINIVIL,ZESTRIL) 40 MG tablet Take 40 mg by mouth daily.    . metoprolol tartrate (LOPRESSOR) 25 MG tablet     . torsemide (DEMADEX) 10 MG tablet     . traMADol (ULTRAM) 50 MG tablet Take 1 tablet (50 mg total) by mouth every 6 (six) hours as needed for moderate pain. 20 tablet 0  . trolamine salicylate (ASPERCREME) 10 % cream Apply 1 application topically as needed for muscle pain.    . darbepoetin (ARANESP, ALBUMIN FREE,) 300 MCG/0.6ML SOLN injection Inject 300 mcg into the skin every 30 (thirty) days.    . furosemide (LASIX) 20 MG tablet Take 20 mg by mouth daily as needed for fluid.    . hydrochlorothiazide (MICROZIDE) 12.5 MG capsule Take 12.5 mg by mouth daily.     No current facility-administered medications for this visit.     ROS: See HPI for pertinent positives and negatives.   Physical Examination  Vitals:   06/30/16 1003  BP: 138/72  Pulse: 94  Resp: 16  Temp: 97.6 F (36.4 C)  TempSrc: Oral  SpO2: 96%  Weight: 123 lb (55.8 kg)  Height: 5\' 2"  (1.575 m)   Body mass index is 22.5 kg/m.  General: A&O x 3, WDWN, elderly female. Gait: limp, antalgic Eyes: PERRLA. Pulmonary: Respirations are non labored, CTAB, good air movement Cardiac: Irregular rhythm, no detected murmur.         Carotid Bruits Right Left   Negative Negative  Aorta is not palpable. Radial pulses: 1+ palpable and =                           VASCULAR EXAM: Extremities with ischemic changes: all toes of both feet are dark at the dorsal aspects, left more so than right, without Gangrene; without open wounds. Dorsal aspect of left foot with 1-2+ pitting edema and mild/moderate erythema. Left heel is painful to touch and to weight bear.                                                                                                          LE Pulses Right Left  FEMORAL  not palpable  not palpable        POPLITEAL  not palpable   not  palpable       POSTERIOR TIBIAL  not palpable   not palpable        DORSALIS PEDIS      ANTERIOR TIBIAL not palpable  not palpable    Abdomen: soft, NT, no palpable masses. Skin: no rashes, no ulcers noted, see Extremities. Musculoskeletal: no muscle wasting or atrophy.  Neurologic: A&O X 3; Appropriate Affect ; SENSATION: normal; MOTOR FUNCTION:  moving all extremities equally, motor strength 4/5 throughout. Speech is fluent/normal. CN 2-12 intact.    ASSESSMENT: Pam [name] is a 83 y.o. female who presents with: bilateral iliac artery occlusive disease.  She reports about 1 week history of left heel constant pain, worse with weight bearing. Also has swelling with mild/moderate erythema at dorsal aspect left foot. There are no open wounds, no ulcers, no gangrene in her feet/legs.   DATA ABI's: Right: 0.51 DP (monophasic), absent PT (was 0.49 on 05-08-15), TBI: 0.45 (was 0.44) Left: 0.36 DP (monophasic), PT is non compressible (monophasic); TBI: 0.00 (was 0.00). Stable ABI's compared to 05-08-15: moderate arterial occlusive disease in the right LE, severe in the left.   Dr. [name] spoke with and examined pt.   PLAN:  Based on the patient's vascular studies and examination, pt will be scheduled for an arteriogram on Friday July 02, 2016, by Dr. [name]. Pt to be given IV hydration prior to arteriogram. Needs a liter of NS before the arteriogram.  Pt may come in February 1 evening to start this process. She wants to coordinate this with her niece.   I discussed in depth with the patient the nature of atherosclerosis, and emphasized the importance of maximal medical management including strict control of blood pressure, blood glucose, and lipid levels, obtaining regular exercise, and continued cessation of smoking.  The patient is aware that without maximal medical management the underlying atherosclerotic disease process will progress, limiting the benefit of any interventions.  The  patient was given information about PAD including signs, symptoms, treatment, what symptoms should prompt the patient to seek immediate medical care, and risk reduction measures to take.  Clemon Chambers, RN, MSN, FNP-C [facility] Phone: [phone]  Clinic MD: Scot Dock  06/30/16 10:42 AM

## 2016-07-01 ENCOUNTER — Observation Stay (HOSPITAL_COMMUNITY)
Admission: RE | Admit: 2016-07-01 | Discharge: 2016-07-03 | Disposition: A | Discharge status: Home / Self Care | Payer: Medicare Other | Source: Ambulatory Visit | Attending: Vascular Surgery | Admitting: Vascular Surgery

## 2016-07-01 ENCOUNTER — Encounter (HOSPITAL_COMMUNITY): Payer: Self-pay | Admitting: General Practice

## 2016-07-01 ENCOUNTER — Other Ambulatory Visit: Payer: Self-pay | Admitting: Physician Assistant

## 2016-07-01 DIAGNOSIS — K922 Gastrointestinal hemorrhage, unspecified: Secondary | ICD-10-CM | POA: Diagnosis not present

## 2016-07-01 DIAGNOSIS — D631 Anemia in chronic kidney disease: Secondary | ICD-10-CM | POA: Insufficient documentation

## 2016-07-01 DIAGNOSIS — M4800 Spinal stenosis, site unspecified: Secondary | ICD-10-CM | POA: Insufficient documentation

## 2016-07-01 DIAGNOSIS — Z87891 Personal history of nicotine dependence: Secondary | ICD-10-CM | POA: Diagnosis not present

## 2016-07-01 DIAGNOSIS — E559 Vitamin D deficiency, unspecified: Secondary | ICD-10-CM | POA: Diagnosis not present

## 2016-07-01 DIAGNOSIS — I129 Hypertensive chronic kidney disease with stage 1 through stage 4 chronic kidney disease, or unspecified chronic kidney disease: Secondary | ICD-10-CM | POA: Diagnosis not present

## 2016-07-01 DIAGNOSIS — R600 Localized edema: Secondary | ICD-10-CM | POA: Insufficient documentation

## 2016-07-01 DIAGNOSIS — I2510 Atherosclerotic heart disease of native coronary artery without angina pectoris: Secondary | ICD-10-CM | POA: Diagnosis not present

## 2016-07-01 DIAGNOSIS — I739 Peripheral vascular disease, unspecified: Secondary | ICD-10-CM | POA: Diagnosis present

## 2016-07-01 DIAGNOSIS — I7092 Chronic total occlusion of artery of the extremities: Secondary | ICD-10-CM | POA: Diagnosis not present

## 2016-07-01 DIAGNOSIS — I4891 Unspecified atrial fibrillation: Secondary | ICD-10-CM | POA: Insufficient documentation

## 2016-07-01 DIAGNOSIS — K219 Gastro-esophageal reflux disease without esophagitis: Secondary | ICD-10-CM | POA: Insufficient documentation

## 2016-07-01 DIAGNOSIS — M810 Age-related osteoporosis without current pathological fracture: Secondary | ICD-10-CM | POA: Diagnosis not present

## 2016-07-01 DIAGNOSIS — Z8249 Family history of ischemic heart disease and other diseases of the circulatory system: Secondary | ICD-10-CM | POA: Insufficient documentation

## 2016-07-01 DIAGNOSIS — N189 Chronic kidney disease, unspecified: Secondary | ICD-10-CM | POA: Diagnosis not present

## 2016-07-01 DIAGNOSIS — I70221 Atherosclerosis of native arteries of extremities with rest pain, right leg: Secondary | ICD-10-CM | POA: Diagnosis present

## 2016-07-01 DIAGNOSIS — E7800 Pure hypercholesterolemia, unspecified: Secondary | ICD-10-CM | POA: Diagnosis not present

## 2016-07-01 HISTORY — DX: Personal history of urinary calculi: Z87.442

## 2016-07-01 HISTORY — DX: Peripheral vascular disease, unspecified: I73.9

## 2016-07-01 HISTORY — DX: Personal history of other medical treatment: Z92.89

## 2016-07-01 LAB — COMPREHENSIVE METABOLIC PANEL
ALT: 13 U/L — ABNORMAL LOW (ref 14–54)
ANION GAP: 9 (ref 5–15)
AST: 23 U/L (ref 15–41)
Albumin: 3.7 g/dL (ref 3.5–5.0)
Alkaline Phosphatase: 75 U/L (ref 38–126)
BUN: 67 mg/dL — ABNORMAL HIGH (ref 6–20)
CHLORIDE: 106 mmol/L (ref 101–111)
CO2: 24 mmol/L (ref 22–32)
CREATININE: 2.61 mg/dL — AB (ref 0.44–1.00)
Calcium: 10.8 mg/dL — ABNORMAL HIGH (ref 8.9–10.3)
GFR, EST AFRICAN AMERICAN: 18 mL/min — AB (ref >60)
GFR, EST NON AFRICAN AMERICAN: 15 mL/min — AB (ref >60)
Glucose, Bld: 83 mg/dL (ref 65–99)
POTASSIUM: 4.5 mmol/L (ref 3.5–5.1)
Sodium: 139 mmol/L (ref 135–145)
Total Bilirubin: 1.1 mg/dL (ref 0.3–1.2)
Total Protein: 7 g/dL (ref 6.5–8.1)

## 2016-07-01 LAB — CBC
HCT: 29.6 % — ABNORMAL LOW (ref 36.0–46.0)
Hemoglobin: 9.9 g/dL — ABNORMAL LOW (ref 12.0–15.0)
MCH: 26.1 pg (ref 26.0–34.0)
MCHC: 33.4 g/dL (ref 30.0–36.0)
MCV: 77.9 fL — AB (ref 78.0–100.0)
PLATELETS: 172 10*3/uL (ref 150–400)
RBC: 3.8 MIL/uL — ABNORMAL LOW (ref 3.87–5.11)
RDW: 13.3 % (ref 11.5–15.5)
WBC: 5.9 10*3/uL (ref 4.0–10.5)

## 2016-07-01 LAB — PROTIME-INR
INR: 1.08
PROTHROMBIN TIME: 14 s (ref 11.4–15.2)

## 2016-07-01 MED ORDER — PANTOPRAZOLE SODIUM 40 MG PO TBEC
40.0000 mg | DELAYED_RELEASE_TABLET | Freq: Every day | ORAL | Status: DC
  Administered 2016-07-01: 40 mg via ORAL
  Filled 2016-07-01: qty 1

## 2016-07-01 MED ORDER — METOPROLOL TARTRATE 25 MG PO TABS
25.0000 mg | ORAL_TABLET | Freq: Two times a day (BID) | ORAL | Status: DC
  Administered 2016-07-02 – 2016-07-03 (×4): 25 mg via ORAL
  Filled 2016-07-01 (×4): qty 1

## 2016-07-01 MED ORDER — HYDROCHLOROTHIAZIDE 12.5 MG PO CAPS
12.5000 mg | ORAL_CAPSULE | Freq: Every day | ORAL | Status: DC
  Administered 2016-07-02 – 2016-07-03 (×2): 12.5 mg via ORAL
  Filled 2016-07-01 (×2): qty 1

## 2016-07-01 MED ORDER — BISACODYL 10 MG RE SUPP: 10.0000 mg | Freq: Every day | RECTAL | Status: DC | PRN

## 2016-07-01 MED ORDER — TORSEMIDE 20 MG PO TABS
10.0000 mg | ORAL_TABLET | Freq: Every day | ORAL | Status: DC
  Administered 2016-07-02 – 2016-07-03 (×2): 10 mg via ORAL
  Filled 2016-07-01 (×2): qty 1

## 2016-07-01 MED ORDER — HYDRALAZINE HCL 25 MG PO TABS
25.0000 mg | ORAL_TABLET | Freq: Three times a day (TID) | ORAL | Status: DC
  Administered 2016-07-01 – 2016-07-03 (×5): 25 mg via ORAL
  Filled 2016-07-01 (×4): qty 1

## 2016-07-01 MED ORDER — PHENOL 1.4 % MT LIQD: 1.0000 | OROMUCOSAL | Status: DC | PRN

## 2016-07-01 MED ORDER — TRAMADOL HCL 50 MG PO TABS: 50.0000 mg | ORAL_TABLET | Freq: Three times a day (TID) | ORAL | Status: DC | PRN

## 2016-07-01 MED ORDER — ACETAMINOPHEN 325 MG RE SUPP: 325.0000 mg | RECTAL | Status: DC | PRN

## 2016-07-01 MED ORDER — MORPHINE SULFATE (PF) 2 MG/ML IV SOLN: 1.0000 mg | INTRAVENOUS | Status: DC | PRN

## 2016-07-01 MED ORDER — HEPARIN SODIUM (PORCINE) 5000 UNIT/ML IJ SOLN
5000.0000 [IU] | Freq: Three times a day (TID) | INTRAMUSCULAR | Status: AC
  Administered 2016-07-01: 5000 [IU] via SUBCUTANEOUS
  Filled 2016-07-01: qty 1

## 2016-07-01 MED ORDER — POLYETHYLENE GLYCOL 3350 17 G PO PACK
17.0000 g | PACK | Freq: Every day | ORAL | Status: DC | PRN
Start: 2016-07-01 — End: 2016-07-02

## 2016-07-01 MED ORDER — ACETAMINOPHEN 325 MG PO TABS: 325.0000 mg | ORAL_TABLET | ORAL | Status: DC | PRN

## 2016-07-01 MED ORDER — ONDANSETRON HCL 4 MG/2ML IJ SOLN: 4.0000 mg | Freq: Four times a day (QID) | INTRAMUSCULAR | Status: DC | PRN

## 2016-07-01 MED ORDER — TROLAMINE SALICYLATE 10 % EX CREA
1.0000 "application " | TOPICAL_CREAM | CUTANEOUS | Status: DC | PRN
  Filled 2016-07-01: qty 85

## 2016-07-01 MED ORDER — METOPROLOL TARTRATE 5 MG/5ML IV SOLN: 2.0000 mg | INTRAVENOUS | Status: DC | PRN

## 2016-07-01 MED ORDER — SODIUM CHLORIDE 0.9 % IV SOLN
INTRAVENOUS | Status: DC
  Administered 2016-07-01: 20:00:00 via INTRAVENOUS

## 2016-07-01 MED ORDER — GUAIFENESIN-DM 100-10 MG/5ML PO SYRP: 15.0000 mL | ORAL_SOLUTION | ORAL | Status: DC | PRN

## 2016-07-01 MED ORDER — HYDRALAZINE HCL 20 MG/ML IJ SOLN: 5.0000 mg | INTRAMUSCULAR | Status: DC | PRN

## 2016-07-02 ENCOUNTER — Encounter (HOSPITAL_COMMUNITY): Payer: Self-pay | Admitting: Vascular Surgery

## 2016-07-02 ENCOUNTER — Encounter (HOSPITAL_COMMUNITY)
Admission: RE | Disposition: A | Discharge status: Home / Self Care | Payer: Self-pay | Source: Ambulatory Visit | Attending: Vascular Surgery

## 2016-07-02 DIAGNOSIS — I70221 Atherosclerosis of native arteries of extremities with rest pain, right leg: Secondary | ICD-10-CM | POA: Diagnosis not present

## 2016-07-02 DIAGNOSIS — I70222 Atherosclerosis of native arteries of extremities with rest pain, left leg: Secondary | ICD-10-CM | POA: Diagnosis not present

## 2016-07-02 HISTORY — PX: PERIPHERAL VASCULAR CATHETERIZATION: SHX172C

## 2016-07-02 LAB — BASIC METABOLIC PANEL
Anion gap: 13 (ref 5–15)
BUN: 65 mg/dL — ABNORMAL HIGH (ref 6–20)
CALCIUM: 10.2 mg/dL (ref 8.9–10.3)
CO2: 20 mmol/L — ABNORMAL LOW (ref 22–32)
Chloride: 104 mmol/L (ref 101–111)
Creatinine, Ser: 2.62 mg/dL — ABNORMAL HIGH (ref 0.44–1.00)
GFR, EST AFRICAN AMERICAN: 18 mL/min — AB (ref >60)
GFR, EST NON AFRICAN AMERICAN: 15 mL/min — AB (ref >60)
GLUCOSE: 83 mg/dL (ref 65–99)
POTASSIUM: 4.4 mmol/L (ref 3.5–5.1)
Sodium: 137 mmol/L (ref 135–145)

## 2016-07-02 LAB — URINALYSIS, ROUTINE W REFLEX MICROSCOPIC
Bilirubin Urine: NEGATIVE
GLUCOSE, UA: NEGATIVE mg/dL
Hgb urine dipstick: NEGATIVE
KETONES UR: NEGATIVE mg/dL
NITRITE: NEGATIVE
PH: 5 (ref 5.0–8.0)
PROTEIN: NEGATIVE mg/dL
Specific Gravity, Urine: 1.01 (ref 1.005–1.030)

## 2016-07-02 LAB — POCT ACTIVATED CLOTTING TIME
ACTIVATED CLOTTING TIME: 197 s
Activated Clotting Time: 213 seconds

## 2016-07-02 LAB — CBC
HEMATOCRIT: 26.6 % — AB (ref 36.0–46.0)
HEMOGLOBIN: 8.8 g/dL — AB (ref 12.0–15.0)
MCH: 25.7 pg — ABNORMAL LOW (ref 26.0–34.0)
MCHC: 33.1 g/dL (ref 30.0–36.0)
MCV: 77.8 fL — ABNORMAL LOW (ref 78.0–100.0)
Platelets: 163 10*3/uL (ref 150–400)
RBC: 3.42 MIL/uL — AB (ref 3.87–5.11)
RDW: 13.1 % (ref 11.5–15.5)
WBC: 5.4 10*3/uL (ref 4.0–10.5)

## 2016-07-02 LAB — URINALYSIS, MICROSCOPIC (REFLEX)

## 2016-07-02 LAB — PROTIME-INR
INR: 1.07
Prothrombin Time: 14 seconds (ref 11.4–15.2)

## 2016-07-02 SURGERY — ABDOMINAL AORTOGRAM
Laterality: Right

## 2016-07-02 MED ORDER — LIDOCAINE HCL (PF) 1 % IJ SOLN
INTRAMUSCULAR | Status: DC | PRN
  Administered 2016-07-02: 12 mL

## 2016-07-02 MED ORDER — SODIUM CHLORIDE 0.45 % IV SOLN
INTRAVENOUS | Status: DC
  Administered 2016-07-02: 13:00:00 via INTRAVENOUS

## 2016-07-02 MED ORDER — HEPARIN SODIUM (PORCINE) 1000 UNIT/ML IJ SOLN
INTRAMUSCULAR | Status: DC | PRN
  Administered 2016-07-02: 6000 [IU] via INTRAVENOUS

## 2016-07-02 MED ORDER — HEPARIN (PORCINE) IN NACL 2-0.9 UNIT/ML-% IJ SOLN
INTRAMUSCULAR | Status: AC
  Filled 2016-07-02: qty 1000

## 2016-07-02 MED ORDER — HEPARIN SODIUM (PORCINE) 1000 UNIT/ML IJ SOLN
INTRAMUSCULAR | Status: AC
  Filled 2016-07-02: qty 1

## 2016-07-02 MED ORDER — PANTOPRAZOLE SODIUM 40 MG PO TBEC
40.0000 mg | DELAYED_RELEASE_TABLET | Freq: Every day | ORAL | Status: DC
  Administered 2016-07-02 – 2016-07-03 (×2): 40 mg via ORAL
  Filled 2016-07-02 (×2): qty 1

## 2016-07-02 MED ORDER — PHENOL 1.4 % MT LIQD: 1.0000 | OROMUCOSAL | Status: DC | PRN

## 2016-07-02 MED ORDER — ALUM & MAG HYDROXIDE-SIMETH 200-200-20 MG/5ML PO SUSP: 15.0000 mL | ORAL | Status: DC | PRN

## 2016-07-02 MED ORDER — HYDRALAZINE HCL 20 MG/ML IJ SOLN: 5.0000 mg | INTRAMUSCULAR | Status: DC | PRN

## 2016-07-02 MED ORDER — ONDANSETRON HCL 4 MG/2ML IJ SOLN
INTRAMUSCULAR | Status: AC
  Filled 2016-07-02: qty 2

## 2016-07-02 MED ORDER — METOPROLOL TARTRATE 5 MG/5ML IV SOLN: 2.0000 mg | INTRAVENOUS | Status: DC | PRN

## 2016-07-02 MED ORDER — IODIXANOL 320 MG/ML IV SOLN
INTRAVENOUS | Status: DC | PRN
Start: 2016-07-02 — End: 2016-07-02
  Administered 2016-07-02: 21 mL via INTRAVENOUS

## 2016-07-02 MED ORDER — ACETAMINOPHEN 325 MG RE SUPP: 325.0000 mg | RECTAL | Status: DC | PRN

## 2016-07-02 MED ORDER — DOCUSATE SODIUM 100 MG PO CAPS
100.0000 mg | ORAL_CAPSULE | Freq: Every day | ORAL | Status: DC
  Administered 2016-07-03: 100 mg via ORAL
  Filled 2016-07-02: qty 1

## 2016-07-02 MED ORDER — LABETALOL HCL 5 MG/ML IV SOLN: 10.0000 mg | INTRAVENOUS | Status: DC | PRN

## 2016-07-02 MED ORDER — ONDANSETRON HCL 4 MG/2ML IJ SOLN
INTRAMUSCULAR | Status: DC | PRN
  Administered 2016-07-02: 4 mg via INTRAVENOUS

## 2016-07-02 MED ORDER — ACETAMINOPHEN 325 MG PO TABS
325.0000 mg | ORAL_TABLET | ORAL | Status: DC | PRN
  Administered 2016-07-02 (×2): 650 mg via ORAL
  Filled 2016-07-02 (×2): qty 2

## 2016-07-02 MED ORDER — GUAIFENESIN-DM 100-10 MG/5ML PO SYRP: 15.0000 mL | ORAL_SOLUTION | ORAL | Status: DC | PRN

## 2016-07-02 MED ORDER — LIDOCAINE HCL (PF) 1 % IJ SOLN
INTRAMUSCULAR | Status: AC
  Filled 2016-07-02: qty 30

## 2016-07-02 MED ORDER — ONDANSETRON HCL 4 MG/2ML IJ SOLN
4.0000 mg | Freq: Four times a day (QID) | INTRAMUSCULAR | Status: DC | PRN
Start: 2016-07-02 — End: 2016-07-03

## 2016-07-02 MED ORDER — HEPARIN (PORCINE) IN NACL 2-0.9 UNIT/ML-% IJ SOLN
INTRAMUSCULAR | Status: DC | PRN
  Administered 2016-07-02: 1000 mL

## 2016-07-02 SURGICAL SUPPLY — 27 items
BALLN ARMADA 4X60X80 (BALLOONS) ×4
BALLN ARMADA 6X60X80 (BALLOONS) ×4
BALLOON ARMADA 4X60X80 (BALLOONS) ×3 IMPLANT
BALLOON ARMADA 6X60X80 (BALLOONS) ×3 IMPLANT
CATH ANGIO 5F BER2 65CM (CATHETERS) ×4 IMPLANT
CATH ANGIO 5F PIGTAIL 65CM (CATHETERS) ×4 IMPLANT
CATH SOFT-VU 4F 65 STRAIGHT (CATHETERS) ×3 IMPLANT
CATH SOFT-VU STRAIGHT 4F 65CM (CATHETERS) ×1
COVER PRB 48X5XTLSCP FOLD TPE (BAG) ×6 IMPLANT
COVER PROBE 5X48 (BAG) ×2
DEVICE CONTINUOUS FLUSH (MISCELLANEOUS) ×4 IMPLANT
FILTER CO2 0.2 MICRON (VASCULAR PRODUCTS) ×4 IMPLANT
GUIDEWIRE ANGLED .035X150CM (WIRE) ×4 IMPLANT
KIT ENCORE 26 ADVANTAGE (KITS) ×4 IMPLANT
KIT PV (KITS) ×4 IMPLANT
RESERVOIR CO2 (VASCULAR PRODUCTS) ×4 IMPLANT
SET FLUSH CO2 (MISCELLANEOUS) ×4 IMPLANT
SHEATH BRITE TIP 7FR 35CM (SHEATH) ×4 IMPLANT
SHEATH PINNACLE 5F 10CM (SHEATH) ×4 IMPLANT
STENT LIFESTREAM 6X37X80 (Permanent Stent) ×4 IMPLANT
STENT LIFESTREAM 8X58X80 (Permanent Stent) ×8 IMPLANT
SYR MEDRAD MARK V 150ML (SYRINGE) ×4 IMPLANT
TRANSDUCER W/STOPCOCK (MISCELLANEOUS) ×4 IMPLANT
TRAY PV CATH (CUSTOM PROCEDURE TRAY) ×4 IMPLANT
WIRE HITORQ VERSACORE ST 145CM (WIRE) ×4 IMPLANT
WIRE MINI STICK MAX (SHEATH) ×4 IMPLANT
WIRE ROSEN-J .035X180CM (WIRE) ×4 IMPLANT

## 2016-07-02 NOTE — CV Procedure (Signed)
Order for sheath removal verified per post procedural orders. Procedure explained to patient and Rt femoral artery access site assessed: level 0,Doppler dorsalis pedis . 72fr [place] Sheath removed and manual pressure applied for 20 minutes. Pre, peri, & post procedural vitals: HR 80, RR 18, O2 Sat upper 97, BP 132/62, Pain 0. Distal pulses remained intact after sheath removal. Access site level 0 and dressed with 4X4 gauze and tegaderm.  Marland Kitchen Post procedural instructions discussed and return demonstration from patient.

## 2016-07-02 NOTE — H&P (View-Only) (Signed)
[facility] [place]   CC: Follow up peripheral artery occlusive disease  History of Present Illness Pam [name] is a 83 y.o. female patient of Dr. [name] with a history of bilateral iliac artery occlusive disease. She was last evaluated in our practice on 05-08-2015 by Dr. [name] and S. Rhyne PA-C. At that time the pt continued her ADL's without significant claudication symptoms.  She did not have any non healing wounds. -given that her creatinine in March 2016 was 2.2, an arteriogram was deferred due to her CKD. -encouraged her to continue a walking program -pt advised to f/u in 6 months for ABI's and advised to return sooner if she develops rest pain or if she develops a non healing wound. - pt was advised to follow up with PCP for her renal function/medication re-evaluation -she has a hx of left carotid bruit-she did have a carotid duplex in March 2016, which revealed less than 40% stenosis bilaterally  She is able to do her shopping; she occasionally has some leg pain but no real rest pain.  She returns today after a phone call from pt on 06-28-16. Reported swelling in both feet.  C/o bilat. legs hurt with walking, and at rest.  Stated the bottom of her left foot "is sore to walk on"; denied any open sores or break in skin on sole of left foot.  Also, denied any open sores bilat LE's.  Reported the swelling of her feet fluctuates.  Stated "there is redness on top of both feet, and they are a little dark."  Also, stated that she thinks the discomfort in her legs, and left foot has been present 3-4 days. She was apparently lost to follow up until today with the above complaint.   Dr. Gwenlyn Found is her cardiologist. Dr. [name] is her PCP. She states she does not have a nephrologist.  Most recent serum creatinine result on file is 1.98 on 05-09-2015  Pt Diabetic: No Pt smoker: former smoker, quit in 2016  Pt meds include: Statin :No Betablocker: No ASA: No Other  anticoagulants/antiplatelets: no  Past Medical History:  Diagnosis Date  . Anemia   . Ankle edema   . Atrial fibrillation ([place])   . CAD (coronary artery disease)   . Carotid bruit    bilateral  . Chronic kidney disease   . Diverticulosis   . GERD (gastroesophageal reflux disease)   . Hypercholesterolemia   . Hypertension   . Hypoparathyroidism ([place])   . Osteoporosis   . Peripheral arterial disease ([place])   . Personal history of colonic polyps 05/01/2001   hyperplastic  . Spinal stenosis   . Vitamin D deficiency     Social History Social History  Substance Use Topics  . Smoking status: Former Smoker    Types: Cigarettes    Quit date: 08/01/2014  . Smokeless tobacco: Never Used     Comment: smokes 2-3 cigs daily  . Alcohol use No     Comment: occ    Family History Family History  Problem Relation Age of Onset  . Heart disease Mother   . Heart disease Father   . Colon cancer Neg Hx     Past Surgical History:  Procedure Laterality Date  . ABDOMINAL AORTAGRAM N/A 05/03/2014   Procedure: ABDOMINAL Maxcine Ham;  Surgeon: Elam Dutch, MD;  [facility] CATH LAB;  Service: Cardiovascular;  Laterality: N/A;  . ABDOMINAL HYSTERECTOMY    . CARDIOVERSION  01/04/2003  . carotid duplex  01/10/2012   less than 50% bilat disease  . COLONOSCOPY    . POLYPECTOMY    . S/p Removal of bilateral kidney stones    . TRANSTHORACIC ECHOCARDIOGRAM  03/2004   EF normal; RA mod-severely dilated, LA mod dilated; mild MR, mild TR; aortic valve mildly sclerotic; mild pulm valve regurg    No Known Allergies  Current Outpatient Prescriptions  Medication Sig Dispense Refill  . acetaminophen (TYLENOL) 500 MG tablet Take 1,000 mg by mouth every 6 (six) hours as needed (pain).    Marland Kitchen diltiazem (DILACOR XR) 180 MG 24 hr capsule Take 180 mg by mouth daily.    Marland Kitchen FeFum-FePoly-FA-B Cmp-C-Biot (INTEGRA PLUS PO) Take 1 tablet by mouth daily.      . hydrALAZINE (APRESOLINE) 25 MG tablet take 1 tablet by  mouth three times a day 90 tablet 1  . lisinopril (PRINIVIL,ZESTRIL) 40 MG tablet Take 40 mg by mouth daily.    . metoprolol tartrate (LOPRESSOR) 25 MG tablet     . torsemide (DEMADEX) 10 MG tablet     . traMADol (ULTRAM) 50 MG tablet Take 1 tablet (50 mg total) by mouth every 6 (six) hours as needed for moderate pain. 20 tablet 0  . trolamine salicylate (ASPERCREME) 10 % cream Apply 1 application topically as needed for muscle pain.    . darbepoetin (ARANESP, ALBUMIN FREE,) 300 MCG/0.6ML SOLN injection Inject 300 mcg into the skin every 30 (thirty) days.    . furosemide (LASIX) 20 MG tablet Take 20 mg by mouth daily as needed for fluid.    . hydrochlorothiazide (MICROZIDE) 12.5 MG capsule Take 12.5 mg by mouth daily.     No current facility-administered medications for this visit.     ROS: See HPI for pertinent positives and negatives.   Physical Examination  Vitals:   06/30/16 1003  BP: 138/72  Pulse: 94  Resp: 16  Temp: 97.6 F (36.4 C)  TempSrc: Oral  SpO2: 96%  Weight: 123 lb (55.8 kg)  Height: 5\' 2"  (1.575 m)   Body mass index is 22.5 kg/m.  General: A&O x 3, WDWN, elderly female. Gait: limp, antalgic Eyes: PERRLA. Pulmonary: Respirations are non labored, CTAB, good air movement Cardiac: Irregular rhythm, no detected murmur.         Carotid Bruits Right Left   Negative Negative  Aorta is not palpable. Radial pulses: 1+ palpable and =                           VASCULAR EXAM: Extremities with ischemic changes: all toes of both feet are dark at the dorsal aspects, left more so than right, without Gangrene; without open wounds. Dorsal aspect of left foot with 1-2+ pitting edema and mild/moderate erythema. Left heel is painful to touch and to weight bear.                                                                                                          LE Pulses Right Left  FEMORAL  not palpable  not palpable        POPLITEAL  not palpable   not  palpable       POSTERIOR TIBIAL  not palpable   not palpable        DORSALIS PEDIS      ANTERIOR TIBIAL not palpable  not palpable    Abdomen: soft, NT, no palpable masses. Skin: no rashes, no ulcers noted, see Extremities. Musculoskeletal: no muscle wasting or atrophy.  Neurologic: A&O X 3; Appropriate Affect ; SENSATION: normal; MOTOR FUNCTION:  moving all extremities equally, motor strength 4/5 throughout. Speech is fluent/normal. CN 2-12 intact.    ASSESSMENT: Pam [name] is a 83 y.o. female who presents with: bilateral iliac artery occlusive disease.  She reports about 1 week history of left heel constant pain, worse with weight bearing. Also has swelling with mild/moderate erythema at dorsal aspect left foot. There are no open wounds, no ulcers, no gangrene in her feet/legs.   DATA ABI's: Right: 0.51 DP (monophasic), absent PT (was 0.49 on 05-08-15), TBI: 0.45 (was 0.44) Left: 0.36 DP (monophasic), PT is non compressible (monophasic); TBI: 0.00 (was 0.00). Stable ABI's compared to 05-08-15: moderate arterial occlusive disease in the right LE, severe in the left.   Dr. [name] spoke with and examined pt.   PLAN:  Based on the patient's vascular studies and examination, pt will be scheduled for an arteriogram on Friday July 02, 2016, by Dr. [name]. Pt to be given IV hydration prior to arteriogram. Needs a liter of NS before the arteriogram.  Pt may come in February 1 evening to start this process. She wants to coordinate this with her niece.   I discussed in depth with the patient the nature of atherosclerosis, and emphasized the importance of maximal medical management including strict control of blood pressure, blood glucose, and lipid levels, obtaining regular exercise, and continued cessation of smoking.  The patient is aware that without maximal medical management the underlying atherosclerotic disease process will progress, limiting the benefit of any interventions.  The  patient was given information about PAD including signs, symptoms, treatment, what symptoms should prompt the patient to seek immediate medical care, and risk reduction measures to take.  Clemon Chambers, RN, MSN, FNP-C [facility] Phone: [phone]  Clinic MD: Scot Dock  06/30/16 10:42 AM

## 2016-07-02 NOTE — Interval H&P Note (Signed)
History and Physical Interval Note:  07/02/2016 8:56 AM  Pam [name]  has presented today for surgery, with the diagnosis of ovd with bilateral lower extremity rest pain  The various methods of treatment have been discussed with the patient and family. After consideration of risks, benefits and other options for treatment, the patient has consented to  Procedure(s): Abdominal Aortogram w/Lower Extremity (N/A) as a surgical intervention .  The patient's history has been reviewed, patient examined, no change in status, stable for surgery.  I have reviewed the patient's chart and labs.  Questions were answered to the patient's satisfaction.     Pt informed of risk of contrast nephropathy possible renal failure and wishes to proceed.  [name]

## 2016-07-02 NOTE — Care Management Obs Status (Signed)
[facility] NOTIFICATION   Patient Details  Name: Pam [name] MRN: [id number] Date of Birth: 1925-08-20   Medicare Observation Status Notification Given:  Yes    Dawayne Patricia, RN 07/02/2016, 3:19 PM

## 2016-07-02 NOTE — Progress Notes (Signed)
07/01/2016 1715 Received Direct Admit to room 2W04.  Pt is A&O, no c/o voiced.  Tele monitor applied and CCMD notified.  Oriented to room, call light and bed.  Call light in reach, family at bedside. [place]

## 2016-07-02 NOTE — Op Note (Addendum)
Procedure: Abdominal aortogram with pelvic angiogram, right common and external iliac stent (8 x 60 Lifestream 2, 6 x 40 Lifestream 1)  Reoperative diagnosis: Rest pain left foot.  Postoperative diagnosis: Same  Anesthesia: Local  Operative findings: #1 chronic occlusion left common iliac artery with reconstitution of the distal left external and common femoral #2 diffuse subtotal occlusion right common and external iliac artery stented to 0 residual percent stenosis  Operative details: After informed consent, the patient was taken to the [place] lab. The patient placed in supine position the Angio table. Both groins were prepped and draped in usual sterile fashion. Local anesthesia was infiltrated over the right common femoral artery. An introducer needle was used to cannulate the right common femoral artery in an 035 versacore wire threaded in the right pelvis under fluoroscopic guidance. I could not get the guidewire to advance all the way up in the abdominal aorta. I made an attempt to place a 5 French sheath over this but we lost access because the wire came back. Pressure was held for approximate 5 minutes to obtain good hemostasis.  At this point the femoral artery was again identified under ultrasound and a micropuncture needle was used to cannulate the artery and I was able to advance the micropuncture wire fair amount into the external iliac artery to have enough purchase to place a micropuncture sheath over this. I then placed an 035 angled Glidewire through this and I was able to advance the Glidewire to the mid right common iliac. The micropuncture sheath was then removed and exchanged for a 5 French short sheath. This was thoroughly flushed with heparinized saline. A 5 [place] Berenstein 2 catheter was advanced over the [place] and this was used to manipulate and give support to the Glidewire that I was finally able to with some manipulation advance into the abdominal aorta. The Berenstein 2  catheter was advanced over the Glidewire to the proximal abdominal aorta. The Glidewire was then removed and exchanged for an 035 Rosen wire. A 5 French pig Catheter was advanced over the [employer] wire and an abdominal aortogram was then obtained with carbon dioxide contrast. Showed occlusion of the left common iliac artery at its origin. The infrarenal abdominal aorta is patent. The right common iliac artery is diffusely diseased with multiple segments of 80-90% stenosis from the origin of the right common iliac all the way down into the distal external iliac. The common femoral profunda femoris and superficial femoral origins are all patent. At this point decided to intervene on the diffusely diseased right iliac system in preparation for a femoral-femoral bypass. She was given 6000 units of intravenous heparin. ACT was confirmed to be greater than 200. The right iliac system had to be predilated so the first step in this involved exchanging the 5 [place] sheath 476 French bright tip sheath. A could not even advance the bright tip sheath up into the common iliac artery because of the tight iliac stenosis. I therefore then predilated the lesion by using a 4 x 60 angioplasty balloon and inflated this up and down the entire right iliac system. The dilator was placed back through the sheath over the wire and I still could not advance this. So a 6 x 60 balloon is also used to predilate the iliac system. At this point I was able to advance the sheath up to the iliac bifurcation but still it would not advance further. At this point I was able to advance an 8 x 60 Lifestream  stent all the way up to the aortic bifurcation and this was then deployed at 8 atm for a minute. An additional 8 x 60 stent was placed below this with some overlap of the stents. This was also done again for 8 atm for a minute. A few contrast angiograms were done to determine the full extent of the lesion. These were done using dilute contrast. At this  point a 6 x 40 Lifestream stent was then deployed with some overlap of the 8 mm stent extending the stent all the way down to the pelvic brim.  The pigtail catheter was then placed back over the guidewire and a formal power injected aortogram was obtained which showed the right common iliac and external iliac arteries were now widely patent with 0 residual stenosis with only size caliber change transitioning from the common to the external iliac artery. The sheath was pulled down as far as possible in the right groin and a retrograde atrium was also obtained to make sure there was no further narrowing external iliac artery. There was none. There was also no pressure gradient between the groin and aortic pressure at this point. The dilator was placed back over the [facility] wire into the sheath. The sheath was advanced up in the right hemipelvis to be left in place until the ACT is less than 175.  The patient tolerated procedure well and there were no complications. The patient will be scheduled for a right to left femoral-femoral bypass in the near future.  [name], MD [facility] [place] Office: [phone] Pager: [phone]

## 2016-07-03 DIAGNOSIS — I70221 Atherosclerosis of native arteries of extremities with rest pain, right leg: Secondary | ICD-10-CM | POA: Diagnosis not present

## 2016-07-03 LAB — CBC
HCT: 24.7 % — ABNORMAL LOW (ref 36.0–46.0)
HEMOGLOBIN: 8.4 g/dL — AB (ref 12.0–15.0)
MCH: 26.6 pg (ref 26.0–34.0)
MCHC: 34 g/dL (ref 30.0–36.0)
MCV: 78.2 fL (ref 78.0–100.0)
Platelets: 137 10*3/uL — ABNORMAL LOW (ref 150–400)
RBC: 3.16 MIL/uL — AB (ref 3.87–5.11)
RDW: 13.6 % (ref 11.5–15.5)
WBC: 6.3 10*3/uL (ref 4.0–10.5)

## 2016-07-03 LAB — BASIC METABOLIC PANEL
ANION GAP: 6 (ref 5–15)
BUN: 58 mg/dL — ABNORMAL HIGH (ref 6–20)
CHLORIDE: 110 mmol/L (ref 101–111)
CO2: 21 mmol/L — AB (ref 22–32)
Calcium: 9.8 mg/dL (ref 8.9–10.3)
Creatinine, Ser: 2.81 mg/dL — ABNORMAL HIGH (ref 0.44–1.00)
GFR calc non Af Amer: 14 mL/min — ABNORMAL LOW (ref >60)
GFR, EST AFRICAN AMERICAN: 16 mL/min — AB (ref >60)
Glucose, Bld: 85 mg/dL (ref 65–99)
POTASSIUM: 4.7 mmol/L (ref 3.5–5.1)
Sodium: 137 mmol/L (ref 135–145)

## 2016-07-03 MED ORDER — HYDROCHLOROTHIAZIDE 12.5 MG PO CAPS: 12.5000 mg | ORAL_CAPSULE | Freq: Every day | ORAL | Status: AC

## 2016-07-03 MED ORDER — TORSEMIDE 10 MG PO TABS: 10.0000 mg | ORAL_TABLET | Freq: Every day | ORAL | Status: DC

## 2016-07-03 NOTE — Progress Notes (Signed)
[name] to be D/C'd Home per MD order. Discussed with the patient and all questions fully answered.  Allergies as of 07/03/2016   No Known Allergies     Medication List    TAKE these medications   hydrALAZINE 25 MG tablet Commonly known as:  APRESOLINE take 1 tablet by mouth three times a day   hydrochlorothiazide 12.5 MG capsule Commonly known as:  MICROZIDE Take 1 capsule (12.5 mg total) by mouth daily. Start taking on:  07/05/2016   metoprolol tartrate 25 MG tablet Commonly known as:  LOPRESSOR Take 25 mg by mouth 2 (two) times daily.   torsemide 10 MG tablet Commonly known as:  DEMADEX Take 1 tablet (10 mg total) by mouth daily. Start taking on:  10/07/7414   trolamine salicylate 10 % cream Commonly known as:  ASPERCREME Apply 1 application topically as needed for muscle pain.       VVS, Skin clean, dry and intact without evidence of skin break down, no evidence of skin tears noted.  IV catheter discontinued intact. Site without signs and symptoms of complications. Dressing and pressure applied.  An After Visit Summary was printed and given to the patient.  Patient escorted via [place], and D/C home via private auto.  Cyndra Numbers  07/03/2016 3:30 PM

## 2016-07-03 NOTE — Progress Notes (Addendum)
  [facility] Progress Note  Subjective  - POD #1  No complaints this am.   Objective Vitals:   07/02/16 2012 07/03/16 0553  BP: 126/67 (!) 131/59  Pulse: 70 70  Resp: 18 18  Temp: 98.4 F (36.9 C) 98.1 F (36.7 C)    Intake/Output Summary (Last 24 hours) at 07/03/16 1054 Last data filed at 07/03/16 0800  Gross per 24 hour  Intake           219.17 ml  Output                0 ml  Net           219.17 ml    Right groin without hematoma.   Assessment/Planning: 83 y.o. female is s/p: aortogram with right common and external iliac stent  1 Day Post-Op   Operative findings: #1 chronic occlusion left common iliac artery with reconstitution of the distal left external and common femoral #2 diffuse subtotal occlusion right common and external iliac artery stented to 0 residual percent stenosis  Creatinine up from 2.6 to 2.8 today. Creatinine back in 2016 was 2.2. Discussed with Dr. Donnetta Hutching. Ok to d/c. Will have office arrange f/u with PCP next week to check creatinine. Encouraged increased hydration at d/c.   Will be scheduled for right to left femoral-femoral bypass in near future.   Alvia Grove 07/03/2016 10:54 AM --  Laboratory CBC    Component Value Date/Time   WBC 6.3 07/03/2016 0343   HGB 8.4 (L) 07/03/2016 0343   HGB 11.6 06/17/2014 1105   HCT 24.7 (L) 07/03/2016 0343   HCT 34.4 (L) 06/17/2014 1105   PLT 137 (L) 07/03/2016 0343   PLT 145 06/17/2014 1105    BMET    Component Value Date/Time   NA 137 07/03/2016 0343   NA 140 11/06/2012 1110   K 4.7 07/03/2016 0343   K 4.4 11/06/2012 1110   CL 110 07/03/2016 0343   CL 108 (H) 11/06/2012 1110   CO2 21 (L) 07/03/2016 0343   CO2 23 11/06/2012 1110   GLUCOSE 85 07/03/2016 0343   GLUCOSE 94 11/06/2012 1110   BUN 58 (H) 07/03/2016 0343   BUN 33.7 (H) 11/06/2012 1110   CREATININE 2.81 (H) 07/03/2016 0343   CREATININE 1.8 (H) 11/06/2012 1110   CALCIUM 9.8 07/03/2016 0343   CALCIUM 10.8  (H) 11/06/2012 1110   GFRNONAA 14 (L) 07/03/2016 0343   GFRAA 16 (L) 07/03/2016 0343    COAG Lab Results  Component Value Date   INR 1.07 07/02/2016   INR 1.08 07/01/2016   INR 1.49 10/30/2011   No results found for: PTT  Antibiotics Anti-infectives    None       Virgina Jock, PA-C [facility] Office: [phone] Pager: [phone] 07/03/2016 10:54 AM

## 2016-07-05 ENCOUNTER — Encounter: Payer: Self-pay | Admitting: *Deleted

## 2016-07-06 LAB — POCT ACTIVATED CLOTTING TIME: ACTIVATED CLOTTING TIME: 169 s

## 2016-07-07 ENCOUNTER — Encounter (HOSPITAL_COMMUNITY): Payer: Medicare Other

## 2016-07-08 NOTE — Discharge Summary (Signed)
[facility] Discharge Summary  Pam [name] 1926/02/03 83 y.o. female  [id number]  Admission Date: 07/01/2016  Discharge Date: 07/03/16  Physician: [name], MD  Admission Diagnosis: IV hydration prior to procedure ovd with bilateral lower extremity rest pain  HPI:   This is a 83 y.o. female with a history of bilateral iliac artery occlusive disease. She was last evaluated in our practice on 05-08-2015 by Dr. [name] and S. Rhyne PA-C. At that time the pt continued her ADL's without significant claudication symptoms. She did not have any non healing wounds. -given that her creatinine in March 2016 was 2.2, an arteriogram was deferred due to her CKD. -encouraged her to continue a walking program -pt advised to f/u in 6 months for ABI's and advised to return sooner if she develops rest pain or if she develops a non healing wound. - pt was advised to follow up with PCP for her renal function/medication re-evaluation -she has a hx of left carotid bruit-she did have a carotid duplex in March 2016, which revealed less than 40% stenosis bilaterally  She is able to do her shopping; she occasionally has some leg pain but no real rest pain.  She returns today after a phone call from pt on 06-28-16.Reported swelling in both feet. C/o bilat. legs hurt with walking, and at rest. Stated the bottom of her left foot "is sore to walk on"; denied any open sores or break in skin on sole of left foot. Also, denied any open sores bilat LE's. Reported the swelling of her feet fluctuates. Stated "there is redness on top of both feet, and they are a little dark." Also, stated that she thinks the discomfort in her legs, and left foot has been present 3-4 days. She was apparently lost to follow up until today with the above complaint.   Dr. Gwenlyn Found is her cardiologist. Dr. [name] is her PCP. She states she does not have a nephrologist.  Most recent serum creatinine result on  file is 1.98 on 05-09-2015  Hospital Course:  The patient was admitted to the hospital on 2/1/8 for IV hydration. She was taken to the [facility] lab on 07/02/2016 and underwent:    Abdominal aortogram with pelvic angiogram, right common and external iliac stent (8 x 60 Lifestream 2, 6 x 40 Lifestream 1)  Operative findings: #1 chronic occlusion left common iliac artery with reconstitution of the distal left external and common femoral #2 diffuse subtotal occlusion right common and external iliac artery stented to 0 residual percent stenosis  The patient tolerated the procedure well and was transported to the recovery area in stable condition.   The patient's right groin was without hematoma on POD 1. Creatinine up from 2.6 to 2.8. Creatinine back in 2016 was 2.2. Discussed with Dr. Donnetta Hutching who was ok with discharge. Will have office arrange f/u with PCP next week to check creatinine. Encouraged increased hydration at d/c.  Will be scheduled for right to left femoral-femoral bypass in near future.   CBC    Component Value Date/Time   WBC 6.3 07/03/2016 0343   RBC 3.16 (L) 07/03/2016 0343   HGB 8.4 (L) 07/03/2016 0343   HGB 11.6 06/17/2014 1105   HCT 24.7 (L) 07/03/2016 0343   HCT 34.4 (L) 06/17/2014 1105   PLT 137 (L) 07/03/2016 0343   PLT 145 06/17/2014 1105   MCV 78.2 07/03/2016 0343   MCV 83.7 06/17/2014 1105   MCH 26.6 07/03/2016 0343   MCHC  34.0 07/03/2016 0343   RDW 13.6 07/03/2016 0343   RDW 14.8 (H) 06/17/2014 1105   LYMPHSABS 2.1 06/17/2014 1105   MONOABS 1.0 (H) 06/17/2014 1105   EOSABS 0.1 06/17/2014 1105   BASOSABS 0.1 06/17/2014 1105    BMET    Component Value Date/Time   NA 137 07/03/2016 0343   NA 140 11/06/2012 1110   K 4.7 07/03/2016 0343   K 4.4 11/06/2012 1110   CL 110 07/03/2016 0343   CL 108 (H) 11/06/2012 1110   CO2 21 (L) 07/03/2016 0343   CO2 23 11/06/2012 1110   GLUCOSE 85 07/03/2016 0343   GLUCOSE 94 11/06/2012 1110   BUN 58 (H) 07/03/2016 0343   BUN  33.7 (H) 11/06/2012 1110   CREATININE 2.81 (H) 07/03/2016 0343   CREATININE 1.8 (H) 11/06/2012 1110   CALCIUM 9.8 07/03/2016 0343   CALCIUM 10.8 (H) 11/06/2012 1110   GFRNONAA 14 (L) 07/03/2016 0343   GFRAA 16 (L) 07/03/2016 0343     Discharge Instructions:   The patient is discharged to home with extensive instructions on wound care and progressive ambulation.  They are instructed not to drive or perform any heavy lifting until returning to see the physician in his office.  Discharge Instructions    Call MD for:  redness, tenderness, or signs of infection (pain, swelling, bleeding, redness, odor or green/yellow discharge around incision site)    Complete by:  As directed    Call MD for:  severe or increased pain, loss or decreased feeling  in affected limb(s)    Complete by:  As directed    Call MD for:  temperature >100.5    Complete by:  As directed    Discharge wound care:    Complete by:  As directed    Take off dressing left groin tomorrow. Shower daily and wash groin with soap and water and pat dry. You do not have to reapply a dressing.   Driving Restrictions    Complete by:  As directed    No driving for 2 weeks   Increase activity slowly    Complete by:  As directed    Walk with assistance use walker or cane as needed   Lifting restrictions    Complete by:  As directed    No lifting for 2 weeks   Resume previous diet    Complete by:  As directed       Discharge Diagnosis:  IV hydration prior to procedure ovd with bilateral lower extremity rest pain  Secondary Diagnosis: Patient Active Problem List   Diagnosis Date Noted  . PAD (peripheral artery disease) ([place]) 07/01/2016  . Abdominal aortic aneurysm without rupture ([place]) 04/18/2014  . PVD (peripheral vascular disease) ([place]) 03/20/2014  . Ankle edema 01/15/2013  . ARTHRITIS, KNEE 05/16/2009  . VULVAR ABSCESS 05/02/2009  . Anemia 08/09/2008  . FECAL OCCULT BLOOD 08/09/2008  . VITAMIN D DEFICIENCY 08/08/2008    . HYPERLIPIDEMIA 08/08/2008  . Essential hypertension 08/08/2008  . FIBRILLATION, ATRIAL 08/08/2008  . CAROTID ARTERY DISEASE 08/08/2008  . GERD 08/08/2008  . HIATAL HERNIA 08/08/2008  . DIVERTICULOSIS, COLON 08/08/2008  . GASTROINTESTINAL HEMORRHAGE 08/08/2008  . RENAL DISEASE, CHRONIC 08/08/2008  . SPINAL STENOSIS 08/08/2008  . OSTEOPOROSIS 08/08/2008  . HYPERPARATHYROIDISM, HX OF 08/08/2008  . COLONIC POLYPS, HYPERPLASTIC, HX OF 08/08/2008  . RENAL CALCULUS, HX OF 08/08/2008   Past Medical History:  Diagnosis Date  . Anemia   . Ankle edema   . Atrial fibrillation (  [place])   . CAD (coronary artery disease)   . Carotid bruit    bilateral  . Chronic kidney disease   . Diverticulosis   . GERD (gastroesophageal reflux disease)   . History of blood transfusion    "probably related to anemia"  . History of kidney stones   . Hypercholesterolemia   . Hypertension   . Hypoparathyroidism ([place])   . Osteoporosis   . PAD (peripheral artery disease) ([place])   . Personal history of colonic polyps 05/01/2001   hyperplastic  . Spinal stenosis   . Vitamin D deficiency      Allergies as of 07/03/2016   No Known Allergies     Medication List    TAKE these medications   hydrALAZINE 25 MG tablet Commonly known as:  APRESOLINE take 1 tablet by mouth three times a day   hydrochlorothiazide 12.5 MG capsule Commonly known as:  MICROZIDE Take 1 capsule (12.5 mg total) by mouth daily.   metoprolol tartrate 25 MG tablet Commonly known as:  LOPRESSOR Take 25 mg by mouth 2 (two) times daily.   torsemide 10 MG tablet Commonly known as:  DEMADEX Take 1 tablet (10 mg total) by mouth daily.   trolamine salicylate 10 % cream Commonly known as:  ASPERCREME Apply 1 application topically as needed for muscle pain.       Disposition: Home  Patient's condition: is Good  Follow up: 1. Dr. [name] in 1-2 weeks   Virgina Jock, PA-C [facility] [phone] 07/08/2016  10:01 AM

## 2016-07-09 ENCOUNTER — Ambulatory Visit: Payer: Medicare Other | Admitting: Family

## 2016-07-15 ENCOUNTER — Ambulatory Visit (INDEPENDENT_AMBULATORY_CARE_PROVIDER_SITE_OTHER): Payer: Self-pay | Admitting: Family

## 2016-07-15 ENCOUNTER — Ambulatory Visit (HOSPITAL_COMMUNITY)
Admission: RE | Admit: 2016-07-15 | Discharge: 2016-07-15 | Disposition: A | Discharge status: Home / Self Care | Payer: Medicare Other | Source: Ambulatory Visit | Attending: Family | Admitting: Family

## 2016-07-15 ENCOUNTER — Encounter: Payer: Self-pay | Admitting: Family

## 2016-07-15 VITALS — BP 130/76 | HR 74 | Temp 97.4°F | Resp 16 | Ht 62.0 in | Wt 120.0 lb

## 2016-07-15 DIAGNOSIS — I739 Peripheral vascular disease, unspecified: Secondary | ICD-10-CM | POA: Insufficient documentation

## 2016-07-15 DIAGNOSIS — Z87891 Personal history of nicotine dependence: Secondary | ICD-10-CM

## 2016-07-15 DIAGNOSIS — I7409 Other arterial embolism and thrombosis of abdominal aorta: Secondary | ICD-10-CM

## 2016-07-15 DIAGNOSIS — R9439 Abnormal result of other cardiovascular function study: Secondary | ICD-10-CM | POA: Diagnosis not present

## 2016-07-15 NOTE — Patient Instructions (Signed)

## 2016-07-15 NOTE — Progress Notes (Signed)
[facility] [place]   CC: Follow up peripheral artery occlusive disease  History of Present Illness Pam [name] is a 83 y.o. female patient of Dr. [name] with a history of bilateral iliac artery occlusive disease. She is s/p right common and external iliac stent (8 x 60 Lifestream 2, 6 x 40 Lifestream 1) placed on 07-02-16 by Dr. [name] for rest pain left foot Operative findings: #1 chronic occlusion left common iliac artery with reconstitution of the distal left external and common femoral #2 diffuse subtotal occlusion right common and external iliac artery stented to 0 residual percent stenosis.  She returns today for follow up.  Since the above procedure, she no longer has left foot pain or swelling, no claudication symptoms with walking.   She was evaluated in our practice on 05-08-2015 by Dr. [name] and S. Rhyne PA-C. At that time the pt continued her ADL's without significant claudication symptoms. She did not have any non healing wounds. -given that her creatinine in March 2016 was 2.2, an arteriogram was deferred due to her CKD. -encouraged her to continue a walking program -pt advised to f/u in 6 months for ABI's and advised to return sooner if she develops rest pain or if she develops a non healing wound. - pt was advised to follow up with PCP for her renal function/medication re-evaluation -she has a hx of left carotid bruit-she did have a carotid duplex in March 2016, which revealed less than 40% stenosis bilaterally  She was able to do her shopping; she occasionally has some leg pain but no real rest pain.  I evaluated pt on 06-30-16 after she notified our office of swelling in both feet. C/o bilat. legs hurt with walking, and at rest. Stated the bottom of her left foot "is sore to walk on"; denied any open sores or break in skin on sole of left foot. Also, denied any open sores bilat LE's. Reported the swelling of her feet fluctuates. Stated "there is  redness on top of both feet, and they are a little dark." Also, stated that she thinks the discomfort in her legs, and left foot has been present 3-4 days. She was apparently lost to follow up until that day with the above complaint.   Dr. Gwenlyn Found is her cardiologist. Dr. [name] is her PCP. She states she does not have a nephrologist.  Serum creatinine result on file was 1.98 on 05-09-2015.  Pt Diabetic: No Pt smoker: former smoker, quit in 2016  Pt meds include: Statin :No Betablocker: No ASA: No Other anticoagulants/antiplatelets: no    Past Medical History:  Diagnosis Date  . Anemia   . Ankle edema   . Atrial fibrillation ([place])   . CAD (coronary artery disease)   . Carotid bruit    bilateral  . Chronic kidney disease   . Diverticulosis   . GERD (gastroesophageal reflux disease)   . History of blood transfusion    "probably related to anemia"  . History of kidney stones   . Hypercholesterolemia   . Hypertension   . Hypoparathyroidism ([place])   . Osteoporosis   . PAD (peripheral artery disease) ([place])   . Personal history of colonic polyps 05/01/2001   hyperplastic  . Spinal stenosis   . Vitamin D deficiency     Social History Social History  Substance Use Topics  . Smoking status: Former Smoker    Packs/day: 0.12    Years: 65.00    Types: Cigarettes  Quit date: 08/01/2014  . Smokeless tobacco: Never Used     Comment: "started smoking early in my ?23s"  . Alcohol use 2.4 oz/week    4 Shots of liquor per week     Comment: 07/01/2016 "I drink boubon on the weekend"    Family History Family History  Problem Relation Age of Onset  . Heart disease Mother   . Heart disease Father   . Colon cancer Neg Hx     Past Surgical History:  Procedure Laterality Date  . ABDOMINAL AORTAGRAM N/A 05/03/2014   Procedure: ABDOMINAL Maxcine Ham;  Surgeon: Elam Dutch, MD;  Location: [facility] CATH LAB;  Service: Cardiovascular;  Laterality: N/A;  . ABDOMINAL HYSTERECTOMY     . APPENDECTOMY    . CARDIOVERSION  01/04/2003  . carotid duplex  01/10/2012   less than 50% bilat disease  . CATARACT EXTRACTION, BILATERAL Bilateral   . COLONOSCOPY    . KIDNEY STONE SURGERY Bilateral    Removal of bilateral kidney stones; "they cut me open"  . PERIPHERAL VASCULAR CATHETERIZATION N/A 07/02/2016   Procedure: Abdominal Aortogram;  Surgeon: Elam Dutch, MD;  Location: [facility] CV LAB;  Service: Cardiovascular;  Laterality: N/A;  . PERIPHERAL VASCULAR CATHETERIZATION Bilateral 07/02/2016   Procedure: Lower Extremity Angiography;  Surgeon: Elam Dutch, MD;  Location: [facility] CV LAB;  Service: Cardiovascular;  Laterality: Bilateral;  LIMITED TO ILIACS  . PERIPHERAL VASCULAR CATHETERIZATION Right 07/02/2016   Procedure: Peripheral Vascular Intervention;  Surgeon: Elam Dutch, MD;  Location: [facility] CV LAB;  Service: Cardiovascular;  Laterality: Right;  COMMON AND EXTERNAL ILIACS  STENTS X 3  . POLYPECTOMY    . TRANSTHORACIC ECHOCARDIOGRAM  03/2004   EF normal; RA mod-severely dilated, LA mod dilated; mild MR, mild TR; aortic valve mildly sclerotic; mild pulm valve regurg    No Known Allergies  Current Outpatient Prescriptions  Medication Sig Dispense Refill  . hydrALAZINE (APRESOLINE) 25 MG tablet take 1 tablet by mouth three times a day 90 tablet 1  . hydrochlorothiazide (MICROZIDE) 12.5 MG capsule Take 1 capsule (12.5 mg total) by mouth daily.    . metoprolol tartrate (LOPRESSOR) 25 MG tablet Take 25 mg by mouth 2 (two) times daily.     Marland Kitchen torsemide (DEMADEX) 10 MG tablet Take 1 tablet (10 mg total) by mouth daily.    Marland Kitchen trolamine salicylate (ASPERCREME) 10 % cream Apply 1 application topically as needed for muscle pain.     No current facility-administered medications for this visit.     ROS: See HPI for pertinent positives and negatives.   Physical Examination  Vitals:   07/15/16 1329  BP: 130/76  Pulse: 74  Resp: 16  Temp: 97.4 F (36.3 C)   TempSrc: Oral  SpO2: 99%  Weight: 120 lb (54.4 kg)  Height: 5\' 2"  (1.575 m)   Body mass index is 21.95 kg/m.  General: A&O x 3, WDWN, elderly female. Gait: normal Eyes: PERRLA. Pulmonary: Respirations are non labored, CTAB, good air movement Cardiac: Irregular rhythm, no detected murmur.         Carotid Bruits Right Left   Negative Negative  Aorta is not palpable. Radial pulses: 1+ palpable and =                           VASCULAR EXAM: Extremities with ischemic changes: all toes of both feet are less dark at the dorsal aspects, left more so than right,  since the right CIA and EIA stenting on 07-02-16.  without Gangrene; without open wounds. Dorsal aspect of left foot with 1-2+ pitting edema and mild/moderate erythema has resolved. Left heel is no longer painful to touch and to weight bear.                                                                                                                                                       LE Pulses Right Left       FEMORAL  not palpable  not palpable        POPLITEAL  not palpable   not palpable       POSTERIOR TIBIAL  not palpable   not palpable        DORSALIS PEDIS      ANTERIOR TIBIAL not palpable  not palpable    Abdomen: soft, NT, no palpable masses. Skin: no rashes, no ulcers noted, see Extremities. Musculoskeletal: no muscle wasting or atrophy.         Neurologic: A&O X 3; Appropriate Affect ; SENSATION: normal; MOTOR FUNCTION:  moving all extremities equally, motor strength 4/5 throughout. Speech is fluent/normal. CN 2-12 intact.     ASSESSMENT: JANIA STEINKE is a 83 y.o. female who presents with: bilateral iliac artery occlusive disease.  She is s/p right common and external iliac stent (8 x 60 Lifestream 2, 6 x 40 Lifestream 1) placed on 07-02-16 by Dr. [name] for rest pain left foot Operative findings: #1 chronic occlusion left common iliac artery with reconstitution of the distal left external and  common femoral #2 diffuse subtotal occlusion right common and external iliac artery stented to 0 residual percent stenosis.  She no longer has pain in her left foot nor swelling in either foot since the above stenting, but her ABI's and TBI's have not improved.  There are no signs of ischemia in her feet/legs.    DATA Today's ABI's: Right: 0.55 DP(was 0.51 on 06-30-16), absent PT (remains absent), TBI: 0.36 (was 0.45) Left: 0.33 DP (was 0.36, 06-30-16), PT remains non compressible ; TBI: 0.00 (was 0.00). Stable ABI's compared to 06-30-16 moderate arterial occlusive disease in the right LE, severe in the left.   Carotid duplex in March 2016, which revealed less than 40% stenosis bilaterally.  PLAN:   Based on today's non invasive lab results, HPI, and physical exam results, and after discussing with Dr. [name], pt will return in 3 months with ABI's. Graduated walking program discussed and how to achieve.  I advised pt to notify us if she develops rest pain in her lower extremities or a sore that has trouble healing.    I discussed in depth with the patient the nature of atherosclerosis, and emphasized the importance of maximal medical management including strict control of blood pressure, blood glucose, and lipid  levels, obtaining regular exercise, and continued cessation of smoking.  The patient is aware that without maximal medical management the underlying atherosclerotic disease process will progress, limiting the benefit of any interventions.  The patient was given information about PAD including signs, symptoms, treatment, what symptoms should prompt the patient to seek immediate medical care, and risk reduction measures to take.  Clemon Chambers, RN, MSN, FNP-C [facility] Phone: [phone]  Clinic MD: [name]  07/15/16 1:35 PM

## 2016-07-16 ENCOUNTER — Encounter: Payer: Self-pay | Admitting: Internal Medicine

## 2016-07-22 NOTE — Addendum Note (Signed)
Addended by: Lianne Cure A on: 07/22/2016 04:08 PM   Modules accepted: Orders

## 2016-07-27 ENCOUNTER — Other Ambulatory Visit: Payer: Self-pay | Admitting: Cardiovascular Disease

## 2016-10-11 ENCOUNTER — Encounter: Payer: Self-pay | Admitting: Family

## 2016-10-14 ENCOUNTER — Encounter: Payer: Self-pay | Admitting: Family

## 2016-10-14 ENCOUNTER — Ambulatory Visit (INDEPENDENT_AMBULATORY_CARE_PROVIDER_SITE_OTHER): Payer: Medicare Other | Admitting: Family

## 2016-10-14 ENCOUNTER — Ambulatory Visit (HOSPITAL_COMMUNITY)
Admission: RE | Admit: 2016-10-14 | Discharge: 2016-10-14 | Disposition: A | Discharge status: Home / Self Care | Payer: Medicare Other | Source: Ambulatory Visit | Attending: Family | Admitting: Family

## 2016-10-14 VITALS — BP 131/80 | HR 83 | Resp 16 | Ht 62.0 in | Wt 117.2 lb

## 2016-10-14 DIAGNOSIS — Z87891 Personal history of nicotine dependence: Secondary | ICD-10-CM

## 2016-10-14 DIAGNOSIS — I7409 Other arterial embolism and thrombosis of abdominal aorta: Secondary | ICD-10-CM | POA: Insufficient documentation

## 2016-10-14 NOTE — Patient Instructions (Signed)

## 2016-10-14 NOTE — Progress Notes (Signed)
[facility] [place]   CC: Follow up peripheral artery occlusive disease  History of Present Illness SALVADOR BIGBEE is a 83 y.o. female patient of Dr. [name] with a history of bilateral iliac artery occlusive disease. She is s/p right common and external iliac stent (8 x 60 Lifestream 2, 6 x 40 Lifestream 1) placed on 07-02-16 by Dr. [name] for rest pain left foot Operative findings: #1 chronic occlusion left common iliac artery with reconstitution of the distal left external and common femoral #2 diffuse subtotal occlusion right common and external iliac artery stented to 0 residual percent stenosis.  She returns today for follow up.  Since the above procedure, she no longer has left foot pain or swelling, no claudication symptoms with walking.   She was evaluated in our practice on 05-08-2015 by Dr. [name] and S. Rhyne PA-C. At that time the pt continuedher ADL's without significant claudication symptoms. She didnot have any non healing wounds. -given that her creatinine in March 2016was 2.2, an arteriogram was deferred due to her CKD. -encouraged her to continue a walking program -pt advised tof/u in 6 months for ABI's and advised toreturn sooner if she develops rest pain or if she develops a non healing wound. - pt was advised to follow up with PCP for her renal function/medication re-evaluation -she hasa hx of left carotid bruit-she did have a carotid duplex in March 2016, which revealed less than 40% stenosis bilaterally She was able to do her shopping;she occasionally has some leg pain but no real rest pain.  I evaluated pt on 06-30-16 after she notified our office of swelling in both feet. C/o bilat. legs hurt with walking, and at rest. Stated the bottom of her left foot "is sore to walk on"; denied any open sores or break in skin on sole of left foot. Also, denied any open sores bilat LE's. Reported the swelling of her feet fluctuates. Stated "there is  redness on top of both feet, and they are a little dark." Also, stated that she thinks the discomfort in her legs, and left foot has been present 3-4 days. She was apparently lost to follow up until that day with the above complaint.   Today she does not c/o any pain or redness in her feet or legs, but does ask why the base of her toes at the dorsum of both feet have bee dark for weeks or months, not worsening.   Dr. Gwenlyn Found is her cardiologist. Dr. [name] is her PCP. She states she does not have a nephrologist.  Serum creatinine result on file was 1.98 on 05-09-2015.  Pt Diabetic: No Pt smoker: former smoker, quit in 2016  Pt meds include: Statin :No Betablocker: No ASA: No Other anticoagulants/antiplatelets: no    Past Medical History:  Diagnosis Date  . Anemia   . Ankle edema   . Atrial fibrillation ([place])   . CAD (coronary artery disease)   . Carotid bruit    bilateral  . Chronic kidney disease   . Diverticulosis   . GERD (gastroesophageal reflux disease)   . History of blood transfusion    "probably related to anemia"  . History of kidney stones   . Hypercholesterolemia   . Hypertension   . Hypoparathyroidism ([place])   . Osteoporosis   . PAD (peripheral artery disease) ([place])   . Personal history of colonic polyps 05/01/2001   hyperplastic  . Spinal stenosis   . Vitamin D deficiency  Social History Social History  Substance Use Topics  . Smoking status: Former Smoker    Packs/day: 0.12    Years: 65.00    Types: Cigarettes    Quit date: 08/01/2014  . Smokeless tobacco: Never Used     Comment: "started smoking early in my ?11s"  . Alcohol use 2.4 oz/week    4 Shots of liquor per week     Comment: 07/01/2016 "I drink boubon on the weekend"    Family History Family History  Problem Relation Age of Onset  . Heart disease Mother   . Heart disease Father   . Colon cancer Neg Hx     Past Surgical History:  Procedure Laterality Date  . ABDOMINAL  AORTAGRAM N/A 05/03/2014   Procedure: ABDOMINAL Maxcine Ham;  Surgeon: Elam Dutch, MD;  [facility] LAB;  Service: Cardiovascular;  Laterality: N/A;  . ABDOMINAL HYSTERECTOMY    . APPENDECTOMY    . CARDIOVERSION  01/04/2003  . carotid duplex  01/10/2012   less than 50% bilat disease  . CATARACT EXTRACTION, BILATERAL Bilateral   . COLONOSCOPY    . KIDNEY STONE SURGERY Bilateral    Removal of bilateral kidney stones; "they cut me open"  . PERIPHERAL VASCULAR CATHETERIZATION N/A 07/02/2016   Procedure: Abdominal Aortogram;  Surgeon: Elam Dutch, MD;  Location: [place] CV LAB;  Service: Cardiovascular;  Laterality: N/A;  . PERIPHERAL VASCULAR CATHETERIZATION Bilateral 07/02/2016   Procedure: Lower Extremity Angiography;  Surgeon: Elam Dutch, MD;  Location: [place] CV LAB;  Service: Cardiovascular;  Laterality: Bilateral;  LIMITED TO ILIACS  . PERIPHERAL VASCULAR CATHETERIZATION Right 07/02/2016   Procedure: Peripheral Vascular Intervention;  Surgeon: Elam Dutch, MD;  Location: [place] CV LAB;  Service: Cardiovascular;  Laterality: Right;  COMMON AND EXTERNAL ILIACS  STENTS X 3  . POLYPECTOMY    . TRANSTHORACIC ECHOCARDIOGRAM  03/2004   EF normal; RA mod-severely dilated, LA mod dilated; mild MR, mild TR; aortic valve mildly sclerotic; mild pulm valve regurg    No Known Allergies  Current Outpatient Prescriptions  Medication Sig Dispense Refill  . hydrALAZINE (APRESOLINE) 25 MG tablet take 1 tablet by mouth three times a day 90 tablet 1  . hydrochlorothiazide (MICROZIDE) 12.5 MG capsule Take 1 capsule (12.5 mg total) by mouth daily.    . metoprolol tartrate (LOPRESSOR) 25 MG tablet Take 25 mg by mouth 2 (two) times daily.     Marland Kitchen torsemide (DEMADEX) 10 MG tablet Take 1 tablet (10 mg total) by mouth daily.    Marland Kitchen trolamine salicylate (ASPERCREME) 10 % cream Apply 1 application topically as needed for muscle pain.     No current facility-administered medications for  this visit.      ROS: See HPI for pertinent positives and negatives.   Physical Examination  Vitals:   10/14/16 1455  BP: 131/80  Pulse: 83  Resp: 16  SpO2: 99%  Weight: 117 lb 3 oz (53.2 kg)  Height: 5\' 2"  (1.575 m)   Body mass index is 21.43 kg/m.  General: A&O x 3, WDWN, elderly female. Gait: normal Eyes: PERRLA. Pulmonary: Respirations are non labored, CTAB, good air movement Cardiac: Regular rhythm, no detected murmur.    Carotid Bruits Right Left   Negative Negative  Aorta is notpalpable. Radial pulses: 1+ palpable and =  VASCULAR EXAM: Extremitieswithischemic changes:  the base of all toes of both feet are slightly dark at the dorsal aspects, left more so than right, since the right  CIA and EIA stenting on 07-02-16.  WithoutGangrene; withoutopen wounds.No edema. Left heel is no longer painful to touch and to weight bear.  LE Pulses Right Left  FEMORAL notpalpable notpalpable   POPLITEAL notpalpable  notpalpable  POSTERIOR TIBIAL notpalpable  notpalpable   DORSALIS PEDIS ANTERIOR TIBIAL notpalpable  notpalpable    Abdomen: soft, NT, no palpable masses. Skin: no rashes, no ulcers noted, see Extremities. Musculoskeletal: no muscle wasting or atrophy. Neurologic: A&O X 3; Appropriate Affect ; SENSATION: normal; MOTOR FUNCTION: moving all extremities equally, motor strength 4/5 throughout. Speech is fluent/normal. CN 2-12 intact.    ASSESSMENT: OAKLIE DURRETT is a 83 y.o. female who presents with: bilateral iliac artery occlusive disease.  She is s/p right common and external iliac stent (8 x 60 Lifestream 2, 6 x 40 Lifestream 1) placed on 07-02-16 by Dr. [name] for rest pain left foot Operative findings: #1 chronic occlusion left common iliac artery with reconstitution of the distal left external and common femoral #2 diffuse subtotal occlusion right common and  external iliac artery stented to 0 residual percent stenosis.  She no longer has pain in her left foot nor swelling in either foot since the above stenting, but her ABI's and TBI's have not improved.  There are no signs of ischemia in her feet/legs.  She does not complain of claudication with walking or at rest.    DATA Today's ABI's: Right: 0.50 DP(was 0.55 on 07-15-16) peroneal is 0.58 (not documented on 07-15-16), absent PT (remains absent), TBI: 0.32 (was 0.0.36) Left: 0.45 DP (was 0.33, 07-15-16), PT: 0.70 (was non compressible) ; TBI: 0.00 (was 0.00). All monophasic waveforms except absent PT.  Stable ABI's compared to 07-15-16 moderate arterial occlusive disease bilaterally.   Carotid duplex in March 2016, which revealed less than 40% stenosis bilaterally.     PLAN:  Based on the patient's vascular studies and examination, and after discussing with Dr. [name], pt will return to clinic in 6 months with ABI's  I discussed in depth with the patient the nature of atherosclerosis, and emphasized the importance of maximal medical management including strict control of blood pressure, blood glucose, and lipid levels, obtaining regular exercise, and continued cessation of smoking.  The patient is aware that without maximal medical management the underlying atherosclerotic disease process will progress, limiting the benefit of any interventions.  The patient was given information about PAD including signs, symptoms, treatment, what symptoms should prompt the patient to seek immediate medical care, and risk reduction measures to take.  Clemon Chambers, RN, MSN, FNP-C [facility] [employer] Phone: [phone]  Clinic MD: [name]  10/14/16 3:00 PM

## 2016-10-20 NOTE — Addendum Note (Signed)
Addended by: Lianne Cure A on: 10/20/2016 01:12 PM   Modules accepted: Orders

## 2016-11-20 ENCOUNTER — Emergency Department (HOSPITAL_COMMUNITY): Payer: Medicare Other

## 2016-11-20 ENCOUNTER — Encounter (HOSPITAL_COMMUNITY): Payer: Self-pay | Admitting: *Deleted

## 2016-11-20 ENCOUNTER — Inpatient Hospital Stay (HOSPITAL_COMMUNITY)
Admission: EM | Admit: 2016-11-20 | Discharge: 2016-11-22 | DRG: 389 | Disposition: A | Discharge status: Home / Self Care | Payer: Medicare Other | Attending: Internal Medicine | Admitting: Internal Medicine

## 2016-11-20 DIAGNOSIS — Z8249 Family history of ischemic heart disease and other diseases of the circulatory system: Secondary | ICD-10-CM

## 2016-11-20 DIAGNOSIS — K5641 Fecal impaction: Principal | ICD-10-CM | POA: Diagnosis present

## 2016-11-20 DIAGNOSIS — Z9841 Cataract extraction status, right eye: Secondary | ICD-10-CM

## 2016-11-20 DIAGNOSIS — I129 Hypertensive chronic kidney disease with stage 1 through stage 4 chronic kidney disease, or unspecified chronic kidney disease: Secondary | ICD-10-CM | POA: Diagnosis present

## 2016-11-20 DIAGNOSIS — E785 Hyperlipidemia, unspecified: Secondary | ICD-10-CM | POA: Diagnosis present

## 2016-11-20 DIAGNOSIS — D649 Anemia, unspecified: Secondary | ICD-10-CM | POA: Diagnosis present

## 2016-11-20 DIAGNOSIS — M810 Age-related osteoporosis without current pathological fracture: Secondary | ICD-10-CM | POA: Diagnosis present

## 2016-11-20 DIAGNOSIS — K5730 Diverticulosis of large intestine without perforation or abscess without bleeding: Secondary | ICD-10-CM | POA: Diagnosis present

## 2016-11-20 DIAGNOSIS — R52 Pain, unspecified: Secondary | ICD-10-CM | POA: Diagnosis not present

## 2016-11-20 DIAGNOSIS — E559 Vitamin D deficiency, unspecified: Secondary | ICD-10-CM | POA: Diagnosis present

## 2016-11-20 DIAGNOSIS — Z9842 Cataract extraction status, left eye: Secondary | ICD-10-CM

## 2016-11-20 DIAGNOSIS — N179 Acute kidney failure, unspecified: Secondary | ICD-10-CM | POA: Diagnosis present

## 2016-11-20 DIAGNOSIS — K5900 Constipation, unspecified: Secondary | ICD-10-CM | POA: Diagnosis present

## 2016-11-20 DIAGNOSIS — I2510 Atherosclerotic heart disease of native coronary artery without angina pectoris: Secondary | ICD-10-CM | POA: Diagnosis present

## 2016-11-20 DIAGNOSIS — K219 Gastro-esophageal reflux disease without esophagitis: Secondary | ICD-10-CM | POA: Diagnosis present

## 2016-11-20 DIAGNOSIS — R197 Diarrhea, unspecified: Secondary | ICD-10-CM

## 2016-11-20 DIAGNOSIS — E871 Hypo-osmolality and hyponatremia: Secondary | ICD-10-CM | POA: Diagnosis present

## 2016-11-20 DIAGNOSIS — Z86010 Personal history of colonic polyps: Secondary | ICD-10-CM

## 2016-11-20 DIAGNOSIS — N184 Chronic kidney disease, stage 4 (severe): Secondary | ICD-10-CM | POA: Diagnosis present

## 2016-11-20 DIAGNOSIS — D509 Iron deficiency anemia, unspecified: Secondary | ICD-10-CM | POA: Diagnosis present

## 2016-11-20 DIAGNOSIS — I10 Essential (primary) hypertension: Secondary | ICD-10-CM | POA: Diagnosis present

## 2016-11-20 DIAGNOSIS — N19 Unspecified kidney failure: Secondary | ICD-10-CM

## 2016-11-20 DIAGNOSIS — R531 Weakness: Secondary | ICD-10-CM

## 2016-11-20 DIAGNOSIS — I739 Peripheral vascular disease, unspecified: Secondary | ICD-10-CM | POA: Diagnosis present

## 2016-11-20 DIAGNOSIS — Z87891 Personal history of nicotine dependence: Secondary | ICD-10-CM

## 2016-11-20 DIAGNOSIS — Z90710 Acquired absence of both cervix and uterus: Secondary | ICD-10-CM

## 2016-11-20 DIAGNOSIS — I4891 Unspecified atrial fibrillation: Secondary | ICD-10-CM | POA: Diagnosis present

## 2016-11-20 LAB — CBC
HCT: 35.7 % — ABNORMAL LOW (ref 36.0–46.0)
HEMOGLOBIN: 12.7 g/dL (ref 12.0–15.0)
MCH: 27 pg (ref 26.0–34.0)
MCHC: 35.6 g/dL (ref 30.0–36.0)
MCV: 75.8 fL — ABNORMAL LOW (ref 78.0–100.0)
PLATELETS: 196 10*3/uL (ref 150–400)
RBC: 4.71 MIL/uL (ref 3.87–5.11)
RDW: 14.1 % (ref 11.5–15.5)
WBC: 17.8 10*3/uL — ABNORMAL HIGH (ref 4.0–10.5)

## 2016-11-20 LAB — URINALYSIS, ROUTINE W REFLEX MICROSCOPIC
BILIRUBIN URINE: NEGATIVE
Glucose, UA: NEGATIVE mg/dL
HGB URINE DIPSTICK: NEGATIVE
Ketones, ur: NEGATIVE mg/dL
Leukocytes, UA: NEGATIVE
NITRITE: NEGATIVE
PROTEIN: NEGATIVE mg/dL
SPECIFIC GRAVITY, URINE: 1.011 (ref 1.005–1.030)
pH: 5 (ref 5.0–8.0)

## 2016-11-20 LAB — COMPREHENSIVE METABOLIC PANEL
ALK PHOS: 74 U/L (ref 38–126)
ALT: 19 U/L (ref 14–54)
ANION GAP: 15 (ref 5–15)
AST: 23 U/L (ref 15–41)
Albumin: 3.9 g/dL (ref 3.5–5.0)
BILIRUBIN TOTAL: 2 mg/dL — AB (ref 0.3–1.2)
BUN: 117 mg/dL — ABNORMAL HIGH (ref 6–20)
CALCIUM: 10.7 mg/dL — AB (ref 8.9–10.3)
CO2: 25 mmol/L (ref 22–32)
CREATININE: 3.16 mg/dL — AB (ref 0.44–1.00)
Chloride: 94 mmol/L — ABNORMAL LOW (ref 101–111)
GFR, EST AFRICAN AMERICAN: 14 mL/min — AB (ref >60)
GFR, EST NON AFRICAN AMERICAN: 12 mL/min — AB (ref >60)
Glucose, Bld: 124 mg/dL — ABNORMAL HIGH (ref 65–99)
Potassium: 4.2 mmol/L (ref 3.5–5.1)
Sodium: 134 mmol/L — ABNORMAL LOW (ref 135–145)
TOTAL PROTEIN: 7.6 g/dL (ref 6.5–8.1)

## 2016-11-20 LAB — C DIFFICILE QUICK SCREEN W PCR REFLEX
C DIFFICILE (CDIFF) TOXIN: NEGATIVE
C DIFFICLE (CDIFF) ANTIGEN: POSITIVE — AB

## 2016-11-20 LAB — OCCULT BLOOD X 1 CARD TO LAB, STOOL: FECAL OCCULT BLD: NEGATIVE

## 2016-11-20 LAB — LIPASE, BLOOD: Lipase: 39 U/L (ref 11–51)

## 2016-11-20 MED ORDER — HYDROCHLOROTHIAZIDE 12.5 MG PO CAPS: 12.5000 mg | ORAL_CAPSULE | Freq: Every day | ORAL | Status: DC

## 2016-11-20 MED ORDER — SODIUM CHLORIDE 0.9 % IV BOLUS (SEPSIS)
500.0000 mL | Freq: Once | INTRAVENOUS | Status: AC
  Administered 2016-11-20: 500 mL via INTRAVENOUS

## 2016-11-20 MED ORDER — ONDANSETRON HCL 4 MG/2ML IJ SOLN: 4.0000 mg | Freq: Four times a day (QID) | INTRAMUSCULAR | Status: DC | PRN

## 2016-11-20 MED ORDER — SODIUM CHLORIDE 0.9 % IV SOLN
INTRAVENOUS | Status: DC
  Administered 2016-11-20: 23:00:00 via INTRAVENOUS

## 2016-11-20 MED ORDER — SODIUM CHLORIDE 0.9 % IV BOLUS (SEPSIS): 500.0000 mL | Freq: Once | INTRAVENOUS | Status: DC

## 2016-11-20 MED ORDER — ONDANSETRON HCL 4 MG PO TABS: 4.0000 mg | ORAL_TABLET | Freq: Four times a day (QID) | ORAL | Status: DC | PRN

## 2016-11-20 MED ORDER — BISACODYL 10 MG RE SUPP
10.0000 mg | Freq: Once | RECTAL | Status: AC
  Administered 2016-11-20: 10 mg via RECTAL
  Filled 2016-11-20: qty 1

## 2016-11-20 MED ORDER — ENOXAPARIN SODIUM 30 MG/0.3ML ~~LOC~~ SOLN
30.0000 mg | Freq: Every day | SUBCUTANEOUS | Status: DC
  Administered 2016-11-20 – 2016-11-21 (×2): 30 mg via SUBCUTANEOUS
  Filled 2016-11-20 (×2): qty 0.3

## 2016-11-20 MED ORDER — IOPAMIDOL (ISOVUE-300) INJECTION 61%
INTRAVENOUS | Status: AC
  Administered 2016-11-20: 30 mL via ORAL
  Filled 2016-11-20: qty 30

## 2016-11-20 MED ORDER — METOPROLOL TARTRATE 25 MG PO TABS
25.0000 mg | ORAL_TABLET | Freq: Two times a day (BID) | ORAL | Status: DC
  Administered 2016-11-20 – 2016-11-22 (×4): 25 mg via ORAL
  Filled 2016-11-20 (×4): qty 1

## 2016-11-20 MED ORDER — POLYETHYLENE GLYCOL 3350 17 G PO PACK
17.0000 g | PACK | Freq: Once | ORAL | Status: AC
  Administered 2016-11-20: 17 g via ORAL
  Filled 2016-11-20: qty 1

## 2016-11-20 MED ORDER — ACETAMINOPHEN 650 MG RE SUPP: 650.0000 mg | Freq: Four times a day (QID) | RECTAL | Status: DC | PRN

## 2016-11-20 MED ORDER — ACETAMINOPHEN 325 MG PO TABS
650.0000 mg | ORAL_TABLET | Freq: Four times a day (QID) | ORAL | Status: DC | PRN
Start: 2016-11-20 — End: 2016-11-22

## 2016-11-20 NOTE — ED Notes (Signed)
Pt removed from the bedpan, small amount of liquid stool in the pan.  Cleaned pt up

## 2016-11-20 NOTE — ED Notes (Signed)
Helped pt to bedpan as she had a sudden urge to have diarrhea while doing orthostatics.  Pt cbg was 172 for ems

## 2016-11-20 NOTE — ED Triage Notes (Signed)
Pt from home where she lives alone and has family check in on her.  Pt has had diarrhea for 2 days and family wanted her to be evaluated.  Family told EMS that pt has been more tired, has not been answering the phone.  Family told ems that pt had a recent problem with constipation and was given a laxative, it is unclear when exactly that was.  Pt reports intermittent pain in lower pain.  No pain or burning with urination.  Pt is alert and oriented

## 2016-11-20 NOTE — ED Notes (Signed)
Pt has requested to be on the bedpan for most of the time that she has been here.  Pt has not made any formed stool but has had continued leaking of [place] liquid stool.  Pt cleaned up x3 and new chuck placed under her

## 2016-11-20 NOTE — ED Notes (Addendum)
Have tried to get a urine on pt 3 times

## 2016-11-20 NOTE — ED Notes (Signed)
Pt remains on her bedpan per her wishes.  She has not had a BM since arrival but prefers to stay on the bedpan at this time

## 2016-11-20 NOTE — ED Notes (Signed)
Pt still can't void

## 2016-11-20 NOTE — ED Provider Notes (Signed)
[place] DEPT [facility] Provider Note   CSN: [id number] Arrival date & time: 11/20/16  1416     History   Chief Complaint Chief Complaint  Patient presents with  . Diarrhea    HPI Pam [name] is a 83 y.o. female.  HPI Patient presents with 6 days of diarrhea after taking laxative. She's had multiple episodes daily. No melanotic or grossly bloody stools. She's had episodic lower abdominal and low back pain. Currently denies any pain. States she's had fatigue and generalized weakness. One episode of vomiting. Denies any chest pain, shortness of breath or cough. No urinary symptoms including hematuria, dysuria or frequency. No fever or chills.   Past Medical History:  Diagnosis Date  . Anemia   . Ankle edema   . Atrial fibrillation ([place])   . CAD (coronary artery disease)   . Carotid bruit    bilateral  . Chronic kidney disease   . Diverticulosis   . GERD (gastroesophageal reflux disease)   . History of blood transfusion    "probably related to anemia"  . History of kidney stones   . Hypercholesterolemia   . Hypertension   . Hypoparathyroidism ([place])   . Osteoporosis   . PAD (peripheral artery disease) ([place])   . Personal history of colonic polyps 05/01/2001   hyperplastic  . Spinal stenosis   . Vitamin D deficiency     Patient Active Problem List   Diagnosis Date Noted  . Constipation 11/21/2016  . Uremia 11/20/2016  . Impacted stool in rectum ([place]) 11/20/2016  . CKD (chronic kidney disease), stage IV ([place]) 11/20/2016  . PAD (peripheral artery disease) ([place]) 07/01/2016  . Abdominal aortic aneurysm without rupture ([place]) 04/18/2014  . PVD (peripheral vascular disease) ([place]) 03/20/2014  . Ankle edema 01/15/2013  . ARTHRITIS, KNEE 05/16/2009  . VULVAR ABSCESS 05/02/2009  . Normocytic anemia 08/09/2008  . FECAL OCCULT BLOOD 08/09/2008  . VITAMIN D DEFICIENCY 08/08/2008  . HYPERLIPIDEMIA 08/08/2008  . Essential hypertension 08/08/2008  . FIBRILLATION, ATRIAL  08/08/2008  . CAROTID ARTERY DISEASE 08/08/2008  . GERD 08/08/2008  . HIATAL HERNIA 08/08/2008  . DIVERTICULOSIS, COLON 08/08/2008  . GASTROINTESTINAL HEMORRHAGE 08/08/2008  . RENAL DISEASE, CHRONIC 08/08/2008  . SPINAL STENOSIS 08/08/2008  . OSTEOPOROSIS 08/08/2008  . HYPERPARATHYROIDISM, HX OF 08/08/2008  . COLONIC POLYPS, HYPERPLASTIC, HX OF 08/08/2008  . RENAL CALCULUS, HX OF 08/08/2008    Past Surgical History:  Procedure Laterality Date  . ABDOMINAL AORTAGRAM N/A 05/03/2014   Procedure: ABDOMINAL Maxcine Ham;  Surgeon: Elam Dutch, MD;  Location: [facility] CATH LAB;  Service: Cardiovascular;  Laterality: N/A;  . ABDOMINAL HYSTERECTOMY    . APPENDECTOMY    . CARDIOVERSION  01/04/2003  . carotid duplex  01/10/2012   less than 50% bilat disease  . CATARACT EXTRACTION, BILATERAL Bilateral   . COLONOSCOPY    . KIDNEY STONE SURGERY Bilateral    Removal of bilateral kidney stones; "they cut me open"  . PERIPHERAL VASCULAR CATHETERIZATION N/A 07/02/2016   Procedure: Abdominal Aortogram;  Surgeon: Elam Dutch, MD;  Location: [place] CV LAB;  Service: Cardiovascular;  Laterality: N/A;  . PERIPHERAL VASCULAR CATHETERIZATION Bilateral 07/02/2016   Procedure: Lower Extremity Angiography;  Surgeon: Elam Dutch, MD;  Location: [place] CV LAB;  Service: Cardiovascular;  Laterality: Bilateral;  LIMITED TO ILIACS  . PERIPHERAL VASCULAR CATHETERIZATION Right 07/02/2016   Procedure: Peripheral Vascular Intervention;  Surgeon: Elam Dutch, MD;  Location: [place] CV LAB;  Service: Cardiovascular;  Laterality: Right;  COMMON AND EXTERNAL ILIACS  STENTS X 3  . POLYPECTOMY    . TRANSTHORACIC ECHOCARDIOGRAM  03/2004   EF normal; RA mod-severely dilated, LA mod dilated; mild MR, mild TR; aortic valve mildly sclerotic; mild pulm valve regurg    OB History    No data available       Home Medications    Prior to Admission medications   Medication Sig Start Date End Date Taking?  Authorizing Provider  B Complex-C-Iron (B COMPLEX-IRON PO) Take 2 capsules by mouth daily.   Yes [provider]  hydrochlorothiazide (MICROZIDE) 12.5 MG capsule Take 1 capsule (12.5 mg total) by mouth daily. 07/05/16  Yes Alvia Grove, PA-C  lubiprostone (AMITIZA) 24 MCG capsule Take 24 mcg by mouth 2 (two) times daily with a meal.   Yes [provider]  metoprolol tartrate (LOPRESSOR) 25 MG tablet Take 25 mg by mouth 2 (two) times daily.  06/18/16  Yes [provider]  torsemide (DEMADEX) 10 MG tablet Take 1 tablet (10 mg total) by mouth daily. 07/05/16  Yes Virgina Jock A, PA-C  trolamine salicylate (ASPERCREME) 10 % cream Apply 1 application topically as needed for muscle pain.   Yes [provider]    Family History Family History  Problem Relation Age of Onset  . Heart disease Mother   . Heart disease Father   . Colon cancer Neg Hx     Social History Social History  Substance Use Topics  . Smoking status: Former Smoker    Packs/day: 0.12    Years: 65.00    Types: Cigarettes    Quit date: 08/01/2014  . Smokeless tobacco: Never Used     Comment: "started smoking early in my ?40s"  . Alcohol use 2.4 oz/week    4 Shots of liquor per week     Comment: 07/01/2016 "I drink boubon on the weekend"     Allergies   Patient has no known allergies.   Review of Systems Review of Systems  Constitutional: Positive for appetite change and fatigue. Negative for chills and fever.  HENT: Negative for congestion, sinus pressure and sore throat.   Eyes: Negative for visual disturbance.  Respiratory: Negative for cough and shortness of breath.   Cardiovascular: Negative for chest pain.  Gastrointestinal: Positive for abdominal pain, diarrhea, nausea and vomiting. Negative for blood in stool and constipation.  Genitourinary: Negative for dysuria, flank pain, frequency, hematuria and pelvic pain.  Musculoskeletal: Positive for back pain and myalgias.  Negative for arthralgias, neck pain and neck stiffness.  Skin: Negative for rash and wound.  Neurological: Positive for weakness (generalized). Negative for dizziness, light-headedness, numbness and headaches.  All other systems reviewed and are negative.    Physical Exam Updated Vital Signs BP (!) 157/60 (BP Location: Right Arm)   Pulse 79   Temp 97.4 F (36.3 C) (Oral)   Resp 18   Wt 53.1 kg (117 lb)   SpO2 97%   BMI 21.40 kg/m   Physical Exam  Constitutional: She is oriented to person, place, and time. She appears well-developed and well-nourished. No distress.  Frail-appearing  HENT:  Head: Normocephalic and atraumatic.  Mouth/Throat: No oropharyngeal exudate.  Dry MM  Eyes: Conjunctivae and EOM are normal. Pupils are equal, round, and reactive to light.  Neck: Normal range of motion. Neck supple.  Cardiovascular: Exam reveals no gallop and no friction rub.   No murmur heard. Irregular irregular, tachycardia  Pulmonary/Chest: Effort normal and breath sounds normal. No respiratory  distress. She has no wheezes. She has no rales. She exhibits no tenderness.  Abdominal: Soft. Bowel sounds are normal. There is no tenderness. There is no rebound and no guarding.  Musculoskeletal: Normal range of motion. She exhibits no edema or tenderness.  No midline thoracic or lumbar tenderness. No CVA tenderness.  Neurological: She is alert and oriented to person, place, and time.  5/5 motor in all extremities. Sensation grossly intact.  Skin: Skin is warm and dry. Capillary refill takes less than 2 seconds. No rash noted. No erythema.  Psychiatric: She has a normal mood and affect. Her behavior is normal.  Nursing note and vitals reviewed.    ED Treatments / Results  Labs (all labs ordered are listed, but only abnormal results are displayed) Labs Reviewed  C DIFFICILE QUICK SCREEN W PCR REFLEX - Abnormal; Notable for the following:       Result Value   C Diff antigen POSITIVE (*)     All other components within normal limits  CLOSTRIDIUM DIFFICILE BY PCR - Abnormal; Notable for the following:    Toxigenic C Difficile by pcr POSITIVE (*)    All other components within normal limits  COMPREHENSIVE METABOLIC PANEL - Abnormal; Notable for the following:    Sodium 134 (*)    Chloride 94 (*)    Glucose, Bld 124 (*)    BUN 117 (*)    Creatinine, Ser 3.16 (*)    Calcium 10.7 (*)    Total Bilirubin 2.0 (*)    GFR calc non Af Amer 12 (*)    GFR calc Af Amer 14 (*)    All other components within normal limits  CBC - Abnormal; Notable for the following:    WBC 17.8 (*)    HCT 35.7 (*)    MCV 75.8 (*)    All other components within normal limits  CBC - Abnormal; Notable for the following:    WBC 14.0 (*)    RBC 3.66 (*)    Hemoglobin 9.8 (*)    HCT 27.8 (*)    MCV 76.0 (*)    All other components within normal limits  COMPREHENSIVE METABOLIC PANEL - Abnormal; Notable for the following:    BUN 99 (*)    Creatinine, Ser 2.44 (*)    Total Protein 6.0 (*)    Albumin 3.0 (*)    Total Bilirubin 1.7 (*)    GFR calc non Af Amer 16 (*)    GFR calc Af Amer 19 (*)    All other components within normal limits  BILIRUBIN, FRACTIONATED(TOT/DIR/INDIR) - Abnormal; Notable for the following:    Total Bilirubin 1.9 (*)    Indirect Bilirubin 1.6 (*)    All other components within normal limits  GASTROINTESTINAL PANEL BY PCR, STOOL (REPLACES STOOL CULTURE)  LIPASE, BLOOD  URINALYSIS, ROUTINE W REFLEX MICROSCOPIC  OCCULT BLOOD X 1 CARD TO LAB, STOOL  BASIC METABOLIC PANEL  CBC    EKG  EKG Interpretation  Date/Time:  Saturday November 20 2016 15:44:17 EDT Ventricular Rate:  115 PR Interval:    QRS Duration: 77 QT Interval:  283 QTC Calculation: 392 R Axis:   -21 Text Interpretation:  Atrial fibrillation Borderline left axis deviation Nonspecific repol abnormality, diffuse leads When compared with ECG of 05/03/2014, Nonspecific ST and T wave abnormality is now present  Confirmed by Delora Fuel ([id number]) on 11/21/2016 11:49:25 AM       Radiology Ct Abdomen Pelvis Wo Contrast  Result Date: 11/20/2016 CLINICAL  DATA:  Diarrhea for 2 days. Fatigue. Intermittent lower abdomen pain. EXAM: CT ABDOMEN AND PELVIS WITHOUT CONTRAST TECHNIQUE: Multidetector CT imaging of the abdomen and pelvis was performed following the standard protocol without IV contrast. COMPARISON:  CT abdomen dated 11/29/2011. FINDINGS: Lower chest: No acute findings. Hepatobiliary: No focal liver abnormality is seen. No gallstones, gallbladder wall thickening, or biliary dilatation. Pancreas: Unremarkable. No pancreatic ductal dilatation or surrounding inflammatory changes. Spleen: Normal in size without focal abnormality. Adrenals/Urinary Tract: Adrenal glands are unremarkable. Multiple bilateral renal cysts. No renal stone or hydronephrosis. No perinephric fluid. No ureteral or bladder calculi identified. Bladder is unremarkable. Stomach/Bowel: Large stool ball distending the rectal vault. Remainder of the bowel is normal in caliber. Scattered diverticulosis within the descending and sigmoid colon without evidence of acute diverticulitis. Vascular/Lymphatic: Heavy atherosclerosis of the abdominal aorta, aortic branch vessels and pelvic vasculature. No enlarged lymph nodes seen in the abdomen or pelvis. Reproductive: Status post hysterectomy.  No adnexal mass. Other: No free fluid or abscess collections seen. No free intraperitoneal air. Musculoskeletal: Degenerative changes of the scoliotic thoracolumbar spine, at least moderate in degree. No acute or suspicious osseous finding. Superficial soft tissues are unremarkable. IMPRESSION: 1. Large stool ball distending the rectal vault suggesting impaction/constipation. No CT evidence of constipation within the remainder of the normal-caliber colon. 2. Colonic diverticulosis without evidence of acute diverticulitis. 3. Aortic atherosclerosis. 4. Additional  chronic/incidental findings detailed above. Electronically Signed   By: Franki Cabot M.D.   On: 11/20/2016 18:26    Procedures Procedures (including critical care time)  Medications Ordered in ED Medications  metoprolol tartrate (LOPRESSOR) tablet 25 mg (25 mg Oral Given 11/21/16 2202)  ondansetron (ZOFRAN) tablet 4 mg (not administered)    Or  ondansetron (ZOFRAN) injection 4 mg (not administered)  acetaminophen (TYLENOL) tablet 650 mg (not administered)    Or  acetaminophen (TYLENOL) suppository 650 mg (not administered)  enoxaparin (LOVENOX) injection 30 mg (30 mg Subcutaneous Given 11/21/16 2202)  0.9 %  sodium chloride infusion ( Intravenous New Bag/Given 11/21/16 1303)  sodium chloride 0.9 % bolus 500 mL (0 mLs Intravenous Stopped 11/20/16 1854)  iopamidol (ISOVUE-300) 61 % injection (30 mLs Oral Contrast Given 11/20/16 1809)  polyethylene glycol (MIRALAX / GLYCOLAX) packet 17 g (17 g Oral Given 11/20/16 2304)  bisacodyl (DULCOLAX) suppository 10 mg (10 mg Rectal Given 11/20/16 2304)  bisacodyl (DULCOLAX) suppository 10 mg (10 mg Rectal Given 11/21/16 1303)     Initial Impression / Assessment and Plan / ED Course  I have reviewed the triage vital signs and the nursing notes.  Pertinent labs & imaging results that were available during my care of the patient were reviewed by me and considered in my medical decision making (see chart for details).     Discussed with hospitalist and will admit. C. difficile is pending.  Final Clinical Impressions(s) / ED Diagnoses   Final diagnoses:  Uremia  Diarrhea, unspecified type  Generalized weakness    New Prescriptions Current Discharge Medication List       Julianne Rice, MD 11/21/16 2321

## 2016-11-20 NOTE — H&P (Signed)
History and Physical  Patient Name: Pam [name]     QPY:[id number]    DOB: 08-27-1925    DOA: 11/20/2016 PCP: [name], MD  Patient coming from: Home  Chief Complaint: Weakness diarrhea      HPI: Pam [name] is a 83 y.o. female with a past medical history significant for CKD IV-V not on HD, HTN, Afib not on AC, and PVD who presents with diarrhea and weakness.  THe patient recently had some joint pain for which she was prescribed prednisone, then developed bad constipation.  Around this same time, she developed nausea, decreased PO intake, and one episode NBNB emesis.  She takes Amitiza PRN and so she took several doses a few days ago and then for the last few days has had numerous soft or liquid BMs.  By yesterday she was having BMs "all day" can't count how many.  Never blood or mucus.  No fever.  No abdominal cramping.  No history of Cdiff, no recent antibiotics.  ED course: -Afebrile, heart rate 100, respirations and pulse ox normal, BP 154/92 -Na 134, K 4.2, Cr 3.16 (baseline 2.8), WBC 17.8K, Hgb 12.7 and microcytic, improved from previous -BUN 117 -FOBT negative, soft stool on rectal exam -UA clear -Lipase normal -CT abdomen wo contrast showed stool impaction in the rectal vault, no evidence of colitis, limited exam without contrast -Cdiff panel was sent and TRH were asked to observe for uremia, constipation failed outpatient hterapy          ROS: Review of Systems  Constitutional: Positive for malaise/fatigue. Negative for chills and fever.  Gastrointestinal: Positive for constipation, diarrhea and vomiting. Negative for abdominal pain and melena.  Neurological: Positive for weakness.  All other systems reviewed and are negative.         Past Medical History:  Diagnosis Date  . Anemia   . Ankle edema   . Atrial fibrillation ([place])   . CAD (coronary artery disease)   . Carotid bruit    bilateral  . Chronic kidney disease   . Diverticulosis   . GERD  (gastroesophageal reflux disease)   . History of blood transfusion    "probably related to anemia"  . History of kidney stones   . Hypercholesterolemia   . Hypertension   . Hypoparathyroidism ([place])   . Osteoporosis   . PAD (peripheral artery disease) ([place])   . Personal history of colonic polyps 05/01/2001   hyperplastic  . Spinal stenosis   . Vitamin D deficiency     Past Surgical History:  Procedure Laterality Date  . ABDOMINAL AORTAGRAM N/A 05/03/2014   Procedure: ABDOMINAL Maxcine Ham;  Surgeon: Elam Dutch, MD;  Location: [facility] CATH LAB;  Service: Cardiovascular;  Laterality: N/A;  . ABDOMINAL HYSTERECTOMY    . APPENDECTOMY    . CARDIOVERSION  01/04/2003  . carotid duplex  01/10/2012   less than 50% bilat disease  . CATARACT EXTRACTION, BILATERAL Bilateral   . COLONOSCOPY    . KIDNEY STONE SURGERY Bilateral    Removal of bilateral kidney stones; "they cut me open"  . PERIPHERAL VASCULAR CATHETERIZATION N/A 07/02/2016   Procedure: Abdominal Aortogram;  Surgeon: Elam Dutch, MD;  Location: [place] CV LAB;  Service: Cardiovascular;  Laterality: N/A;  . PERIPHERAL VASCULAR CATHETERIZATION Bilateral 07/02/2016   Procedure: Lower Extremity Angiography;  Surgeon: Elam Dutch, MD;  Location: [place] CV LAB;  Service: Cardiovascular;  Laterality: Bilateral;  LIMITED TO ILIACS  . PERIPHERAL VASCULAR CATHETERIZATION Right 07/02/2016  Procedure: Peripheral Vascular Intervention;  Surgeon: Elam Dutch, MD;  Location: [place] CV LAB;  Service: Cardiovascular;  Laterality: Right;  COMMON AND EXTERNAL ILIACS  STENTS X 3  . POLYPECTOMY    . TRANSTHORACIC ECHOCARDIOGRAM  03/2004   EF normal; RA mod-severely dilated, LA mod dilated; mild MR, mild TR; aortic valve mildly sclerotic; mild pulm valve regurg    Social History: The patient walks with a wheeled walker.  Nonsmoker.  No Known Allergies  Family history: family history includes Heart disease in her father and  mother.  Prior to Admission medications   Medication Sig Start Date End Date Taking? Authorizing Provider  B Complex-C-Iron (B COMPLEX-IRON PO) Take 2 capsules by mouth daily.   Yes [provider]  hydrochlorothiazide (MICROZIDE) 12.5 MG capsule Take 1 capsule (12.5 mg total) by mouth daily. 07/05/16  Yes Alvia Grove, PA-C  lubiprostone (AMITIZA) 24 MCG capsule Take 24 mcg by mouth 2 (two) times daily with a meal.   Yes [provider]  metoprolol tartrate (LOPRESSOR) 25 MG tablet Take 25 mg by mouth 2 (two) times daily.  06/18/16  Yes [provider]  torsemide (DEMADEX) 10 MG tablet Take 1 tablet (10 mg total) by mouth daily. 07/05/16  Yes Virgina Jock A, PA-C  trolamine salicylate (ASPERCREME) 10 % cream Apply 1 application topically as needed for muscle pain.   Yes [provider]       Physical Exam: BP 140/90 (BP Location: Right Arm)   Pulse 98   Temp 98.3 F (36.8 C) (Oral)   Resp 16   Wt 53.1 kg (117 lb)   SpO2 96%   BMI 21.40 kg/m  General appearance: Thin frail elderly adult female, alert and in no acute distress.   Eyes: Anicteric, conjunctiva pink, lids and lashes normal. PERRL.    ENT: No nasal deformity, discharge, epistaxis.  Hearing normal. OP tacky dry without lesions.   Neck: No neck masses.  Trachea midline.  No thyromegaly/tenderness. Lymph: No cervical or supraclavicular lymphadenopathy. Skin: Warm and dry.  No suspicious rashes or lesions. Cardiac: RRR, nl S1-S2, no murmurs appreciated.  Capillary refill is brisk. Respiratory: Normal respiratory rate and rhythm.  CTAB without rales or wheezes. Abdomen: Abdomen soft.  No TTP. No ascites, distension, hepatosplenomegaly.   MSK: No deformities or effusions.  No cyanosis or clubbing.  Diffuse loss of muscle mass and Subq fat. Neuro: Cranial nerves normal.  Sensation intact to light touch. Speech is fluent.  Muscle strength globally weak.    Psych: Sensorium intact and  responding to questions, attention normal.  Behavior appropriate.  Affect blunted.  Judgment and insight appear normal for age.     Labs on Admission:  I have personally reviewed following labs and imaging studies: CBC:  Recent Labs Lab 11/20/16 1605  WBC 17.8*  HGB 12.7  HCT 35.7*  MCV 75.8*  PLT 470   Basic Metabolic Panel:  Recent Labs Lab 11/20/16 1605  NA 134*  K 4.2  CL 94*  CO2 25  GLUCOSE 124*  BUN 117*  CREATININE 3.16*  CALCIUM 10.7*   GFR: Estimated Creatinine Clearance: 9.5 mL/min (A) (by C-G formula based on SCr of 3.16 mg/dL (H)).  Liver Function Tests:  Recent Labs Lab 11/20/16 1605  AST 23  ALT 19  ALKPHOS 74  BILITOT 2.0*  PROT 7.6  ALBUMIN 3.9    Recent Labs Lab 11/20/16 1605  LIPASE 39   Recent Results (from the past 240 hour(s))  C difficile quick scan w PCR reflex     Status: Abnormal   Collection Time: 11/20/16  3:32 PM  Result Value Ref Range Status   C Diff antigen POSITIVE (A) NEGATIVE Final   C Diff toxin NEGATIVE NEGATIVE Final   C Diff interpretation Results are indeterminate. See PCR results.  Final         Radiological Exams on Admission: Personally reviewed CT report: Ct Abdomen Pelvis Wo Contrast  Result Date: 11/20/2016 CLINICAL DATA:  Diarrhea for 2 days. Fatigue. Intermittent lower abdomen pain. EXAM: CT ABDOMEN AND PELVIS WITHOUT CONTRAST TECHNIQUE: Multidetector CT imaging of the abdomen and pelvis was performed following the standard protocol without IV contrast. COMPARISON:  CT abdomen dated 11/29/2011. FINDINGS: Lower chest: No acute findings. Hepatobiliary: No focal liver abnormality is seen. No gallstones, gallbladder wall thickening, or biliary dilatation. Pancreas: Unremarkable. No pancreatic ductal dilatation or surrounding inflammatory changes. Spleen: Normal in size without focal abnormality. Adrenals/Urinary Tract: Adrenal glands are unremarkable. Multiple bilateral renal cysts. No renal stone or  hydronephrosis. No perinephric fluid. No ureteral or bladder calculi identified. Bladder is unremarkable. Stomach/Bowel: Large stool ball distending the rectal vault. Remainder of the bowel is normal in caliber. Scattered diverticulosis within the descending and sigmoid colon without evidence of acute diverticulitis. Vascular/Lymphatic: Heavy atherosclerosis of the abdominal aorta, aortic branch vessels and pelvic vasculature. No enlarged lymph nodes seen in the abdomen or pelvis. Reproductive: Status post hysterectomy.  No adnexal mass. Other: No free fluid or abscess collections seen. No free intraperitoneal air. Musculoskeletal: Degenerative changes of the scoliotic thoracolumbar spine, at least moderate in degree. No acute or suspicious osseous finding. Superficial soft tissues are unremarkable. IMPRESSION: 1. Large stool ball distending the rectal vault suggesting impaction/constipation. No CT evidence of constipation within the remainder of the normal-caliber colon. 2. Colonic diverticulosis without evidence of acute diverticulitis. 3. Aortic atherosclerosis. 4. Additional chronic/incidental findings detailed above. Electronically Signed   By: Franki Cabot M.D.   On: 11/20/2016 18:26    EKG: Independently reviewed. Rate 115, QTc normal, A fib.         Assessment/Plan  1. Constipation and diarrhea:  Imaging seems to suggest a fecal impaction not appreciated on exam. Presume this is some peri-impaction fecal leakage.  Cdiff testing shows colonization, but in the setting of laxative use and abscence of cramping, recent antibiotics, and toxin negative, I feel this is less likely at this stage to represent Cdiff colitis. -Dulcolax suppository tnoight and Miralax -If no BM in AM, consider repeat Miralax vs lactulose vs SMOG -Follow up stool panel -Hold Amitixa for now   2. Uremia in chronic kidney disease stage IV:  Presume uremia is exacerbated by impaction. -Gentle IVF overnight -Relieve  impaction  3. Hyponatremia:  Mild -Trend electrolytes  4. Elevated total bilirubin:  Unclear cause -Fractionate, trend  5. Iron deficiency anemia:  -Hold iron until constipation was also  6. Hypertension:  -Hold torsemide while hydrating -Continue metoprolol and HCTZ      DVT prophylaxis: Lovenox, low-dose  Code Status: Full code  Family Communication: Niece at bedside  Disposition Plan: Anticipate disimpaction with suppository and MiraLAX, then hopefully home after that Consults called: None Admission status: OBS At the point of initial evaluation, it is my clinical opinion that admission for OBSERVATION is reasonable and necessary because the patient's presenting complaints in the context of their chronic conditions represent sufficient risk of deterioration or significant morbidity to constitute reasonable grounds for close observation in the hospital setting, but  that the patient may be medically stable for discharge from the hospital within 24 to 48 hours.    Medical decision making: Patient seen at 8:00 PM on 11/20/2016.  The patient was discussed with Dr. Lita Mains.  What exists of the patient's chart was reviewed in depth and summarized above.  Clinical condition: stable.        Edwin Dada [facility] Pager [phone]

## 2016-11-20 NOTE — ED Notes (Signed)
Pt is in afib.  Placed pt on monitor

## 2016-11-20 NOTE — ED Notes (Signed)
Bed: WA03 Expected date:  Expected time:  Means of arrival:  Comments: 83 yo diarrhea

## 2016-11-21 DIAGNOSIS — E559 Vitamin D deficiency, unspecified: Secondary | ICD-10-CM | POA: Diagnosis present

## 2016-11-21 DIAGNOSIS — M810 Age-related osteoporosis without current pathological fracture: Secondary | ICD-10-CM | POA: Diagnosis present

## 2016-11-21 DIAGNOSIS — Z87891 Personal history of nicotine dependence: Secondary | ICD-10-CM | POA: Diagnosis not present

## 2016-11-21 DIAGNOSIS — K5641 Fecal impaction: Secondary | ICD-10-CM | POA: Diagnosis present

## 2016-11-21 DIAGNOSIS — Z90710 Acquired absence of both cervix and uterus: Secondary | ICD-10-CM | POA: Diagnosis not present

## 2016-11-21 DIAGNOSIS — Z8249 Family history of ischemic heart disease and other diseases of the circulatory system: Secondary | ICD-10-CM | POA: Diagnosis not present

## 2016-11-21 DIAGNOSIS — N184 Chronic kidney disease, stage 4 (severe): Secondary | ICD-10-CM | POA: Diagnosis present

## 2016-11-21 DIAGNOSIS — Z86010 Personal history of colonic polyps: Secondary | ICD-10-CM | POA: Diagnosis not present

## 2016-11-21 DIAGNOSIS — E785 Hyperlipidemia, unspecified: Secondary | ICD-10-CM | POA: Diagnosis present

## 2016-11-21 DIAGNOSIS — R52 Pain, unspecified: Secondary | ICD-10-CM | POA: Diagnosis present

## 2016-11-21 DIAGNOSIS — K5730 Diverticulosis of large intestine without perforation or abscess without bleeding: Secondary | ICD-10-CM | POA: Diagnosis present

## 2016-11-21 DIAGNOSIS — D509 Iron deficiency anemia, unspecified: Secondary | ICD-10-CM | POA: Diagnosis present

## 2016-11-21 DIAGNOSIS — K219 Gastro-esophageal reflux disease without esophagitis: Secondary | ICD-10-CM | POA: Diagnosis present

## 2016-11-21 DIAGNOSIS — I129 Hypertensive chronic kidney disease with stage 1 through stage 4 chronic kidney disease, or unspecified chronic kidney disease: Secondary | ICD-10-CM | POA: Diagnosis present

## 2016-11-21 DIAGNOSIS — N179 Acute kidney failure, unspecified: Secondary | ICD-10-CM | POA: Diagnosis present

## 2016-11-21 DIAGNOSIS — K5900 Constipation, unspecified: Secondary | ICD-10-CM | POA: Diagnosis present

## 2016-11-21 DIAGNOSIS — K5909 Other constipation: Secondary | ICD-10-CM | POA: Diagnosis not present

## 2016-11-21 DIAGNOSIS — I2510 Atherosclerotic heart disease of native coronary artery without angina pectoris: Secondary | ICD-10-CM | POA: Diagnosis present

## 2016-11-21 DIAGNOSIS — I739 Peripheral vascular disease, unspecified: Secondary | ICD-10-CM | POA: Diagnosis present

## 2016-11-21 DIAGNOSIS — I4891 Unspecified atrial fibrillation: Secondary | ICD-10-CM | POA: Diagnosis present

## 2016-11-21 DIAGNOSIS — E871 Hypo-osmolality and hyponatremia: Secondary | ICD-10-CM | POA: Diagnosis present

## 2016-11-21 DIAGNOSIS — Z9842 Cataract extraction status, left eye: Secondary | ICD-10-CM | POA: Diagnosis not present

## 2016-11-21 DIAGNOSIS — Z9841 Cataract extraction status, right eye: Secondary | ICD-10-CM | POA: Diagnosis not present

## 2016-11-21 LAB — COMPREHENSIVE METABOLIC PANEL
ALT: 15 U/L (ref 14–54)
AST: 18 U/L (ref 15–41)
Albumin: 3 g/dL — ABNORMAL LOW (ref 3.5–5.0)
Alkaline Phosphatase: 56 U/L (ref 38–126)
Anion gap: 10 (ref 5–15)
BILIRUBIN TOTAL: 1.7 mg/dL — AB (ref 0.3–1.2)
BUN: 99 mg/dL — AB (ref 6–20)
CHLORIDE: 102 mmol/L (ref 101–111)
CO2: 24 mmol/L (ref 22–32)
CREATININE: 2.44 mg/dL — AB (ref 0.44–1.00)
Calcium: 9.6 mg/dL (ref 8.9–10.3)
GFR calc Af Amer: 19 mL/min — ABNORMAL LOW (ref >60)
GFR, EST NON AFRICAN AMERICAN: 16 mL/min — AB (ref >60)
Glucose, Bld: 86 mg/dL (ref 65–99)
Potassium: 3.6 mmol/L (ref 3.5–5.1)
Sodium: 136 mmol/L (ref 135–145)
TOTAL PROTEIN: 6 g/dL — AB (ref 6.5–8.1)

## 2016-11-21 LAB — CBC
HEMATOCRIT: 27.8 % — AB (ref 36.0–46.0)
Hemoglobin: 9.8 g/dL — ABNORMAL LOW (ref 12.0–15.0)
MCH: 26.8 pg (ref 26.0–34.0)
MCHC: 35.3 g/dL (ref 30.0–36.0)
MCV: 76 fL — ABNORMAL LOW (ref 78.0–100.0)
Platelets: 160 10*3/uL (ref 150–400)
RBC: 3.66 MIL/uL — AB (ref 3.87–5.11)
RDW: 14.1 % (ref 11.5–15.5)
WBC: 14 10*3/uL — AB (ref 4.0–10.5)

## 2016-11-21 LAB — BILIRUBIN, FRACTIONATED(TOT/DIR/INDIR)
BILIRUBIN TOTAL: 1.9 mg/dL — AB (ref 0.3–1.2)
Bilirubin, Direct: 0.3 mg/dL (ref 0.1–0.5)
Indirect Bilirubin: 1.6 mg/dL — ABNORMAL HIGH (ref 0.3–0.9)

## 2016-11-21 LAB — CLOSTRIDIUM DIFFICILE BY PCR: Toxigenic C. Difficile by PCR: POSITIVE — AB

## 2016-11-21 MED ORDER — BISACODYL 10 MG RE SUPP
10.0000 mg | Freq: Once | RECTAL | Status: AC
  Administered 2016-11-21: 10 mg via RECTAL
  Filled 2016-11-21: qty 1

## 2016-11-21 MED ORDER — SODIUM CHLORIDE 0.9 % IV SOLN
INTRAVENOUS | Status: DC
  Administered 2016-11-21 – 2016-11-22 (×2): via INTRAVENOUS

## 2016-11-21 MED ORDER — METRONIDAZOLE IN NACL 5-0.79 MG/ML-% IV SOLN
500.0000 mg | Freq: Two times a day (BID) | INTRAVENOUS | Status: DC
  Administered 2016-11-21: 500 mg via INTRAVENOUS
  Filled 2016-11-21 (×2): qty 100

## 2016-11-21 NOTE — Progress Notes (Signed)
Phone call from [facility] stating they have a positive C-Diff on the patient. Results sent to the Dr.

## 2016-11-21 NOTE — Progress Notes (Signed)
PROGRESS NOTE    Pam [name]  WFU:[id number] DOB: January 03, 1926 DOA: 11/20/2016 PCP: [name], MD     Brief Narrative:  Pam [name] is a 83 y.o. female with a past medical history significant for CKD IV-V not on HD, HTN, Afib not on AC, and PVD who presents with diarrhea and weakness. The patient recently had some joint pain for which she was prescribed prednisone, then developed bad constipation. Around this same time, she developed nausea, decreased PO intake, and one episode NBNB emesis. She takes Amitiza PRN and so she took several doses a few days ago and then for the last few days has had numerous soft or liquid BMs.  By yesterday she was having BMs "all day" can't count how many. Never blood or mucus.  No fever.  No abdominal cramping.  No history of Cdiff, no recent antibiotics. CT abdomen showed stool impaction in the rectal vault and C Diff panel consistent with colonization.   Assessment & Plan:   Principal Problem:   Uremia Active Problems:   Normocytic anemia   Essential hypertension   FIBRILLATION, ATRIAL   PVD (peripheral vascular disease) ([facility])   Impacted stool in rectum ([facility])   CKD (chronic kidney disease), stage IV ([facility])   Constipation and diarrhea -CT abdomen showed fecal impaction, diarrhea likely to be peri-impaction fecal leakage -Suppository and disimpaction prn   C Diff colonization -C. difficile toxin was negative. Patient does not have any clinical signs of C. difficile colitis. Denies any abdominal cramping, fevers. Abdominal exam is unremarkable. No recent antibiotic use. -Stop Flagyl  Leukocytosis -Likely hemoconcentration, no clinical report consistent with infectious process. UA negative.   AKI on CKD stage IV -IVF, improving  -Hold HCTZ, torsemide   HTN -Lopressor     DVT prophylaxis: lovenox Code Status: full Family Communication: no family at bedside Disposition Plan: pending improvement    Consultants:   None  Procedures:    None  Antimicrobials:  Anti-infectives    Start     Dose/Rate Route Frequency Ordered Stop   11/21/16 0230  metroNIDAZOLE (FLAGYL) IVPB 500 mg  Status:  Discontinued     500 mg 100 mL/hr over 60 Minutes Intravenous 2 times daily 11/21/16 0222 11/21/16 1014       Subjective: No complaints today. States that she is feeling well, although still constipated. No abdominal pains, no fevers at home, no chills or rigors. Denies any shortness of breath, chest pain, cough, nausea or vomiting.  Objective: Vitals:   11/20/16 2052 11/20/16 2126 11/21/16 0536 11/21/16 1100  BP:  140/90 110/62 (!) 112/52  Pulse:  98 63 75  Resp: 17 16 18 18   Temp:  98.3 F (36.8 C) 97.8 F (36.6 C) 98.3 F (36.8 C)  TempSrc:  Oral Oral Oral  SpO2:  96% 94% 100%  Weight:        Intake/Output Summary (Last 24 hours) at 11/21/16 1227 Last data filed at 11/21/16 0600  Gross per 24 hour  Intake           1622.5 ml  Output                0 ml  Net           1622.5 ml   Filed Weights   11/20/16 1423  Weight: 53.1 kg (117 lb)    Examination:  General exam: Appears calm and comfortable  Respiratory system: Clear to auscultation. Respiratory effort normal. Cardiovascular system: S1 & S2 heard,  RRR. No JVD, murmurs, rubs, gallops or clicks. No pedal edema. Gastrointestinal system: Abdomen is nondistended, soft and nontender. No organomegaly or masses felt. Normal bowel sounds heard. Central nervous system: Alert and oriented. No focal neurological deficits. Extremities: Symmetric 5 x 5 power. Skin: No rashes, lesions or ulcers Psychiatry: Judgement and insight appear normal. Mood & affect appropriate.   Data Reviewed: I have personally reviewed following labs and imaging studies  CBC:  Recent Labs Lab 11/20/16 1605 11/21/16 0749  WBC 17.8* 14.0*  HGB 12.7 9.8*  HCT 35.7* 27.8*  MCV 75.8* 76.0*  PLT 196 517   Basic Metabolic Panel:  Recent Labs Lab 11/20/16 1605 11/21/16 0749  NA 134*  136  K 4.2 3.6  CL 94* 102  CO2 25 24  GLUCOSE 124* 86  BUN 117* 99*  CREATININE 3.16* 2.44*  CALCIUM 10.7* 9.6   GFR: Estimated Creatinine Clearance: 12.4 mL/min (A) (by C-G formula based on SCr of 2.44 mg/dL (H)). Liver Function Tests:  Recent Labs Lab 11/20/16 1605 11/21/16 0749  AST 23 18  ALT 19 15  ALKPHOS 74 56  BILITOT 2.0* 1.9*  1.7*  PROT 7.6 6.0*  ALBUMIN 3.9 3.0*    Recent Labs Lab 11/20/16 1605  LIPASE 39   No results for input(s): AMMONIA in the last 168 hours. Coagulation Profile: No results for input(s): INR, PROTIME in the last 168 hours. Cardiac Enzymes: No results for input(s): CKTOTAL, CKMB, CKMBINDEX, TROPONINI in the last 168 hours. BNP (last 3 results) No results for input(s): PROBNP in the last 8760 hours. HbA1C: No results for input(s): HGBA1C in the last 72 hours. CBG: No results for input(s): GLUCAP in the last 168 hours. Lipid Profile: No results for input(s): CHOL, HDL, LDLCALC, TRIG, CHOLHDL, LDLDIRECT in the last 72 hours. Thyroid Function Tests: No results for input(s): TSH, T4TOTAL, FREET4, T3FREE, THYROIDAB in the last 72 hours. Anemia Panel: No results for input(s): VITAMINB12, FOLATE, FERRITIN, TIBC, IRON, RETICCTPCT in the last 72 hours. Sepsis Labs: No results for input(s): PROCALCITON, LATICACIDVEN in the last 168 hours.  Recent Results (from the past 240 hour(s))  C difficile quick scan w PCR reflex     Status: Abnormal   Collection Time: 11/20/16  3:32 PM  Result Value Ref Range Status   C Diff antigen POSITIVE (A) NEGATIVE Final   C Diff toxin NEGATIVE NEGATIVE Final   C Diff interpretation Results are indeterminate. See PCR results.  Final  Clostridium Difficile by PCR     Status: Abnormal   Collection Time: 11/20/16  3:32 PM  Result Value Ref Range Status   Toxigenic C Difficile by pcr POSITIVE (A) NEGATIVE Final    Comment: Positive for toxigenic C. difficile with little to no toxin production. Only treat if  clinical presentation suggests symptomatic illness. CRITICAL RESULT CALLED TO, READ BACK BY AND VERIFIED WITH: Visser,E RN [id number] 11/21/16 MITCHELL,L Performed at [facility], [place] [address] [place], [place] [address]        Radiology Studies: Ct Abdomen Pelvis Wo Contrast  Result Date: 11/20/2016 CLINICAL DATA:  Diarrhea for 2 days. Fatigue. Intermittent lower abdomen pain. EXAM: CT ABDOMEN AND PELVIS WITHOUT CONTRAST TECHNIQUE: Multidetector CT imaging of the abdomen and pelvis was performed following the standard protocol without IV contrast. COMPARISON:  CT abdomen dated 11/29/2011. FINDINGS: Lower chest: No acute findings. Hepatobiliary: No focal liver abnormality is seen. No gallstones, gallbladder wall thickening, or biliary dilatation. Pancreas: Unremarkable. No pancreatic ductal dilatation or surrounding  inflammatory changes. Spleen: Normal in size without focal abnormality. Adrenals/Urinary Tract: Adrenal glands are unremarkable. Multiple bilateral renal cysts. No renal stone or hydronephrosis. No perinephric fluid. No ureteral or bladder calculi identified. Bladder is unremarkable. Stomach/Bowel: Large stool ball distending the rectal vault. Remainder of the bowel is normal in caliber. Scattered diverticulosis within the descending and sigmoid colon without evidence of acute diverticulitis. Vascular/Lymphatic: Heavy atherosclerosis of the abdominal aorta, aortic branch vessels and pelvic vasculature. No enlarged lymph nodes seen in the abdomen or pelvis. Reproductive: Status post hysterectomy.  No adnexal mass. Other: No free fluid or abscess collections seen. No free intraperitoneal air. Musculoskeletal: Degenerative changes of the scoliotic thoracolumbar spine, at least moderate in degree. No acute or suspicious osseous finding. Superficial soft tissues are unremarkable. IMPRESSION: 1. Large stool ball distending the rectal vault suggesting impaction/constipation. No CT evidence of  constipation within the remainder of the normal-caliber colon. 2. Colonic diverticulosis without evidence of acute diverticulitis. 3. Aortic atherosclerosis. 4. Additional chronic/incidental findings detailed above. Electronically Signed   By: Franki Cabot M.D.   On: 11/20/2016 18:26      Scheduled Meds: . bisacodyl  10 mg Rectal Once  . enoxaparin (LOVENOX) injection  30 mg Subcutaneous QHS  . metoprolol tartrate  25 mg Oral BID   Continuous Infusions: . sodium chloride       LOS: 0 days    Time spent: 40 minutes   Dessa Phi, DO [facility] [email or web address] Password [facility] 11/21/2016, 12:27 PM

## 2016-11-22 DIAGNOSIS — K5909 Other constipation: Secondary | ICD-10-CM

## 2016-11-22 LAB — BASIC METABOLIC PANEL
Anion gap: 8 (ref 5–15)
BUN: 72 mg/dL — AB (ref 6–20)
CALCIUM: 9.4 mg/dL (ref 8.9–10.3)
CO2: 22 mmol/L (ref 22–32)
CREATININE: 2.05 mg/dL — AB (ref 0.44–1.00)
Chloride: 110 mmol/L (ref 101–111)
GFR calc Af Amer: 24 mL/min — ABNORMAL LOW (ref >60)
GFR, EST NON AFRICAN AMERICAN: 20 mL/min — AB (ref >60)
GLUCOSE: 88 mg/dL (ref 65–99)
Potassium: 3.7 mmol/L (ref 3.5–5.1)
Sodium: 140 mmol/L (ref 135–145)

## 2016-11-22 LAB — CBC
HEMATOCRIT: 26.2 % — AB (ref 36.0–46.0)
Hemoglobin: 9.2 g/dL — ABNORMAL LOW (ref 12.0–15.0)
MCH: 26.9 pg (ref 26.0–34.0)
MCHC: 35.1 g/dL (ref 30.0–36.0)
MCV: 76.6 fL — ABNORMAL LOW (ref 78.0–100.0)
PLATELETS: 148 10*3/uL — AB (ref 150–400)
RBC: 3.42 MIL/uL — ABNORMAL LOW (ref 3.87–5.11)
RDW: 14.3 % (ref 11.5–15.5)
WBC: 13.4 10*3/uL — ABNORMAL HIGH (ref 4.0–10.5)

## 2016-11-22 MED ORDER — POLYETHYLENE GLYCOL 3350 17 G PO PACK: 17.0000 g | PACK | Freq: Every day | ORAL | 0 refills | Status: AC

## 2016-11-22 MED ORDER — DOCUSATE SODIUM 100 MG PO CAPS: 100.0000 mg | ORAL_CAPSULE | Freq: Two times a day (BID) | ORAL | 0 refills | Status: AC

## 2016-11-22 NOTE — Progress Notes (Signed)
Discharge instructions were gone over with patient and family members. Patient aware of prescriptions and IV site was removed. Family assisted patient to get dressed.

## 2016-11-22 NOTE — Discharge Summary (Addendum)
Physician Discharge Summary  Pam [name] GQQ:[id number] DOB: 1925-07-12 DOA: 11/20/2016  PCP: [name], MD  Admit date: 11/20/2016 Discharge date: 11/22/2016  Admitted From: Home Disposition:  Refused SNF, Home with home health services   Recommendations for Outpatient Follow-up:  1. Follow up with PCP in 1 week 2. Please obtain BMP/CBC in 1 week   Home health: PT OT RN Aide, SW  Discharge Condition: Stable CODE STATUS: Full  Diet recommendation: Heart healthy   Brief/Interim Summary: Pam [name] a 83 y.o.femalewith a past medical history significant for CKD IV-V not on HD, HTN, Afib not on AC, and PVDwho presents with diarrhea and weakness. The patient recently had some joint pain for which she was prescribed prednisone, then developed bad constipation.Around this same time, she developed nausea, decreased PO intake, and one episode NBNB emesis. She takes Amitiza PRN and so she took several doses a few days ago and then for the last few days has had numerous soft or liquid BMs. By yesterday she was having BMs "all day" can't count how many. Never blood or mucus. No fever. No abdominal cramping. No history of Cdiff, no recent antibiotics. CT abdomen showed stool impaction in the rectal vault and C Diff panel consistent with colonization. Patient's constipation resolved in the hospital, feeling much better. Acute kidney injury was also improving with IV fluids. Patient was evaluated by physical therapy who recommended SNF, but refused and elected to go home instead with home health services.    Discharge Diagnoses:  Principal Problem:   Constipation Active Problems:   Normocytic anemia   Essential hypertension   FIBRILLATION, ATRIAL   PVD (peripheral vascular disease) (HCC)   Uremia   Impacted stool in rectum (HCC)   CKD (chronic kidney disease), stage IV (HCC)  Constipation and diarrhea -CT abdomen showed fecal impaction, diarrhea likely to be peri-impaction fecal  leakage -Resolved, had large BM during hospitalization  -Bowel regimen   C Diff colonization -C. difficile toxin was negative. Patient does not have any clinical signs of C. difficile colitis. Denies any abdominal cramping, fevers. Abdominal exam is unremarkable. No recent antibiotic use. -Stop Flagyl  Leukocytosis -Likely hemoconcentration, no clinical report consistent with infectious process. UA negative. Repeat CBC as outpatient   AKI on CKD stage IV -Baseline Cr ~ 2.2-2.6  -Resolved with IVF. Resume home medications with close BMP follow up as outpatient   HTN -Lopressor     Discharge Instructions  Discharge Instructions    Diet - low sodium heart healthy    Complete by:  As directed    Discharge instructions    Complete by:  As directed    You were cared for by a hospitalist during your hospital stay. If you have any questions about your discharge medications or the care you received while you were in the hospital after you are discharged, you can call the unit and asked to speak with the hospitalist on call if the hospitalist that took care of you is not available. Once you are discharged, your primary care physician will handle any further medical issues. Please note that NO REFILLS for any discharge medications will be authorized once you are discharged, as it is imperative that you return to your primary care physician (or establish a relationship with a primary care physician if you do not have one) for your aftercare needs so that they can reassess your need for medications and monitor your lab values.   Increase activity slowly    Complete  by:  As directed      Allergies as of 11/22/2016   No Known Allergies     Medication List    TAKE these medications   B COMPLEX-IRON PO Take 2 capsules by mouth daily.   docusate sodium 100 MG capsule Commonly known as:  COLACE Take 1 capsule (100 mg total) by mouth 2 (two) times daily.   hydrochlorothiazide 12.5 MG  capsule Commonly known as:  MICROZIDE Take 1 capsule (12.5 mg total) by mouth daily.   lubiprostone 24 MCG capsule Commonly known as:  AMITIZA Take 24 mcg by mouth 2 (two) times daily with a meal.   metoprolol tartrate 25 MG tablet Commonly known as:  LOPRESSOR Take 25 mg by mouth 2 (two) times daily.   polyethylene glycol packet Commonly known as:  MIRALAX / GLYCOLAX Take 17 g by mouth daily.   torsemide 10 MG tablet Commonly known as:  DEMADEX Take 1 tablet (10 mg total) by mouth daily.   trolamine salicylate 10 % cream Commonly known as:  ASPERCREME Apply 1 application topically as needed for muscle pain.      Follow-up Information    [name], MD. Schedule an appointment as soon as possible for a visit in 1 week(s).   Specialty:  Internal Medicine Why:  Need repeat CBC and BMP as outpatient in 1 week  Contact information: [id number] [address] [phone]          No Known Allergies  Consultations:  None   Procedures/Studies: Ct Abdomen Pelvis Wo Contrast  Result Date: 11/20/2016 CLINICAL DATA:  Diarrhea for 2 days. Fatigue. Intermittent lower abdomen pain. EXAM: CT ABDOMEN AND PELVIS WITHOUT CONTRAST TECHNIQUE: Multidetector CT imaging of the abdomen and pelvis was performed following the standard protocol without IV contrast. COMPARISON:  CT abdomen dated 11/29/2011. FINDINGS: Lower chest: No acute findings. Hepatobiliary: No focal liver abnormality is seen. No gallstones, gallbladder wall thickening, or biliary dilatation. Pancreas: Unremarkable. No pancreatic ductal dilatation or surrounding inflammatory changes. Spleen: Normal in size without focal abnormality. Adrenals/Urinary Tract: Adrenal glands are unremarkable. Multiple bilateral renal cysts. No renal stone or hydronephrosis. No perinephric fluid. No ureteral or bladder calculi identified. Bladder is unremarkable. Stomach/Bowel: Large stool ball distending the rectal vault.  Remainder of the bowel is normal in caliber. Scattered diverticulosis within the descending and sigmoid colon without evidence of acute diverticulitis. Vascular/Lymphatic: Heavy atherosclerosis of the abdominal aorta, aortic branch vessels and pelvic vasculature. No enlarged lymph nodes seen in the abdomen or pelvis. Reproductive: Status post hysterectomy.  No adnexal mass. Other: No free fluid or abscess collections seen. No free intraperitoneal air. Musculoskeletal: Degenerative changes of the scoliotic thoracolumbar spine, at least moderate in degree. No acute or suspicious osseous finding. Superficial soft tissues are unremarkable. IMPRESSION: 1. Large stool ball distending the rectal vault suggesting impaction/constipation. No CT evidence of constipation within the remainder of the normal-caliber colon. 2. Colonic diverticulosis without evidence of acute diverticulitis. 3. Aortic atherosclerosis. 4. Additional chronic/incidental findings detailed above. Electronically Signed   By: Franki Cabot M.D.   On: 11/20/2016 18:26       Discharge Exam: Vitals:   11/22/16 0532 11/22/16 1045  BP: 140/60 (!) 149/68  Pulse: 67 88  Resp: 18   Temp: 98.8 F (37.1 C)    Vitals:   11/21/16 2032 11/22/16 0532 11/22/16 0701 11/22/16 1045  BP: (!) 157/60 140/60  (!) 149/68  Pulse: 79 67  88  Resp: 18 18    Temp:  97.4 F (36.3 C) 98.8 F (37.1 C)    TempSrc: Oral Oral    SpO2: 97% 99%    Weight:   52.9 kg (116 lb 10 oz)     General: Pt is alert, awake, not in acute distress Cardiovascular: RRR, S1/S2 +, no rubs, no gallops Respiratory: CTA bilaterally, no wheezing, no rhonchi Abdominal: Soft, NT, ND, bowel sounds + Extremities: no edema, no cyanosis    The results of significant diagnostics from this hospitalization (including imaging, microbiology, ancillary and laboratory) are listed below for reference.     Microbiology: Recent Results (from the past 240 hour(s))  C difficile quick scan w  PCR reflex     Status: Abnormal   Collection Time: 11/20/16  3:32 PM  Result Value Ref Range Status   C Diff antigen POSITIVE (A) NEGATIVE Final   C Diff toxin NEGATIVE NEGATIVE Final   C Diff interpretation Results are indeterminate. See PCR results.  Final  Clostridium Difficile by PCR     Status: Abnormal   Collection Time: 11/20/16  3:32 PM  Result Value Ref Range Status   Toxigenic C Difficile by pcr POSITIVE (A) NEGATIVE Final    Comment: Positive for toxigenic C. difficile with little to no toxin production. Only treat if clinical presentation suggests symptomatic illness. CRITICAL RESULT CALLED TO, READ BACK BY AND VERIFIED WITH: Spurgeon,E RN [id number] 11/21/16 MITCHELL,L Performed at [facility], [place] [address] [place], [place] [address]      Labs: BNP (last 3 results) No results for input(s): BNP in the last 8760 hours. Basic Metabolic Panel:  Recent Labs Lab 11/20/16 1605 11/21/16 0749 11/22/16 0536  NA 134* 136 140  K 4.2 3.6 3.7  CL 94* 102 110  CO2 25 24 22   GLUCOSE 124* 86 88  BUN 117* 99* 72*  CREATININE 3.16* 2.44* 2.05*  CALCIUM 10.7* 9.6 9.4   Liver Function Tests:  Recent Labs Lab 11/20/16 1605 11/21/16 0749  AST 23 18  ALT 19 15  ALKPHOS 74 56  BILITOT 2.0* 1.9*  1.7*  PROT 7.6 6.0*  ALBUMIN 3.9 3.0*    Recent Labs Lab 11/20/16 1605  LIPASE 39   No results for input(s): AMMONIA in the last 168 hours. CBC:  Recent Labs Lab 11/20/16 1605 11/21/16 0749 11/22/16 0536  WBC 17.8* 14.0* 13.4*  HGB 12.7 9.8* 9.2*  HCT 35.7* 27.8* 26.2*  MCV 75.8* 76.0* 76.6*  PLT 196 160 148*   Cardiac Enzymes: No results for input(s): CKTOTAL, CKMB, CKMBINDEX, TROPONINI in the last 168 hours. BNP: Invalid input(s): POCBNP CBG: No results for input(s): GLUCAP in the last 168 hours. D-Dimer No results for input(s): DDIMER in the last 72 hours. Hgb A1c No results for input(s): HGBA1C in the last 72 hours. Lipid Profile No results for  input(s): CHOL, HDL, LDLCALC, TRIG, CHOLHDL, LDLDIRECT in the last 72 hours. Thyroid function studies No results for input(s): TSH, T4TOTAL, T3FREE, THYROIDAB in the last 72 hours.  Invalid input(s): FREET3 Anemia work up No results for input(s): VITAMINB12, FOLATE, FERRITIN, TIBC, IRON, RETICCTPCT in the last 72 hours. Urinalysis    Component Value Date/Time   COLORURINE YELLOW 11/20/2016 1601   APPEARANCEUR CLEAR 11/20/2016 1601   LABSPEC 1.011 11/20/2016 1601   LABSPEC 1.015 11/28/2008 1133   PHURINE 5.0 11/20/2016 1601   GLUCOSEU NEGATIVE 11/20/2016 1601   HGBUR NEGATIVE 11/20/2016 1601   BILIRUBINUR NEGATIVE 11/20/2016 1601   BILIRUBINUR Negative 11/28/2008 1133   [place] 11/20/2016  [place] 11/20/2016 1601   UROBILINOGEN 0.2 10/30/2011 1653   NITRITE NEGATIVE 11/20/2016 1601   LEUKOCYTESUR NEGATIVE 11/20/2016 1601   LEUKOCYTESUR Negative 11/28/2008 1133   Sepsis Labs Invalid input(s): PROCALCITONIN,  WBC,  LACTICIDVEN Microbiology Recent Results (from the past 240 hour(s))  C difficile quick scan w PCR reflex     Status: Abnormal   Collection Time: 11/20/16  3:32 PM  Result Value Ref Range Status   C Diff antigen POSITIVE (A) NEGATIVE Final   C Diff toxin NEGATIVE NEGATIVE Final   C Diff interpretation Results are indeterminate. See PCR results.  Final  Clostridium Difficile by PCR     Status: Abnormal   Collection Time: 11/20/16  3:32 PM  Result Value Ref Range Status   Toxigenic C Difficile by pcr POSITIVE (A) NEGATIVE Final    Comment: Positive for toxigenic C. difficile with little to no toxin production. Only treat if clinical presentation suggests symptomatic illness. CRITICAL RESULT CALLED TO, READ BACK BY AND VERIFIED WITH: Yeakel,E RN [id number] 11/21/16 MITCHELL,L Performed at [facility], [place] [address] [place], [place] [address]      Time coordinating discharge: 40 minutes  SIGNED:  Dessa Phi, DO [facility] Pager [phone]  If 7PM-7AM, please contact night-coverage [email or web address] Password [facility] 11/22/2016, 11:39 AM

## 2016-11-22 NOTE — Care Management Note (Signed)
Case Management Note  Patient Details  Name: Pam [name] MRN: [id number] Date of Birth: 05/11/1926  Subjective/Objective: Patient recc for SNF-patient declined SNF. Provided w/HHC agency list-chose [facility]/OT/aide, sw-rep Maudie Mercury aware of [name] orders,& d/c. No further CM needs.                   Action/Plan:d/c home w/HHC.   Expected Discharge Date:  11/22/16               Expected Discharge Plan:  [place]  In-House Referral:     Discharge planning Services  CM Consult  Post Acute Care Choice:  Durable Medical Equipment (Has cane, rw) Choice offered to:  Patient  DME Arranged:    DME Agency:     HH Arranged:  RN, PT, OT, Nurse's Aide, Social Work [employer] Agency:  [place]  Status of Service:  Completed, signed off  If discussed at [employer], dates discussed:    Additional Comments:  Dessa Phi, RN 11/22/2016, 12:21 PM

## 2016-11-22 NOTE — Progress Notes (Signed)
Family called back to unit asking if they had left the discharge packet. EVS who cleaned the room stated she did not see packet and was not currently in the room. Packet reprinted and left at front desk. Family stated they would come pick it up

## 2016-11-22 NOTE — Evaluation (Signed)
Physical Therapy Evaluation Patient Details Name: Pam [name] MRN: [id number] DOB: [birth year] Today's Date: 11/22/2016   History of Present Illness  83 y.o. female with a past medical history significant for CKD IV-V not on HD, HTN, Afib not on AC, and PVD who presents with diarrhea and weakness  Clinical Impression  Pt admitted with above diagnosis. Pt currently with functional limitations due to the deficits listed below (see PT Problem List).  Pt will benefit from skilled PT to increase their independence and safety with mobility to allow discharge to the venue listed below.  Pt presents with generalized weakness and limited endurance.  Pt assisted to [facility] and then over to recliner.  Pt reports niece can assist at home however uncertain family would be able to provide current level of assist.  Recommend SNF at this time.     Follow Up Recommendations SNF;Supervision/Assistance - 24 hour    Equipment Recommendations  3in1 (PT)    Recommendations for Other Services       Precautions / Restrictions Precautions Precautions: Fall      Mobility  Bed Mobility Overal bed mobility: Needs Assistance Bed Mobility: Supine to Sit     Supine to sit: Supervision;HOB elevated     General bed mobility comments: utilized bed rail  Transfers Overall transfer level: Needs assistance Equipment used: Rolling walker (2 wheeled) Transfers: Sit to/from [facility] Sit to Stand: Min assist Stand pivot transfers: Mod assist       General transfer comment: assist to rise and steady, a few instances of posterior LOB with pivoting from [facility] to recliner, pt performed some pericare however therapist also needed to assist to complete hygiene (bloody BM, RN notified)  Ambulation/Gait Ambulation/Gait assistance:  (too unsteady and weak to ambulate at this time)              Science writer    Modified Rankin (Stroke Patients Only)        Balance Overall balance assessment: Needs assistance         Standing balance support: Bilateral upper extremity supported;During functional activity Standing balance-Leahy Scale: Zero Standing balance comment: requires UE support and external assist due to posterior LOB                             Pertinent Vitals/Pain Pain Assessment: No/denies pain    Home Living Family/patient expects to be discharged to:: Private residence Living Arrangements: Alone Available Help at Discharge: Family;Available PRN/intermittently (niece checks in daily) Type of Home: House Home Access: Stairs to enter   [employer] of Steps: 4-5 Home Layout: One level Home Equipment: Walker - 4 wheels      Prior Function Level of Independence: Independent with assistive device(s)               Hand Dominance        Extremity/Trunk Assessment        Lower Extremity Assessment Lower Extremity Assessment: Generalized weakness       Communication   Communication: No difficulties  Cognition Arousal/Alertness: Awake/alert Behavior During Therapy: WFL for tasks assessed/performed Overall Cognitive Status: Within Functional Limits for tasks assessed                                        General  Comments      Exercises     Assessment/Plan    PT Assessment Patient needs continued PT services  PT Problem List Decreased strength;Decreased mobility;Decreased balance;Decreased knowledge of use of DME;Decreased knowledge of precautions;Decreased activity tolerance       PT Treatment Interventions Gait training;DME instruction;Therapeutic activities;Therapeutic exercise;Functional mobility training;Patient/family education    PT Goals (Current goals can be found in the Care Plan section)  Acute Rehab PT Goals PT Goal Formulation: With patient Time For Goal Achievement: 11/29/16 Potential to Achieve Goals: Good    Frequency Min 3X/week    Barriers to discharge        Co-evaluation               AM-PAC PT "6 Clicks" Daily Activity  Outcome Measure Difficulty turning over in bed (including adjusting bedclothes, sheets and blankets)?: A Lot Difficulty moving from lying on back to sitting on the side of the bed? : Total Difficulty sitting down on and standing up from a chair with arms (e.g., wheelchair, bedside commode, etc,.)?: Total Help needed moving to and from a bed to chair (including a wheelchair)?: A Lot Help needed walking in hospital room?: Total Help needed climbing 3-5 steps with a railing? : Total 6 Click Score: 8    End of Session Equipment Utilized During Treatment: Gait belt Activity Tolerance: Patient tolerated treatment well Patient left: in chair;with call bell/phone within reach;with chair alarm set Nurse Communication: Mobility status PT Visit Diagnosis: Other abnormalities of gait and mobility (R26.89);Muscle weakness (generalized) (M62.81)    Time: 4695-0722 PT Time Calculation (min) (ACUTE ONLY): 22 min   Charges:   PT Evaluation $PT Eval Low Complexity: 1 Procedure     PT G Codes:        Carmelia Bake, PT, DPT 11/22/2016 Pager: [phone]   York Ram E 11/22/2016, 12:03 PM

## 2016-11-22 NOTE — Progress Notes (Signed)
CSW met with patient at bedside explain role and reason for vivsiti-SNF placement for rehab. Patient pleasantly stated, "I want to H-O-M-E." She reports her niece comes by home and helps daily. She reports she uses sit down walker to get around her home. She is not interested in SNF at this time. She is agreeable to [facility].  No other social service needs identified. CSW signing off at this time.   Kathrin Greathouse, Latanya Presser, MSW Clinical Social Worker 5E and Psychiatric Service Line [phone] 11/22/2016  12:40 PM

## 2017-04-27 ENCOUNTER — Encounter (HOSPITAL_COMMUNITY): Payer: Medicare Other

## 2017-04-27 ENCOUNTER — Ambulatory Visit: Payer: Medicare Other | Admitting: Family

## 2017-09-11 ENCOUNTER — Encounter (HOSPITAL_COMMUNITY): Payer: Self-pay | Admitting: Emergency Medicine

## 2017-09-11 ENCOUNTER — Emergency Department (HOSPITAL_COMMUNITY)
Admission: EM | Admit: 2017-09-11 | Discharge: 2017-09-11 | Disposition: A | Discharge status: Home / Self Care | Payer: Medicare Other | Attending: Emergency Medicine | Admitting: Emergency Medicine

## 2017-09-11 DIAGNOSIS — I2510 Atherosclerotic heart disease of native coronary artery without angina pectoris: Secondary | ICD-10-CM | POA: Insufficient documentation

## 2017-09-11 DIAGNOSIS — E7800 Pure hypercholesterolemia, unspecified: Secondary | ICD-10-CM | POA: Diagnosis not present

## 2017-09-11 DIAGNOSIS — I10 Essential (primary) hypertension: Secondary | ICD-10-CM | POA: Diagnosis not present

## 2017-09-11 DIAGNOSIS — I4891 Unspecified atrial fibrillation: Secondary | ICD-10-CM | POA: Diagnosis not present

## 2017-09-11 DIAGNOSIS — Z87891 Personal history of nicotine dependence: Secondary | ICD-10-CM | POA: Insufficient documentation

## 2017-09-11 DIAGNOSIS — N289 Disorder of kidney and ureter, unspecified: Secondary | ICD-10-CM | POA: Diagnosis not present

## 2017-09-11 DIAGNOSIS — Z79899 Other long term (current) drug therapy: Secondary | ICD-10-CM | POA: Diagnosis not present

## 2017-09-11 DIAGNOSIS — R600 Localized edema: Secondary | ICD-10-CM | POA: Diagnosis present

## 2017-09-11 LAB — BASIC METABOLIC PANEL
Anion gap: 11 (ref 5–15)
BUN: 43 mg/dL — AB (ref 6–20)
CALCIUM: 10.4 mg/dL — AB (ref 8.9–10.3)
CO2: 23 mmol/L (ref 22–32)
CREATININE: 2.37 mg/dL — AB (ref 0.44–1.00)
Chloride: 107 mmol/L (ref 101–111)
GFR, EST AFRICAN AMERICAN: 20 mL/min — AB (ref >60)
GFR, EST NON AFRICAN AMERICAN: 17 mL/min — AB (ref >60)
Glucose, Bld: 80 mg/dL (ref 65–99)
Potassium: 4.8 mmol/L (ref 3.5–5.1)
SODIUM: 141 mmol/L (ref 135–145)

## 2017-09-11 MED ORDER — ENOXAPARIN SODIUM 60 MG/0.6ML ~~LOC~~ SOLN
1.0000 mg/kg | Freq: Once | SUBCUTANEOUS | Status: AC
  Administered 2017-09-11: 22:00:00 50 mg via SUBCUTANEOUS
  Filled 2017-09-11: qty 0.5

## 2017-09-11 NOTE — ED Notes (Signed)
Provider verbalized measuring calf size  14 inches on right  13 inches on left  Reported back to provider

## 2017-09-11 NOTE — ED Provider Notes (Signed)
[facility] DEPT Provider Note   CSN: [id number] Arrival date & time: 09/11/17  1548     History   Chief Complaint Chief Complaint  Patient presents with  . bilat lower extremity swelling    HPI KEYRY IRACHETA is a 84 y.o. female.  84 year old female presents with 1 day history of bilateral lower extremity right greater than left.  Denies any chest pain or chest pressure.  She is not been short of breath.  States she feels at her baseline right now.  Does have a history of renal insufficiency.  Does take diuretics daily.  No reported fever.  Symptoms persistent and nothing makes it better or worse no treatment used prior to arrival     Past Medical History:  Diagnosis Date  . Anemia   . Ankle edema   . Atrial fibrillation ([place])   . CAD (coronary artery disease)   . Carotid bruit    bilateral  . Chronic kidney disease   . Diverticulosis   . GERD (gastroesophageal reflux disease)   . History of blood transfusion    "probably related to anemia"  . History of kidney stones   . Hypercholesterolemia   . Hypertension   . Hypoparathyroidism ([place])   . Osteoporosis   . PAD (peripheral artery disease) ([place])   . Personal history of colonic polyps 05/01/2001   hyperplastic  . Spinal stenosis   . Vitamin D deficiency     Patient Active Problem List   Diagnosis Date Noted  . Constipation 11/21/2016  . Uremia 11/20/2016  . Impacted stool in rectum ([place]) 11/20/2016  . CKD (chronic kidney disease), stage IV ([place]) 11/20/2016  . PAD (peripheral artery disease) ([place]) 07/01/2016  . Abdominal aortic aneurysm without rupture ([place]) 04/18/2014  . PVD (peripheral vascular disease) ([place]) 03/20/2014  . Ankle edema 01/15/2013  . ARTHRITIS, KNEE 05/16/2009  . VULVAR ABSCESS 05/02/2009  . Normocytic anemia 08/09/2008  . FECAL OCCULT BLOOD 08/09/2008  . VITAMIN D DEFICIENCY 08/08/2008  . HYPERLIPIDEMIA 08/08/2008  . Essential hypertension 08/08/2008  .  FIBRILLATION, ATRIAL 08/08/2008  . CAROTID ARTERY DISEASE 08/08/2008  . GERD 08/08/2008  . HIATAL HERNIA 08/08/2008  . DIVERTICULOSIS, COLON 08/08/2008  . GASTROINTESTINAL HEMORRHAGE 08/08/2008  . RENAL DISEASE, CHRONIC 08/08/2008  . SPINAL STENOSIS 08/08/2008  . OSTEOPOROSIS 08/08/2008  . HYPERPARATHYROIDISM, HX OF 08/08/2008  . COLONIC POLYPS, HYPERPLASTIC, HX OF 08/08/2008  . RENAL CALCULUS, HX OF 08/08/2008    Past Surgical History:  Procedure Laterality Date  . ABDOMINAL AORTAGRAM N/A 05/03/2014   Procedure: ABDOMINAL Maxcine Ham;  Surgeon: Elam Dutch, MD;  Location: [facility] C[facility] LAB;  Service: Cardiovascular;  Laterality: N/A;  . ABDOMINAL HYSTERECTOMY    . APPENDECTOMY    . CARDIOVERSION  01/04/2003  . carotid duplex  01/10/2012   less than 50% bilat disease  . CATARACT EXTRACTION, BILATERAL Bilateral   . COLONOSCOPY    . KIDNEY STONE SURGERY Bilateral    Removal of bilateral kidney stones; "they cut me open"  . PERIPHERAL VASCULAR CATHETERIZATION N/A 07/02/2016   Procedure: Abdominal Aortogram;  Surgeon: Elam Dutch, MD;  Location: [facility] CV LAB;  Service: Cardiovascular;  Laterality: N/A;  . PERIPHERAL VASCULAR CATHETERIZATION Bilateral 07/02/2016   Procedure: Lower Extremity Angiography;  Surgeon: Elam Dutch, MD;  Location: [place] CV LAB;  Service: Cardiovascular;  Laterality: Bilateral;  LIMITED TO ILIACS  . PERIPHERAL VASCULAR CATHETERIZATION Right 07/02/2016   Procedure: Peripheral Vascular Intervention;  Surgeon: Elam Dutch,  MD;  Location: [facility] CV LAB;  Service: Cardiovascular;  Laterality: Right;  COMMON AND EXTERNAL ILIACS  STENTS X 3  . POLYPECTOMY    . TRANSTHORACIC ECHOCARDIOGRAM  03/2004   EF normal; RA mod-severely dilated, LA mod dilated; mild MR, mild TR; aortic valve mildly sclerotic; mild pulm valve regurg     OB History   None      Home Medications    Prior to Admission medications   Medication Sig Start Date End Date  Taking? Authorizing Provider  B Complex-C-Iron (B COMPLEX-IRON PO) Take 2 capsules by mouth daily.    [provider]  hydrochlorothiazide (MICROZIDE) 12.5 MG capsule Take 1 capsule (12.5 mg total) by mouth daily. 07/05/16   Alvia Grove, PA-C  lubiprostone (AMITIZA) 24 MCG capsule Take 24 mcg by mouth 2 (two) times daily with a meal.    [provider]  metoprolol tartrate (LOPRESSOR) 25 MG tablet Take 25 mg by mouth 2 (two) times daily.  06/18/16   [provider]  polyethylene glycol (MIRALAX / GLYCOLAX) packet Take 17 g by mouth daily. 11/22/16   Dessa Phi, DO  torsemide (DEMADEX) 10 MG tablet Take 1 tablet (10 mg total) by mouth daily. 07/05/16   Alvia Grove, PA-C  trolamine salicylate (ASPERCREME) 10 % cream Apply 1 application topically as needed for muscle pain.    [provider]    Family History Family History  Problem Relation Age of Onset  . Heart disease Mother   . Heart disease Father   . Colon cancer Neg Hx     Social History Social History   Tobacco Use  . Smoking status: Former Smoker    Packs/day: 0.12    Years: 65.00    Pack years: 7.80    Types: Cigarettes    Last attempt to quit: 08/01/2014    Years since quitting: 3.1  . Smokeless tobacco: Never Used  . Tobacco comment: "started smoking early in my ?27s"  Substance Use Topics  . Alcohol use: Yes    Alcohol/week: 2.4 oz    Types: 4 Shots of liquor per week    Comment: 07/01/2016 "I drink boubon on the weekend"  . Drug use: No     Allergies   Patient has no known allergies.   Review of Systems Review of Systems  All other systems reviewed and are negative.    Physical Exam Updated Vital Signs BP (!) 166/87 (BP Location: Right Arm)   Pulse 65   Temp 97.7 F (36.5 C) (Oral)   Resp 18   Wt 52.2 kg (115 lb)   SpO2 99%   BMI 21.03 kg/m   Physical Exam  Constitutional: She is oriented to person, place, and time. She appears well-developed and  well-nourished.  Non-toxic appearance. No distress.  HENT:  Head: Normocephalic and atraumatic.  Eyes: Pupils are equal, round, and reactive to light. Conjunctivae, EOM and lids are normal.  Neck: Normal range of motion. Neck supple. No tracheal deviation present. No thyroid mass present.  Cardiovascular: Normal rate, regular rhythm and normal heart sounds. Exam reveals no gallop.  No murmur heard. Pulmonary/Chest: Effort normal and breath sounds normal. No stridor. No respiratory distress. She has no decreased breath sounds. She has no wheezes. She has no rhonchi. She has no rales.  Abdominal: Soft. Normal appearance and bowel sounds are normal. She exhibits no distension. There is no tenderness. There is no rebound and no CVA tenderness.  Musculoskeletal: Normal range of  motion. She exhibits no edema or tenderness.  Lymphadenopathy:  3+ bilateral lower extremity pitting edema.  Worse on the right side than the left.  Neurological: She is alert and oriented to person, place, and time. She has normal strength. No cranial nerve deficit or sensory deficit. GCS eye subscore is 4. GCS verbal subscore is 5. GCS motor subscore is 6.  Skin: Skin is warm and dry. No abrasion and no rash noted.  Psychiatric: She has a normal mood and affect. Her speech is normal and behavior is normal.  Nursing note and vitals reviewed.    ED Treatments / Results  Labs (all labs ordered are listed, but only abnormal results are displayed) Labs Reviewed  BASIC METABOLIC PANEL    EKG None  Radiology No results found.  Procedures Procedures (including critical care time)  Medications Ordered in ED Medications - No data to display   Initial Impression / Assessment and Plan / ED Course  I have reviewed the triage vital signs and the nursing notes.  Pertinent labs & imaging results that were available during my care of the patient were reviewed by me and considered in my medical decision making (see chart  for details).     Patient with worsening renal insufficiency.  She also has asymmetric swelling of her lower extremities right greater than left.  Concern for possible DVT.  Patient to be given dose of Lovenox here and return in the morning for outpatient DVT study.  Patient instructed to follow-up with her doctor for her kidney function as well as given strict return precautions  Final Clinical Impressions(s) / ED Diagnoses   Final diagnoses:  None    ED Discharge Orders    None       Lacretia Leigh, MD 09/11/17 2124

## 2017-09-11 NOTE — Discharge Instructions (Addendum)
Return here once for shortness of breath, chest pain, or any other problems.  Your kidney function tonight is decreased and he will need to follow-up with your doctor for this

## 2017-09-11 NOTE — ED Triage Notes (Signed)
Pt brought in by family for bilat lower leg swelling that believes started today. Denies any pain. Pt's family member states that she has circulatory problems.

## 2017-09-12 ENCOUNTER — Ambulatory Visit (HOSPITAL_COMMUNITY)
Admission: RE | Admit: 2017-09-12 | Discharge: 2017-09-12 | Disposition: A | Discharge status: Home / Self Care | Payer: Medicare Other | Source: Ambulatory Visit | Attending: Emergency Medicine | Admitting: Emergency Medicine

## 2017-09-12 DIAGNOSIS — M7989 Other specified soft tissue disorders: Secondary | ICD-10-CM | POA: Diagnosis not present

## 2017-09-12 NOTE — Progress Notes (Signed)
VASCULAR LAB PRELIMINARY  PRELIMINARY  PRELIMINARY  PRELIMINARY  Right lower extremity venous duplex completed.    Preliminary report:  There is no DVT or SVT noted in the right lower extremity.   Martisha Toulouse, RVT 09/12/2017, 9:17 AM

## 2017-10-04 ENCOUNTER — Ambulatory Visit: Payer: Medicare Other | Admitting: Cardiovascular Disease

## 2017-10-04 ENCOUNTER — Encounter: Payer: Self-pay | Admitting: Cardiovascular Disease

## 2017-10-04 DIAGNOSIS — I739 Peripheral vascular disease, unspecified: Secondary | ICD-10-CM

## 2017-10-04 DIAGNOSIS — I482 Chronic atrial fibrillation, unspecified: Secondary | ICD-10-CM

## 2017-10-04 DIAGNOSIS — I10 Essential (primary) hypertension: Secondary | ICD-10-CM | POA: Diagnosis not present

## 2017-10-04 DIAGNOSIS — R600 Localized edema: Secondary | ICD-10-CM

## 2017-10-04 NOTE — Progress Notes (Signed)
10/04/2017 Pam [name]   [birth year]  [id number]  Primary Physician [name], MD Primary Cardiologist: [name] MD Lupe Carney, [place]  HPI:  Pam [name] is a 84 y.o.  thin appearing married African American female patient of Dr. [name] in the past. I last saw her in the office 08/21/2014.She has a history of chronic A. Fib rate controlled. She was on oral and to coagulation in the past which was discontinued by Dr. [name] because of melena. She has treated hypertension, hyperlipidemia as well as was moderate carotid disease. She denies chest pain or shortness of breath.  She underwent right iliac stenting by Dr. [name] of 07/02/2016.  She was recently seen in the emergency room by Dr. Zenia Resides 09/11/2017.  Her complaints were of bilateral ankle swelling right greater than left.  She is currently is on torsemide and hydrochlorothiazide with improvement in her swelling over the last several weeks.  Current Meds  Medication Sig  . calcitRIOL (ROCALTROL) 0.25 MCG capsule Take 0.25 mcg by mouth daily.  . hydrochlorothiazide (MICROZIDE) 12.5 MG capsule Take 1 capsule (12.5 mg total) by mouth daily.  . IRON PO Take 1 tablet by mouth daily.  . metoprolol tartrate (LOPRESSOR) 25 MG tablet Take 25 mg by mouth 2 (two) times daily.   . polyethylene glycol (MIRALAX / GLYCOLAX) packet Take 17 g by mouth daily.  Marland Kitchen torsemide (DEMADEX) 10 MG tablet Take 1 tablet (10 mg total) by mouth daily.     No Known Allergies  Social History   Socioeconomic History  . Marital status: Widowed    Spouse name: Not on file  . Number of children: 0  . Years of education: Not on file  . Highest education level: Not on file  Occupational History    Employer: RETIRED  Social Needs  . Financial resource strain: Not on file  . Food insecurity:    Worry: Not on file    Inability: Not on file  . Transportation needs:    Medical: Not on file    Non-medical: Not on file  Tobacco Use  . Smoking status:  Former Smoker    Packs/day: 0.12    Years: 65.00    Pack years: 7.80    Types: Cigarettes    Last attempt to quit: 08/01/2014    Years since quitting: 3.1  . Smokeless tobacco: Never Used  . Tobacco comment: "started smoking early in my ?6s"  Substance and Sexual Activity  . Alcohol use: Yes    Alcohol/week: 2.4 oz    Types: 4 Shots of liquor per week    Comment: 07/01/2016 "I drink boubon on the weekend"  . Drug use: No  . Sexual activity: Never  Lifestyle  . Physical activity:    Days per week: Not on file    Minutes per session: Not on file  . Stress: Not on file  Relationships  . Social connections:    Talks on phone: Not on file    Gets together: Not on file    Attends religious service: Not on file    Active member of club or organization: Not on file    Attends meetings of clubs or organizations: Not on file    Relationship status: Not on file  . Intimate partner violence:    Fear of current or ex partner: Not on file    Emotionally abused: Not on file    Physically abused: Not on file    Forced  sexual activity: Not on file  Other Topics Concern  . Not on file  Social History Narrative  . Not on file     Review of Systems: General: negative for chills, fever, night sweats or weight changes.  Cardiovascular: negative for chest pain, dyspnea on exertion, edema, orthopnea, palpitations, paroxysmal nocturnal dyspnea or shortness of breath Dermatological: negative for rash Respiratory: negative for cough or wheezing Urologic: negative for hematuria Abdominal: negative for nausea, vomiting, diarrhea, bright red blood per rectum, melena, or hematemesis Neurologic: negative for visual changes, syncope, or dizziness All other systems reviewed and are otherwise negative except as noted above.    Blood pressure (!) 155/75, pulse 80, weight 112 lb (50.8 kg).  General appearance: alert and no distress Neck: no adenopathy, no carotid bruit, no JVD, supple, symmetrical,  trachea midline and thyroid not enlarged, symmetric, no tenderness/mass/nodules Lungs: clear to auscultation bilaterally Heart: irregularly irregular rhythm Extremities: Bilateral ankle swelling right greater than left Pulses: 2+ and symmetric Skin: Skin color, texture, turgor normal. No rashes or lesions Neurologic: Alert and oriented X 3, normal strength and tone. Normal symmetric reflexes. Normal coordination and gait  EKG not performed today  ASSESSMENT AND PLAN:   HYPERLIPIDEMIA History of hyperlipidemia not on statin therapy  Essential hypertension History of essential hypertension her blood pressure measured at 155/75 she is on hydrochlorothiazide and metoprolol.  Continue current meds at current dosing.  FIBRILLATION, ATRIAL Chronic atrial fibrillation rate controlled not on oral active coagulation because of melena in the past.  PAD (peripheral artery disease) (HCC) History of peripheral arterial disease status post abdominal aortogram and right iliac stenting by Dr. [name] 07/02/2016.  Bilateral lower extremity edema Bilateral ankle edema right greater than left on torsemide and hydrochlorothiazide improved over the last several weeks.      [name] MD FACP,FACC,FAHA, [facility] 10/04/2017 10:11 AM

## 2017-10-04 NOTE — Patient Instructions (Signed)

## 2017-10-04 NOTE — Assessment & Plan Note (Signed)
History of essential hypertension her blood pressure measured at 155/75 she is on hydrochlorothiazide and metoprolol.  Continue current meds at current dosing.

## 2017-10-04 NOTE — Assessment & Plan Note (Signed)
Bilateral ankle edema right greater than left on torsemide and hydrochlorothiazide improved over the last several weeks.

## 2017-10-04 NOTE — Assessment & Plan Note (Signed)
Chronic atrial fibrillation rate controlled not on oral active coagulation because of melena in the past.

## 2017-10-04 NOTE — Assessment & Plan Note (Signed)
History of peripheral arterial disease status post abdominal aortogram and right iliac stenting by Dr. [name] 07/02/2016.

## 2017-10-04 NOTE — Assessment & Plan Note (Signed)
History of hyperlipidemia not on statin therapy. 

## 2018-09-26 ENCOUNTER — Telehealth: Payer: Self-pay | Admitting: *Deleted

## 2018-09-26 NOTE — Telephone Encounter (Signed)
09/26/18 LMOM @ 1:49 pm,re:follow up appointment.

## 2018-10-11 NOTE — Telephone Encounter (Signed)
Left message for patient to call and schedule virtual/telephone 1 year visit with Dr. Gwenlyn Found (per recall)

## 2019-02-13 ENCOUNTER — Telehealth: Payer: Self-pay | Admitting: Cardiovascular Disease

## 2019-02-13 NOTE — Telephone Encounter (Signed)
LVM for patient to call and schedule 1 yr followup with Dr. Berry. 

## 2019-03-26 ENCOUNTER — Emergency Department (HOSPITAL_COMMUNITY): Payer: Medicare Other

## 2019-03-26 ENCOUNTER — Encounter (HOSPITAL_COMMUNITY): Payer: Self-pay | Admitting: Emergency Medicine

## 2019-03-26 ENCOUNTER — Other Ambulatory Visit: Payer: Self-pay

## 2019-03-26 ENCOUNTER — Inpatient Hospital Stay (HOSPITAL_COMMUNITY)
Admission: EM | Admit: 2019-03-26 | Discharge: 2019-03-29 | DRG: 689 | Disposition: A | Discharge status: Home Health | Payer: Medicare Other | Attending: Internal Medicine | Admitting: Internal Medicine

## 2019-03-26 DIAGNOSIS — N390 Urinary tract infection, site not specified: Secondary | ICD-10-CM | POA: Diagnosis present

## 2019-03-26 DIAGNOSIS — M4800 Spinal stenosis, site unspecified: Secondary | ICD-10-CM | POA: Diagnosis present

## 2019-03-26 DIAGNOSIS — J189 Pneumonia, unspecified organism: Secondary | ICD-10-CM | POA: Diagnosis present

## 2019-03-26 DIAGNOSIS — E7800 Pure hypercholesterolemia, unspecified: Secondary | ICD-10-CM | POA: Diagnosis present

## 2019-03-26 DIAGNOSIS — E860 Dehydration: Secondary | ICD-10-CM | POA: Diagnosis present

## 2019-03-26 DIAGNOSIS — Z66 Do not resuscitate: Secondary | ICD-10-CM | POA: Diagnosis present

## 2019-03-26 DIAGNOSIS — K219 Gastro-esophageal reflux disease without esophagitis: Secondary | ICD-10-CM | POA: Diagnosis present

## 2019-03-26 DIAGNOSIS — Z87891 Personal history of nicotine dependence: Secondary | ICD-10-CM | POA: Diagnosis not present

## 2019-03-26 DIAGNOSIS — Z20828 Contact with and (suspected) exposure to other viral communicable diseases: Secondary | ICD-10-CM | POA: Diagnosis present

## 2019-03-26 DIAGNOSIS — N184 Chronic kidney disease, stage 4 (severe): Secondary | ICD-10-CM | POA: Diagnosis present

## 2019-03-26 DIAGNOSIS — I1311 Hypertensive heart and chronic kidney disease without heart failure, with stage 5 chronic kidney disease, or end stage renal disease: Secondary | ICD-10-CM | POA: Diagnosis present

## 2019-03-26 DIAGNOSIS — M810 Age-related osteoporosis without current pathological fracture: Secondary | ICD-10-CM | POA: Diagnosis present

## 2019-03-26 DIAGNOSIS — K5790 Diverticulosis of intestine, part unspecified, without perforation or abscess without bleeding: Secondary | ICD-10-CM | POA: Diagnosis present

## 2019-03-26 DIAGNOSIS — Z87442 Personal history of urinary calculi: Secondary | ICD-10-CM | POA: Diagnosis not present

## 2019-03-26 DIAGNOSIS — I482 Chronic atrial fibrillation, unspecified: Secondary | ICD-10-CM | POA: Diagnosis present

## 2019-03-26 DIAGNOSIS — Z8249 Family history of ischemic heart disease and other diseases of the circulatory system: Secondary | ICD-10-CM

## 2019-03-26 DIAGNOSIS — I739 Peripheral vascular disease, unspecified: Secondary | ICD-10-CM | POA: Diagnosis present

## 2019-03-26 DIAGNOSIS — N179 Acute kidney failure, unspecified: Secondary | ICD-10-CM | POA: Diagnosis present

## 2019-03-26 DIAGNOSIS — I2510 Atherosclerotic heart disease of native coronary artery without angina pectoris: Secondary | ICD-10-CM | POA: Diagnosis present

## 2019-03-26 DIAGNOSIS — N3000 Acute cystitis without hematuria: Secondary | ICD-10-CM | POA: Diagnosis present

## 2019-03-26 DIAGNOSIS — Z79899 Other long term (current) drug therapy: Secondary | ICD-10-CM

## 2019-03-26 DIAGNOSIS — R531 Weakness: Secondary | ICD-10-CM | POA: Diagnosis not present

## 2019-03-26 DIAGNOSIS — E209 Hypoparathyroidism, unspecified: Secondary | ICD-10-CM | POA: Diagnosis present

## 2019-03-26 DIAGNOSIS — E559 Vitamin D deficiency, unspecified: Secondary | ICD-10-CM | POA: Diagnosis present

## 2019-03-26 LAB — URINALYSIS, ROUTINE W REFLEX MICROSCOPIC
Bilirubin Urine: NEGATIVE
Glucose, UA: NEGATIVE mg/dL
Ketones, ur: NEGATIVE mg/dL
Nitrite: NEGATIVE
Protein, ur: NEGATIVE mg/dL
Specific Gravity, Urine: 1.006 (ref 1.005–1.030)
WBC, UA: >50 WBC/hpf — ABNORMAL HIGH (ref 0–5)
pH: 6 (ref 5.0–8.0)

## 2019-03-26 LAB — COMPREHENSIVE METABOLIC PANEL
ALT: 9 U/L (ref 0–44)
AST: 17 U/L (ref 15–41)
Albumin: 3.8 g/dL (ref 3.5–5.0)
Alkaline Phosphatase: 39 U/L (ref 38–126)
Anion gap: 12 (ref 5–15)
BUN: 87 mg/dL — ABNORMAL HIGH (ref 8–23)
CO2: 25 mmol/L (ref 22–32)
Calcium: 11.7 mg/dL — ABNORMAL HIGH (ref 8.9–10.3)
Chloride: 101 mmol/L (ref 98–111)
Creatinine, Ser: 3.56 mg/dL — ABNORMAL HIGH (ref 0.44–1.00)
GFR calc Af Amer: 12 mL/min — ABNORMAL LOW (ref >60)
GFR calc non Af Amer: 11 mL/min — ABNORMAL LOW (ref >60)
Glucose, Bld: 98 mg/dL (ref 70–99)
Potassium: 4.6 mmol/L (ref 3.5–5.1)
Sodium: 138 mmol/L (ref 135–145)
Total Bilirubin: 1 mg/dL (ref 0.3–1.2)
Total Protein: 7.6 g/dL (ref 6.5–8.1)

## 2019-03-26 LAB — CBC
HCT: 31.5 % — ABNORMAL LOW (ref 36.0–46.0)
Hemoglobin: 10.3 g/dL — ABNORMAL LOW (ref 12.0–15.0)
MCH: 27.4 pg (ref 26.0–34.0)
MCHC: 32.7 g/dL (ref 30.0–36.0)
MCV: 83.8 fL (ref 80.0–100.0)
Platelets: 210 10*3/uL (ref 150–400)
RBC: 3.76 MIL/uL — ABNORMAL LOW (ref 3.87–5.11)
RDW: 13.1 % (ref 11.5–15.5)
WBC: 6.6 10*3/uL (ref 4.0–10.5)
nRBC: 0 % (ref 0.0–0.2)

## 2019-03-26 LAB — SARS CORONAVIRUS 2 (TAT 6-24 HRS): SARS Coronavirus 2: NEGATIVE

## 2019-03-26 LAB — TSH: TSH: 3.458 u[IU]/mL (ref 0.350–4.500)

## 2019-03-26 MED ORDER — HEPARIN SODIUM (PORCINE) 5000 UNIT/ML IJ SOLN
5000.0000 [IU] | Freq: Three times a day (TID) | INTRAMUSCULAR | Status: DC
  Administered 2019-03-26 – 2019-03-29 (×8): 5000 [IU] via SUBCUTANEOUS
  Filled 2019-03-26 (×9): qty 1

## 2019-03-26 MED ORDER — CALCITRIOL 0.25 MCG PO CAPS
0.2500 ug | ORAL_CAPSULE | Freq: Every day | ORAL | Status: DC
  Administered 2019-03-27: 0.25 ug via ORAL
  Filled 2019-03-26 (×3): qty 1

## 2019-03-26 MED ORDER — SODIUM CHLORIDE 0.9 % IV SOLN
1.0000 g | Freq: Once | INTRAVENOUS | Status: AC
  Administered 2019-03-26: 1 g via INTRAVENOUS
  Filled 2019-03-26: qty 10

## 2019-03-26 MED ORDER — SODIUM CHLORIDE 0.9 % IV BOLUS
1000.0000 mL | Freq: Once | INTRAVENOUS | Status: AC
  Administered 2019-03-26: 1000 mL via INTRAVENOUS

## 2019-03-26 MED ORDER — METOPROLOL TARTRATE 25 MG PO TABS
25.0000 mg | ORAL_TABLET | Freq: Two times a day (BID) | ORAL | Status: DC
  Administered 2019-03-26 – 2019-03-29 (×5): 25 mg via ORAL
  Filled 2019-03-26 (×6): qty 1

## 2019-03-26 MED ORDER — SODIUM CHLORIDE 0.9% FLUSH
3.0000 mL | Freq: Once | INTRAVENOUS | Status: DC
Start: 2019-03-26 — End: 2019-03-28

## 2019-03-26 MED ORDER — SODIUM CHLORIDE 0.9 % IV SOLN
INTRAVENOUS | Status: DC
  Administered 2019-03-26 – 2019-03-29 (×5): via INTRAVENOUS

## 2019-03-26 MED ORDER — SODIUM CHLORIDE 0.9 % IV SOLN
1.0000 g | INTRAVENOUS | Status: DC
  Administered 2019-03-27 – 2019-03-28 (×2): 1 g via INTRAVENOUS
  Filled 2019-03-26: qty 10
  Filled 2019-03-26 (×2): qty 1

## 2019-03-26 MED ORDER — SODIUM CHLORIDE 0.9 % IV SOLN
500.0000 mg | INTRAVENOUS | Status: DC
  Administered 2019-03-26 – 2019-03-27 (×2): 500 mg via INTRAVENOUS
  Filled 2019-03-26 (×4): qty 500

## 2019-03-26 MED ORDER — FERROUS GLUCONATE 324 (38 FE) MG PO TABS
ORAL_TABLET | Freq: Every day | ORAL | Status: DC
  Administered 2019-03-27 – 2019-03-29 (×3): 324 mg via ORAL
  Filled 2019-03-26 (×3): qty 1

## 2019-03-26 NOTE — ED Notes (Signed)
CBG 102 at 1235

## 2019-03-26 NOTE — ED Provider Notes (Signed)
[facility] Emergency Department Provider Note MRN:  [id number]  Arrival date & time: 03/26/19     Chief Complaint   Fatigue   History of Present Illness   Pam [name] is a 85 y.o. year-old female with a history of PAD, GERD, CAD, A. fib presenting to the ED with chief complaint of fatigue.  1 week of fatigue and generalized weakness.  Some decreased p.o. intake.  Denies headache, no vision change, no confusion, no vomiting, no fever, no cough, no chest pain, no shortness of breath, no abdominal pain, no diarrhea, no blood in stool, no black stools.  Symptoms constant, moderate in severity, no exacerbating or alleviating factors.  Some unintentional weight loss over the past few months.  Review of Systems  A complete 10 system review of systems was obtained and all systems are negative except as noted in the HPI and PMH.   Patient's Health History    Past Medical History:  Diagnosis Date  . Anemia   . Ankle edema   . Atrial fibrillation ([place])   . CAD (coronary artery disease)   . Carotid bruit    bilateral  . Chronic kidney disease   . Diverticulosis   . GERD (gastroesophageal reflux disease)   . History of blood transfusion    "probably related to anemia"  . History of kidney stones   . Hypercholesterolemia   . Hypertension   . Hypoparathyroidism ([place])   . Osteoporosis   . PAD (peripheral artery disease) ([place])   . Personal history of colonic polyps 05/01/2001   hyperplastic  . Spinal stenosis   . Vitamin D deficiency     Past Surgical History:  Procedure Laterality Date  . ABDOMINAL AORTAGRAM N/A 05/03/2014   Procedure: ABDOMINAL Maxcine Ham;  Surgeon: Elam Dutch, MD;  Location: [facility] CATH LAB;  Service: Cardiovascular;  Laterality: N/A;  . ABDOMINAL HYSTERECTOMY    . APPENDECTOMY    . CARDIOVERSION  01/04/2003  . carotid duplex  01/10/2012   less than 50% bilat disease  . CATARACT EXTRACTION, BILATERAL Bilateral   . COLONOSCOPY    .  KIDNEY STONE SURGERY Bilateral    Removal of bilateral kidney stones; "they cut me open"  . PERIPHERAL VASCULAR CATHETERIZATION N/A 07/02/2016   Procedure: Abdominal Aortogram;  Surgeon: Elam Dutch, MD;  Location: [place] CV LAB;  Service: Cardiovascular;  Laterality: N/A;  . PERIPHERAL VASCULAR CATHETERIZATION Bilateral 07/02/2016   Procedure: Lower Extremity Angiography;  Surgeon: Elam Dutch, MD;  Location: [place] CV LAB;  Service: Cardiovascular;  Laterality: Bilateral;  LIMITED TO ILIACS  . PERIPHERAL VASCULAR CATHETERIZATION Right 07/02/2016   Procedure: Peripheral Vascular Intervention;  Surgeon: Elam Dutch, MD;  Location: [place] CV LAB;  Service: Cardiovascular;  Laterality: Right;  COMMON AND EXTERNAL ILIACS  STENTS X 3  . POLYPECTOMY    . TRANSTHORACIC ECHOCARDIOGRAM  03/2004   EF normal; RA mod-severely dilated, LA mod dilated; mild MR, mild TR; aortic valve mildly sclerotic; mild pulm valve regurg    Family History  Problem Relation Age of Onset  . Heart disease Mother   . Heart disease Father   . Colon cancer Neg Hx     Social History   Socioeconomic History  . Marital status: Widowed    Spouse name: Not on file  . Number of children: 0  . Years of education: Not on file  . Highest education level: Not on file  Occupational History    Employer:  RETIRED  Social Needs  . Financial resource strain: Not on file  . Food insecurity    Worry: Not on file    Inability: Not on file  . Transportation needs    Medical: Not on file    Non-medical: Not on file  Tobacco Use  . Smoking status: Former Smoker    Packs/day: 0.12    Years: 65.00    Pack years: 7.80    Types: Cigarettes    Quit date: 08/01/2014    Years since quitting: 4.6  . Smokeless tobacco: Never Used  . Tobacco comment: "started smoking early in my ?46s"  Substance and Sexual Activity  . Alcohol use: Yes    Alcohol/week: 4.0 standard drinks    Types: 4 Shots of liquor per week     Comment: 07/01/2016 "I drink boubon on the weekend"  . Drug use: No  . Sexual activity: Never  Lifestyle  . Physical activity    Days per week: Not on file    Minutes per session: Not on file  . Stress: Not on file  Relationships  . Social Herbalist on phone: Not on file    Gets together: Not on file    Attends religious service: Not on file    Active member of club or organization: Not on file    Attends meetings of clubs or organizations: Not on file    Relationship status: Not on file  . Intimate partner violence    Fear of current or ex partner: Not on file    Emotionally abused: Not on file    Physically abused: Not on file    Forced sexual activity: Not on file  Other Topics Concern  . Not on file  Social History Narrative  . Not on file     Physical Exam  Vital Signs and Nursing Notes reviewed Vitals:   03/26/19 1400 03/26/19 1500  BP: (!) 149/71 (!) 155/81  Pulse: 62 64  Resp: 18 17  Temp:    SpO2: 95% 96%    CONSTITUTIONAL: Well-appearing, NAD NEURO:  Alert and oriented x 3, no focal deficits EYES:  eyes equal and reactive ENT/NECK:  no LAD, no JVD CARDIO: Regular rate, well-perfused, normal S1 and S2 PULM:  CTAB no wheezing or rhonchi GI/GU:  normal bowel sounds, non-distended, non-tender MSK/SPINE:  No gross deformities, no edema SKIN:  no rash, atraumatic PSYCH:  Appropriate speech and behavior  Diagnostic and Interventional Summary    EKG Interpretation  Date/Time:  Monday March 26 2019 12:12:16 EDT Ventricular Rate:  72 PR Interval:    QRS Duration: 79 QT Interval:  405 QTC Calculation: 381 R Axis:   -11 Text Interpretation: Atrial fibrillation LVH with secondary repolarization abnormality Anterior Q waves, possibly due to LVH Baseline wander in lead(s) V5 Confirmed by Gerlene Fee [phone]) on 03/26/2019 12:31:00 PM      Labs Reviewed  CBC - Abnormal; Notable for the following components:      Result Value   RBC 3.76 (*)     Hemoglobin 10.3 (*)    HCT 31.5 (*)    All other components within normal limits  URINALYSIS, ROUTINE W REFLEX MICROSCOPIC - Abnormal; Notable for the following components:   Color, Urine STRAW (*)    Hgb urine dipstick SMALL (*)    Leukocytes,Ua MODERATE (*)    WBC, UA >50 (*)    Bacteria, UA MANY (*)    All other components within normal limits  COMPREHENSIVE METABOLIC PANEL - Abnormal; Notable for the following components:   BUN 87 (*)    Creatinine, Ser 3.56 (*)    Calcium 11.7 (*)    GFR calc non Af Amer 11 (*)    GFR calc Af Amer 12 (*)    All other components within normal limits  SARS CORONAVIRUS 2 (TAT 6-24 HRS)  URINE CULTURE  TSH  CBG MONITORING, ED    DG Chest Port 1 View  Final Result      Medications  sodium chloride flush (NS) 0.9 % injection 3 mL (0 mLs Intravenous Hold 03/26/19 1236)  cefTRIAXone (ROCEPHIN) 1 g in sodium chloride 0.9 % 100 mL IVPB (1 g Intravenous New Bag/Given 03/26/19 1455)  sodium chloride 0.9 % bolus 1,000 mL (1,000 mLs Intravenous Bolus from Bag 03/26/19 1454)     Procedures  /  Critical Care  ED Course and Medical Decision Making  I have reviewed the triage vital signs and the nursing notes.  Pertinent labs & imaging results that were available during my care of the patient were reviewed by me and considered in my medical decision making (see below for details).  Fatigue and malaise, considering metabolic disarray, anemia, dehydration, occult pneumonia or UTI, also consider underlying malignancy given patient's age and unintentional weight loss..  Work-up is pending.  Work-up reveals urinary tract infection as well as AKI, admitted to hospital service for further care.  Provided with IV fluids as well as IV ceftriaxone.  Barth Kirks. [place]onia Small, [place] [email or web address]  Final Clinical Impressions(s) / ED Diagnoses     ICD-10-CM   1. Acute cystitis without hematuria  N30.00    2. Weakness  R53.1 DG Chest [facility] 1 View    DG Chest Port 1 View  3. AKI (acute kidney injury) ([place])  N17.9     ED Discharge Orders    None      Discharge Instructions Discussed with and Provided to Patient: Discharge Instructions   None       Maudie Flakes, MD 03/26/19 [phone]

## 2019-03-26 NOTE — ED Notes (Signed)
ED TO INPATIENT HANDOFF REPORT  ED Nurse Name and Phone #:   S Name/Age/Gender Pam [name] 85 y.o. female Room/Bed: WA06/WA06  Code Status   Code Status: DNR  Home/SNF/Other Home Patient oriented to: self, place, time and situation Is this baseline? Yes   Triage Complete: Triage complete  Chief Complaint weakness   Triage Note Pt here for ongoing poor appetite, fatigue, and tiredness; denies pain or other symptoms.    Allergies No Known Allergies  Level of Care/Admitting Diagnosis ED Disposition    ED Disposition Condition [facility] Area: [place] [[id number]]  Level of Care: Med-Surg [16]  Covid Evaluation: Asymptomatic Screening Protocol (No Symptoms)  Diagnosis: AKI (acute kidney injury) [name]) [id number]  Admitting Physician: Georgette Shell [id number]  Attending Physician: Georgette Shell [id number]  Estimated length of stay: 3 - 4 days  Certification:: I certify this patient will need inpatient services for at least 2 midnights  PT Class (Do Not Modify): Inpatient [101]  PT Acc Code (Do Not Modify): Private [1]       B Medical/Surgery History Past Medical History:  Diagnosis Date  . Anemia   . Ankle edema   . Atrial fibrillation ([place])   . CAD (coronary artery disease)   . Carotid bruit    bilateral  . Chronic kidney disease   . Diverticulosis   . GERD (gastroesophageal reflux disease)   . History of blood transfusion    "probably related to anemia"  . History of kidney stones   . Hypercholesterolemia   . Hypertension   . Hypoparathyroidism ([place])   . Osteoporosis   . PAD (peripheral artery disease) ([place])   . Personal history of colonic polyps 05/01/2001   hyperplastic  . Spinal stenosis   . Vitamin D deficiency    Past Surgical History:  Procedure Laterality Date  . ABDOMINAL AORTAGRAM N/A 05/03/2014   Procedure: ABDOMINAL Maxcine Ham;  Surgeon: Elam Dutch, MD;  Location: [facility] CATH LAB;  Service:  Cardiovascular;  Laterality: N/A;  . ABDOMINAL HYSTERECTOMY    . APPENDECTOMY    . CARDIOVERSION  01/04/2003  . carotid duplex  01/10/2012   less than 50% bilat disease  . CATARACT EXTRACTION, BILATERAL Bilateral   . COLONOSCOPY    . KIDNEY STONE SURGERY Bilateral    Removal of bilateral kidney stones; "they cut me open"  . PERIPHERAL VASCULAR CATHETERIZATION N/A 07/02/2016   Procedure: Abdominal Aortogram;  Surgeon: Elam Dutch, MD;  Location: [place] CV LAB;  Service: Cardiovascular;  Laterality: N/A;  . PERIPHERAL VASCULAR CATHETERIZATION Bilateral 07/02/2016   Procedure: Lower Extremity Angiography;  Surgeon: Elam Dutch, MD;  Location: [place] CV LAB;  Service: Cardiovascular;  Laterality: Bilateral;  LIMITED TO ILIACS  . PERIPHERAL VASCULAR CATHETERIZATION Right 07/02/2016   Procedure: Peripheral Vascular Intervention;  Surgeon: Elam Dutch, MD;  Location: [place] CV LAB;  Service: Cardiovascular;  Laterality: Right;  COMMON AND EXTERNAL ILIACS  STENTS X 3  . POLYPECTOMY    . TRANSTHORACIC ECHOCARDIOGRAM  03/2004   EF normal; RA mod-severely dilated, LA mod dilated; mild MR, mild TR; aortic valve mildly sclerotic; mild pulm valve regurg     A IV Location/Drains/Wounds Patient Lines/Drains/Airways Status   Active Line/Drains/Airways    Name:   Placement date:   Placement time:   Site:   Days:   Peripheral IV 03/26/19 Right Forearm   03/26/19    -    Forearm  less than 1          Intake/Output Last 24 hours No intake or output data in the 24 hours ending 03/26/19 2122  Labs/Imaging Results for orders placed or performed during the hospital encounter of 03/26/19 (from the past 48 hour(s))  CBC     Status: Abnormal   Collection Time: 03/26/19 12:25 PM  Result Value Ref Range   WBC 6.6 4.0 - 10.5 K/uL   RBC 3.76 (L) 3.87 - 5.11 MIL/uL   Hemoglobin 10.3 (L) 12.0 - 15.0 g/dL   HCT 31.5 (L) 36.0 - 46.0 %   MCV 83.8 80.0 - 100.0 fL   MCH 27.4 26.0 - 34.0  pg   MCHC 32.7 30.0 - 36.0 g/dL   RDW 13.1 11.5 - 15.5 %   Platelets 210 150 - 400 K/uL   nRBC 0.0 0.0 - 0.2 %    Comment: Performed at [facility], [place] [address] [place], [place] [address]  Comprehensive metabolic panel     Status: Abnormal   Collection Time: 03/26/19 12:25 PM  Result Value Ref Range   Sodium 138 135 - 145 mmol/L   Potassium 4.6 3.5 - 5.1 mmol/L   Chloride 101 98 - 111 mmol/L   CO2 25 22 - 32 mmol/L   Glucose, Bld 98 70 - 99 mg/dL   BUN 87 (H) 8 - 23 mg/dL   Creatinine, Ser 3.56 (H) 0.44 - 1.00 mg/dL   Calcium 11.7 (H) 8.9 - 10.3 mg/dL   Total Protein 7.6 6.5 - 8.1 g/dL   Albumin 3.8 3.5 - 5.0 g/dL   AST 17 15 - 41 U/L   ALT 9 0 - 44 U/L   Alkaline Phosphatase 39 38 - 126 U/L   Total Bilirubin 1.0 0.3 - 1.2 mg/dL   GFR calc non Af Amer 11 (L) >60 mL/min   GFR calc Af Amer 12 (L) >60 mL/min   Anion gap 12 5 - 15    Comment: Performed at [facility], [place] [address] [place], [place] [address]  TSH     Status: None   Collection Time: 03/26/19 12:25 PM  Result Value Ref Range   TSH 3.458 0.350 - 4.500 uIU/mL    Comment: Performed by a 3rd Generation assay with a functional sensitivity of <=0.01 uIU/mL. Performed at [facility], [place] [address], [place],  [address]   Urinalysis, Routine w reflex microscopic     Status: Abnormal   Collection Time: 03/26/19  2:00 PM  Result Value Ref Range   Color, Urine STRAW (A) YELLOW   APPearance CLEAR CLEAR   Specific Gravity, Urine 1.006 1.005 - 1.030   pH 6.0 5.0 - 8.0   Glucose, UA NEGATIVE NEGATIVE mg/dL   Hgb urine dipstick SMALL (A) NEGATIVE   Bilirubin Urine NEGATIVE NEGATIVE   Ketones, ur NEGATIVE NEGATIVE mg/dL   Protein, ur NEGATIVE NEGATIVE mg/dL   Nitrite NEGATIVE NEGATIVE   Leukocytes,Ua MODERATE (A) NEGATIVE   RBC / HPF 0-5 0 - 5 RBC/hpf   WBC, UA >50 (H) 0 - 5 WBC/hpf   Bacteria, UA MANY (A) NONE SEEN   Squamous Epithelial / LPF 0-5 0  - 5   Mucus PRESENT    Budding Yeast PRESENT    Hyaline Casts, UA PRESENT     Comment: Performed at [facility], [place] [address] [place], [place] [address]  SARS CORONAVIRUS 2 (TAT 6-24 HRS) Nasopharyngeal Nasopharyngeal Swab  Status: None   Collection Time: 03/26/19  2:50 PM   Specimen: Nasopharyngeal Swab  Result Value Ref Range   SARS Coronavirus 2 NEGATIVE NEGATIVE    Comment: (NOTE) SARS-CoV-2 target nucleic acids are NOT DETECTED. The SARS-CoV-2 RNA is generally detectable in upper and lower respiratory specimens during the acute phase of infection. Negative results do not preclude SARS-CoV-2 infection, do not rule out co-infections with other pathogens, and should not be used as the sole basis for treatment or other patient management decisions. Negative results must be combined with clinical observations, patient history, and epidemiological information. The expected result is Negative. Fact Sheet for Patients: SugarRoll.be Fact Sheet for Healthcare Providers: https://www.woods-mathews.com/ This test is not yet approved or cleared by the [place] FDA and  has been authorized for detection and/or diagnosis of SARS-CoV-2 by FDA under an Emergency Use Authorization (EUA). This EUA will remain  in effect (meaning this test can be used) for the duration of the COVID-19 declaration under Section 56 4(b)(1) of the Act, 21 U.S.C. section 360bbb-3(b)(1), unless the authorization is terminated or revoked sooner. Performed at [facility], [place] [address] [place], [place] [address]    Dg Chest Port 1 View  Result Date: 03/26/2019 CLINICAL DATA:  85 year old with fatigue and anorexia. Generalized weakness. EXAM: PORTABLE CHEST 1 VIEW COMPARISON:  02/10/2008. FINDINGS: Cardiac silhouette moderately enlarged, unchanged. Thoracic aorta atherosclerotic, more so than previously. Hilar and mediastinal contours  otherwise unremarkable. Prominent bronchovascular markings focally in the UPPER lobes bilaterally, RIGHT greater than LEFT. Lungs otherwise clear. Pulmonary vascularity normal. No pleural effusions. IMPRESSION: 1. Stable cardiomegaly without pulmonary edema. 2. Acute bronchitis and/or bronchopneumonia is suspected involving the UPPER lobes bilaterally, RIGHT greater than LEFT. 3.  Aortic Atherosclerosis (ICD10-170.0) Electronically Signed   By: Evangeline Dakin M.D.   On: 03/26/2019 13:57    Pending Labs Unresulted Labs (From admission, onward)    Start     Ordered   03/27/19 0500  Comprehensive metabolic panel  Tomorrow morning,   R     03/26/19 1546   03/27/19 0500  CBC  Tomorrow morning,   R     03/26/19 1546   03/27/19 0500  Calcium, ionized  Tomorrow morning,   R     03/26/19 1612   03/26/19 1444  Urine culture  Add-on,   AD     03/26/19 1443          Vitals/Pain Today's Vitals   03/26/19 1800 03/26/19 1900 03/26/19 2000 03/26/19 2100  BP: (!) 157/80 (!) 164/81 (!) 164/70 (!) 154/78  Pulse: 65  60   Resp: 20 14 14 14   Temp:      TempSrc:      SpO2: 96%  97%   Weight:      Height:      PainSc:        Isolation Precautions No active isolations  Medications Medications  sodium chloride flush (NS) 0.9 % injection 3 mL (0 mLs Intravenous Hold 03/26/19 1236)  heparin injection 5,000 Units (has no administration in time range)  0.9 %  sodium chloride infusion ( Intravenous New Bag/Given 03/26/19 1628)  metoprolol tartrate (LOPRESSOR) tablet 25 mg (has no administration in time range)  Iron TABS (has no administration in time range)  calcitRIOL (ROCALTROL) capsule 0.25 mcg (has no administration in time range)  cefTRIAXone (ROCEPHIN) 1 g in sodium chloride 0.9 % 100 mL IVPB (has no administration in time range)  azithromycin (ZITHROMAX) 500 mg in sodium  chloride 0.9 % 250 mL IVPB (has no administration in time range)  cefTRIAXone (ROCEPHIN) 1 g in sodium chloride 0.9 % 100  mL IVPB (0 g Intravenous Stopped 03/26/19 1612)  sodium chloride 0.9 % bolus 1,000 mL (0 mLs Intravenous Stopped 03/26/19 1613)    Mobility walks with device Low fall risk   Focused Assessments    R Recommendations: See Admitting Provider Note  Report given to:   Additional Notes:

## 2019-03-26 NOTE — ED Triage Notes (Signed)
Pt here for ongoing poor appetite, fatigue, and tiredness; denies pain or other symptoms.

## 2019-03-26 NOTE — H&P (Addendum)
History and Physical    Pam [name] [id number] DOB: 1925/09/11 DOA: 03/26/2019  PCP: [name], MD Patient coming from: home  Chief Complaint:weakness  HPI: Pam [name] is a 85 y.o. female with medical history significant of CKD stage stage IV to stage V not on hemodialysis, atrial fibrillation not on anticoagulation, diverticulosis, peripheral artery disease, hypertension hypercholesterolemia spinal stenosis lives at home alone was brought in by her niece for generalized weakness.  History obtained from the patient her niece and the ER records.  Patient denied any fever chills or cough.  Denies shortness of breath or dyspnea on exertion.  She ambulates inside the house sometimes with a walker sometimes without a walker.  Denies nausea vomiting or diarrhea.  As far as she knows she has not had any sick contacts. Her niece Mardene Celeste is her healthcare power of attorney and she said the patient is a DNR. She was on anticoagulation in the past but this was stopped by her primary care physician because of melena. She was started on torsemide and hydrochlorothiazide in April 2019 for bilateral lower extremity swelling.  She has decreased appetite and unintentional weight loss not sure how much she has lost. ED Course: Patient received Rocephin. Sodium 138 potassium 4.6 BUN 87 creatinine 3.56 which is up from 2.3 in April 2019.  Hemoglobin 10.3 platelet count 210 white count 6.6 TSH 3.4.  Covid is pending UA consistent with UTI Chest x-ray shows stable cardiomegaly without pulmonary edema.  Acute bronchitis and/or bronchopneumonia is suspected involving the upper lobes bilaterally right greater than left.   Review of Systems: As per HPI otherwise all other systems reviewed and are negative  Ambulatory Status: Ambulates with and without walker at home  Past Medical History:  Diagnosis Date  . Anemia   . Ankle edema   . Atrial fibrillation ([place])   . CAD (coronary artery disease)   .  Carotid bruit    bilateral  . Chronic kidney disease   . Diverticulosis   . GERD (gastroesophageal reflux disease)   . History of blood transfusion    "probably related to anemia"  . History of kidney stones   . Hypercholesterolemia   . Hypertension   . Hypoparathyroidism ([place])   . Osteoporosis   . PAD (peripheral artery disease) ([place])   . Personal history of colonic polyps 05/01/2001   hyperplastic  . Spinal stenosis   . Vitamin D deficiency     Past Surgical History:  Procedure Laterality Date  . ABDOMINAL AORTAGRAM N/A 05/03/2014   Procedure: ABDOMINAL Maxcine Ham;  Surgeon: Elam Dutch, MD;  Location: [facility] CATH LAB;  Service: Cardiovascular;  Laterality: N/A;  . ABDOMINAL HYSTERECTOMY    . APPENDECTOMY    . CARDIOVERSION  01/04/2003  . carotid duplex  01/10/2012   less than 50% bilat disease  . CATARACT EXTRACTION, BILATERAL Bilateral   . COLONOSCOPY    . KIDNEY STONE SURGERY Bilateral    Removal of bilateral kidney stones; "they cut me open"  . PERIPHERAL VASCULAR CATHETERIZATION N/A 07/02/2016   Procedure: Abdominal Aortogram;  Surgeon: Elam Dutch, MD;  Location: [place] CV LAB;  Service: Cardiovascular;  Laterality: N/A;  . PERIPHERAL VASCULAR CATHETERIZATION Bilateral 07/02/2016   Procedure: Lower Extremity Angiography;  Surgeon: Elam Dutch, MD;  Location: [place] CV LAB;  Service: Cardiovascular;  Laterality: Bilateral;  LIMITED TO ILIACS  . PERIPHERAL VASCULAR CATHETERIZATION Right 07/02/2016   Procedure: Peripheral Vascular Intervention;  Surgeon: Elam Dutch, MD;  Location: [place] CV LAB;  Service: Cardiovascular;  Laterality: Right;  COMMON AND EXTERNAL ILIACS  STENTS X 3  . POLYPECTOMY    . TRANSTHORACIC ECHOCARDIOGRAM  03/2004   EF normal; RA mod-severely dilated, LA mod dilated; mild MR, mild TR; aortic valve mildly sclerotic; mild pulm valve regurg    Social History   Socioeconomic History  . Marital status: Widowed    Spouse name:  Not on file  . Number of children: 0  . Years of education: Not on file  . Highest education level: Not on file  Occupational History    Employer: RETIRED  Social Needs  . Financial resource strain: Not on file  . Food insecurity    Worry: Not on file    Inability: Not on file  . Transportation needs    Medical: Not on file    Non-medical: Not on file  Tobacco Use  . Smoking status: Former Smoker    Packs/day: 0.12    Years: 65.00    Pack years: 7.80    Types: Cigarettes    Quit date: 08/01/2014    Years since quitting: 4.6  . Smokeless tobacco: Never Used  . Tobacco comment: "started smoking early in my ?49s"  Substance and Sexual Activity  . Alcohol use: Yes    Alcohol/week: 4.0 standard drinks    Types: 4 Shots of liquor per week    Comment: 07/01/2016 "I drink boubon on the weekend"  . Drug use: No  . Sexual activity: Never  Lifestyle  . Physical activity    Days per week: Not on file    Minutes per session: Not on file  . Stress: Not on file  Relationships  . Social Herbalist on phone: Not on file    Gets together: Not on file    Attends religious service: Not on file    Active member of club or organization: Not on file    Attends meetings of clubs or organizations: Not on file    Relationship status: Not on file  . Intimate partner violence    Fear of current or ex partner: Not on file    Emotionally abused: Not on file    Physically abused: Not on file    Forced sexual activity: Not on file  Other Topics Concern  . Not on file  Social History Narrative  . Not on file    No Known Allergies  Family History  Problem Relation Age of Onset  . Heart disease Mother   . Heart disease Father   . Colon cancer Neg Hx     Prior to Admission medications   Medication Sig Start Date End Date Taking? Authorizing Provider  calcitRIOL (ROCALTROL) 0.25 MCG capsule Take 0.25 mcg by mouth daily. 09/07/17   [provider]  hydrochlorothiazide  (MICROZIDE) 12.5 MG capsule Take 1 capsule (12.5 mg total) by mouth daily. 07/05/16   Alvia Grove, PA-C  IRON PO Take 1 tablet by mouth daily.    [provider]  metoprolol tartrate (LOPRESSOR) 25 MG tablet Take 25 mg by mouth 2 (two) times daily.  06/18/16   [provider]  polyethylene glycol (MIRALAX / GLYCOLAX) packet Take 17 g by mouth daily. 11/22/16   Dessa Phi, DO  torsemide (DEMADEX) 10 MG tablet Take 1 tablet (10 mg total) by mouth daily. 07/05/16   Alvia Grove, PA-C    Physical Exam: Elderly frail female in no acute distress Vitals:  03/26/19 1232 03/26/19 1300 03/26/19 1400 03/26/19 1500  BP:  (!) 145/80 (!) 149/71 (!) 155/81  Pulse:  71 62 64  Resp:  20 18 17   Temp: 97.6 F (36.4 C)     TempSrc: Oral     SpO2:  96% 95% 96%  Weight:      Height:         . General:  Appears calm and comfortable . Eyes:  PERRL, EOMI, normal lids, iris . ENT:  grossly normal hearing, lips & tongue, oral mucosa dry . Neck:  no LAD, masses or thyromegaly . Cardiovascular:  RRR, no m/r/g. No LE edema.  Marland Kitchen Respiratory: Few scattered wheezes  bilaterally, no w/r/r. Normal respiratory effort. . Abdomen:  soft, ntnd, NABS . Skin:  no rash or induration seen on limited exam . Musculoskeletal: grossly normal tone BUE/BLE, good ROM, no bony abnormality . Psychiatric:  grossly normal mood and affect, speech fluent and appropriate, AOx3 . Neurologic: CN 2-12 grossly intact, moves all extremities in coordinated fashion, sensation intact  Labs on Admission: I have personally reviewed following labs and imaging studies  CBC: Recent Labs  Lab 03/26/19 1225  WBC 6.6  HGB 10.3*  HCT 31.5*  MCV 83.8  PLT [id number]   Basic Metabolic Panel: Recent Labs  Lab 03/26/19 1225  NA 138  K 4.6  CL 101  CO2 25  GLUCOSE 98  BUN 87*  CREATININE 3.56*  CALCIUM 11.7*   GFR: Estimated Creatinine Clearance: 7.4 mL/min (A) (by C-G formula based on SCr of 3.56 mg/dL (H)).  Liver Function Tests: Recent Labs  Lab 03/26/19 1225  AST 17  ALT 9  ALKPHOS 39  BILITOT 1.0  PROT 7.6  ALBUMIN 3.8   No results for input(s): LIPASE, AMYLASE in the last 168 hours. No results for input(s): AMMONIA in the last 168 hours. Coagulation Profile: No results for input(s): INR, PROTIME in the last 168 hours. Cardiac Enzymes: No results for input(s): CKTOTAL, CKMB, CKMBINDEX, TROPONINI in the last 168 hours. BNP (last 3 results) No results for input(s): PROBNP in the last 8760 hours. HbA1C: No results for input(s): HGBA1C in the last 72 hours. CBG: No results for input(s): GLUCAP in the last 168 hours. Lipid Profile: No results for input(s): CHOL, HDL, LDLCALC, TRIG, CHOLHDL, LDLDIRECT in the last 72 hours. Thyroid Function Tests: Recent Labs    03/26/19 1225  TSH 3.458   Anemia Panel: No results for input(s): VITAMINB12, FOLATE, FERRITIN, TIBC, IRON, RETICCTPCT in the last 72 hours. Urine analysis:    Component Value Date/Time   COLORURINE STRAW (A) 03/26/2019 1400   APPEARANCEUR CLEAR 03/26/2019 1400   LABSPEC 1.006 03/26/2019 1400   LABSPEC 1.015 11/28/2008 1133   PHURINE 6.0 03/26/2019 1400   GLUCOSEU NEGATIVE 03/26/2019 1400   HGBUR SMALL (A) 03/26/2019 1400   BILIRUBINUR NEGATIVE 03/26/2019 1400   BILIRUBINUR Negative 11/28/2008 1133   KETONESUR NEGATIVE 03/26/2019 1400   PROTEINUR NEGATIVE 03/26/2019 1400   UROBILINOGEN 0.2 10/30/2011 1653   NITRITE NEGATIVE 03/26/2019 1400   LEUKOCYTESUR MODERATE (A) 03/26/2019 1400   LEUKOCYTESUR Negative 11/28/2008 1133    Creatinine Clearance: Estimated Creatinine Clearance: 7.4 mL/min (A) (by C-G formula based on SCr of 3.56 mg/dL (H)).  Sepsis Labs: @LABRCNTIP (procalcitonin:4,lacticidven:4) )No results found for this or any previous visit (from the past 240 hour(s)).   Radiological Exams on Admission: Dg Chest Port 1 View  Result Date: 03/26/2019 CLINICAL DATA:  85 year old with fatigue and  anorexia. Generalized weakness. EXAM: PORTABLE  CHEST 1 VIEW COMPARISON:  02/10/2008. FINDINGS: Cardiac silhouette moderately enlarged, unchanged. Thoracic aorta atherosclerotic, more so than previously. Hilar and mediastinal contours otherwise unremarkable. Prominent bronchovascular markings focally in the UPPER lobes bilaterally, RIGHT greater than LEFT. Lungs otherwise clear. Pulmonary vascularity normal. No pleural effusions. IMPRESSION: 1. Stable cardiomegaly without pulmonary edema. 2. Acute bronchitis and/or bronchopneumonia is suspected involving the UPPER lobes bilaterally, RIGHT greater than LEFT. 3.  Aortic Atherosclerosis (ICD10-170.0) Electronically Signed   By: Evangeline Dakin M.D.   On: 03/26/2019 13:57    Assessment/Plan Active Problems:   * No active hospital problems. *   #1 dehydration-patient lives alone and her p.o. intake is not very good.  In addition to that she is on hydrochlorothiazide 12.5 mg daily with torsemide 10 mg daily.  I will hold HCTZ and torsemide and hydrate her with normal saline 75 cc an hour.  #2 AKI on CKD stage III-slow hydration and follow-up labs.  She does not have a history of congestive heart failure that I can see her last echo was in 2014.  #3 possible community-acquired pneumonia by chest x-ray -she has received Rocephin in the ER I will add azithromycin.  Follow clinically.  Chest x-ray shows bilateral upper lobe density.  #4 UTI follow-up urine culture continue Rocephin  #5 hypertension continue beta-blocker  #6 hypercalcemia due to dehydration follow-up after hydration if does not improve?  Consider malignancy with unintentional weight loss and decreased appetite.  #7 chronic atrial fibrillation not on anticoagulation due to melena rate controlled on beta-blocker.  Severity of Illness: The appropriate patient status for this patient is INPATIENT. Inpatient status is judged to be reasonable and necessary in order to provide the required  intensity of service to ensure the patient's safety. The patient's presenting symptoms, physical exam findings, and initial radiographic and laboratory data in the context of their chronic comorbidities is felt to place them at high risk for further clinical deterioration. Furthermore, it is not anticipated that the patient will be medically stable for discharge from the hospital within 2 midnights of admission. The following factors support the patient status of inpatient.   " The patient's presenting symptoms include generalized weakness decreased appetite and weight loss. " The worrisome physical exam findings dehydrated dry mucous membrane scattered rhonchi in both lung fields " The initial radiographic and laboratory data are worrisome because of UA consistent with UTI chest x-ray with bilateral upper lobe densities " The chronic co-morbidities include CAD PAD A. fib old age   * I certify that at the point of admission it is my clinical judgment that the patient will require inpatient hospital care spanning beyond 2 midnights from the point of admission due to high intensity of service, high risk for further deterioration and high frequency of surveillance required.*   Estimated body mass index is 19.53 kg/m as calculated from the following:   Height as of this encounter: 5' (1.524 m).   Weight as of this encounter: 45.4 kg.   DVT prophylaxis: Heparin Code Status: DO NOT RESUSCITATE Family Communication: Discussed with her niece Mardene Celeste Disposition Plan: Pending clinical improvement Consults called: None Admission status: Inpatient   Georgette Shell MD [facility]  If 7PM-7AM, please contact night-coverage [email or web address] Password [email or web address]  03/26/2019, 3:41 PM

## 2019-03-26 NOTE — ED Notes (Signed)
Pt did not receive a dinner tray, so a [place] sandwich was provided to the pt with orange juice and water.

## 2019-03-27 DIAGNOSIS — N179 Acute kidney failure, unspecified: Secondary | ICD-10-CM | POA: Diagnosis not present

## 2019-03-27 DIAGNOSIS — J189 Pneumonia, unspecified organism: Secondary | ICD-10-CM | POA: Diagnosis not present

## 2019-03-27 DIAGNOSIS — N390 Urinary tract infection, site not specified: Secondary | ICD-10-CM

## 2019-03-27 DIAGNOSIS — R531 Weakness: Secondary | ICD-10-CM | POA: Diagnosis not present

## 2019-03-27 LAB — CBC
HCT: 28.4 % — ABNORMAL LOW (ref 36.0–46.0)
Hemoglobin: 9.3 g/dL — ABNORMAL LOW (ref 12.0–15.0)
MCH: 27.2 pg (ref 26.0–34.0)
MCHC: 32.7 g/dL (ref 30.0–36.0)
MCV: 83 fL (ref 80.0–100.0)
Platelets: 176 10*3/uL (ref 150–400)
RBC: 3.42 MIL/uL — ABNORMAL LOW (ref 3.87–5.11)
RDW: 12.9 % (ref 11.5–15.5)
WBC: 6.1 10*3/uL (ref 4.0–10.5)
nRBC: 0 % (ref 0.0–0.2)

## 2019-03-27 LAB — COMPREHENSIVE METABOLIC PANEL
ALT: 11 U/L (ref 0–44)
AST: 16 U/L (ref 15–41)
Albumin: 3.4 g/dL — ABNORMAL LOW (ref 3.5–5.0)
Alkaline Phosphatase: 31 U/L — ABNORMAL LOW (ref 38–126)
Anion gap: 10 (ref 5–15)
BUN: 77 mg/dL — ABNORMAL HIGH (ref 8–23)
CO2: 24 mmol/L (ref 22–32)
Calcium: 10.5 mg/dL — ABNORMAL HIGH (ref 8.9–10.3)
Chloride: 105 mmol/L (ref 98–111)
Creatinine, Ser: 2.94 mg/dL — ABNORMAL HIGH (ref 0.44–1.00)
GFR calc Af Amer: 15 mL/min — ABNORMAL LOW (ref >60)
GFR calc non Af Amer: 13 mL/min — ABNORMAL LOW (ref >60)
Glucose, Bld: 82 mg/dL (ref 70–99)
Potassium: 4.1 mmol/L (ref 3.5–5.1)
Sodium: 139 mmol/L (ref 135–145)
Total Bilirubin: 0.9 mg/dL (ref 0.3–1.2)
Total Protein: 6.6 g/dL (ref 6.5–8.1)

## 2019-03-27 NOTE — Progress Notes (Signed)
PROGRESS NOTE    [place] SCREEN  [id number] DOB: December 30, 1925 DOA: 03/26/2019 PCP: [name], MD    Brief Narrative:   Pam [name] is a 85 y.o. 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  History obtained from the patient her niece and the ER records.  Patient denied any fever chills or cough.  Denies shortness of breath or dyspnea on exertion.  She ambulates inside the house sometimes with a walker sometimes without a walker.  Denies nausea vomiting or diarrhea. As far as she knows she has not had any sick contacts. Her niece Mardene Celeste is her healthcare power of attorney and she said the patient is a DNR. She was on anticoagulation in the past but this was stopped by her primary care physician because of melena. She was started on torsemide and hydrochlorothiazide in April 2019 for bilateral lower extremity swelling. She has decreased appetite and unintentional weight loss not sure how much she has lost.  ED Course: Patient received Rocephin. Sodium 138 potassium 4.6 BUN 87 creatinine 3.56 which is up from 2.3 in April 2019.  Hemoglobin 10.3 platelet count 210 white count 6.6 TSH 3.4. Covid is pending. UA consistent with UTI Chest x-ray shows stable cardiomegaly without pulmonary edema.  Acute bronchitis and/or bronchopneumonia is suspected involving the upper lobes bilaterally right greater than left.  Assessment & Plan:   Principal Problem:   Community acquired pneumonia Active Problems:   AKI (acute kidney injury) ([place])   Weakness   Acute lower UTI   Acute on chronic kidney disease stage IV/V Creatinine on admission elevated to 3.56, last 2.37 on 09/11/2017 with a GFR of 20.  Etiology likely secondary to dehydration from poor oral intake  coupled with pneumonia/UTI. --Cr 3.56-->2.94 --Hold home HCTZ/torsemide --Strict I's and O's and daily weights --Follow renal function daily  Acute cystitis without hematuria Urinalysis with moderate leukoesterase, negative nitrite, many bacteria, greater than 50 WBC. --Urine culture: Pending --Continue ceftriaxone  Community-acquired pneumonia Chest x-ray with cardiomegaly with pulmonary edema and acute bronchitis versus bronchopneumonia to bilateral upper lobes; right greater than left. --Continue azithromycin and ceftriaxone  Weakness/deconditioning Patient currently lives alone, with decreased mobility and weakness. --PT/OT for evaluation --May need SNF versus home health.  Essential hypertension --Continue metoprolol tartrate 25 mg p.o. twice daily --Home torsemide and HCTZ for AKI as above   DVT prophylaxis: Heparin Code Status: DNR Family Communication: none Disposition Plan: Continue inpatient, IV antibiotics, therapy assessment pending, further depending on clinical course   Consultants:   None  Procedures:   None  Antimicrobials:  Azithromycin 10/26>>  Ceftriaxone 10/26>>   Subjective: Patient seen and examined at bedside, resting comfortably.  No acute complaints this morning.  Continues with generalized fatigue and weakness. Mild nonproductive cough with associated dyspnea.  Denies headache, no fever/chills/night sweats, no nausea/vomiting/diarrhea, no chest pain, no abdominal pain, no congestion, no paresthesias.  No acute events overnight per nursing staff.  Objective: Vitals:   03/26/19 2100 03/26/19 2234 03/27/19 0632 03/27/19 1343  BP: (!) 154/78 (!) 155/72 (!) 166/75 (!) 148/61  Pulse:  64 (!) 55 (!) 52  Resp: 14 16 17 20   Temp:  98.1 F (36.7 C) 97.9 F (36.6 C) (!) 97.5 F (36.4 C)  TempSrc:  Oral Oral Oral  SpO2:  99% 94% 99%  Weight:      Height:        Intake/Output Summary (Last 24 hours) at 03/27/2019 1558 Last data filed at  03/27/2019 1027 Gross per 24 hour  Intake 1604.4 ml  Output -  Net 1604.4 ml   Filed Weights   03/26/19 1206  Weight: 45.4 kg    Examination:  General exam: Appears calm and comfortable, thin in appearance Respiratory system: Breath sounds slightly decreased bilateral bases with mild crackles, no wheezing, normal respiratory effort on room air Cardiovascular system: S1 & S2 heard, RRR. No JVD, murmurs, rubs, gallops or clicks. No pedal edema. Gastrointestinal system: Abdomen is nondistended, soft and nontender. No organomegaly or masses felt. Normal bowel sounds heard. Central nervous system: Alert and oriented. No focal neurological deficits. Extremities: Symmetric 5 x 5 power. Skin: No rashes, lesions or ulcers Psychiatry: Judgement and insight appear poor. Mood & affect appropriate.     Data Reviewed: I have personally reviewed following labs and imaging studies  CBC: Recent Labs  Lab 03/26/19 1225 03/27/19 0608  WBC 6.6 6.1  HGB 10.3* 9.3*  HCT 31.5* 28.4*  MCV 83.8 83.0  PLT 210 0000000   Basic Metabolic Panel: Recent Labs  Lab 03/26/19 1225 03/27/19 0608  NA 138 139  K 4.6 4.1  CL 101 105  CO2 25 24  GLUCOSE 98 82  BUN 87* 77*  CREATININE 3.56* 2.94*  CALCIUM 11.7* 10.5*   GFR: Estimated Creatinine Clearance: 8.9 mL/min (A) (by C-G formula based on SCr of 2.94 mg/dL (H)). Liver Function Tests: Recent Labs  Lab 03/26/19 1225 03/27/19 0608  AST 17 16  ALT 9 11  ALKPHOS 39 31*  BILITOT 1.0 0.9  PROT 7.6 6.6  ALBUMIN 3.8 3.4*   No results for input(s): LIPASE, AMYLASE in the last 168 hours. No results for input(s): AMMONIA in the last 168 hours. Coagulation Profile: No results for input(s): INR, PROTIME in the last 168 hours. Cardiac Enzymes: No results for input(s): CKTOTAL, CKMB, CKMBINDEX, TROPONINI in the last 168 hours. BNP (last 3 results) No results for input(s): PROBNP in the last 8760 hours. HbA1C: No results for input(s): HGBA1C in the  last 72 hours. CBG: No results for input(s): GLUCAP in the last 168 hours. Lipid Profile: No results for input(s): CHOL, HDL, LDLCALC, TRIG, CHOLHDL, LDLDIRECT in the last 72 hours. Thyroid Function Tests: Recent Labs    03/26/19 1225  TSH 3.458   Anemia Panel: No results for input(s): VITAMINB12, FOLATE, FERRITIN, TIBC, IRON, RETICCTPCT in the last 72 hours. Sepsis Labs: No results for input(s): PROCALCITON, LATICACIDVEN in the last 168 hours.  Recent Results (from the past 240 hour(s))  SARS CORONAVIRUS 2 (TAT 6-24 HRS) Nasopharyngeal Nasopharyngeal Swab     Status: None   Collection Time: 03/26/19  2:50 PM   Specimen: Nasopharyngeal Swab  Result Value Ref Range Status   SARS Coronavirus 2 NEGATIVE NEGATIVE Final    Comment: (NOTE) SARS-CoV-2 target nucleic acids are NOT DETECTED. The SARS-CoV-2 RNA is generally detectable in upper and lower respiratory specimens during the acute phase of infection. Negative results do not preclude SARS-CoV-2 infection, do not rule out co-infections with other pathogens, and should not be used as the sole basis for treatment or other patient management decisions. Negative results must be combined with clinical observations, patient history, and epidemiological information. The expected result is Negative. Fact Sheet for Patients: SugarRoll.be Fact Sheet for Healthcare Providers: https://www.woods-mathews.com/ This test is not yet approved or cleared by the  [place] Architectural technologist and  has been authorized for detection and/or diagnosis of SARS-CoV-2 by FDA under an Print production planner (EUA). This EUA will remain  in effect (meaning this test can be used) for the duration of the COVID-19 declaration under Section 56 4(b)(1) of the Act, 21 U.S.C. section 360bbb-3(b)(1), unless the authorization is terminated or revoked sooner. Performed at [facility], [place] [address] [place], [place]  [address]          Radiology Studies: Dg Chest Port 1 View  Result Date: 03/26/2019 CLINICAL DATA:  85 year old with fatigue and anorexia. Generalized weakness. EXAM: PORTABLE CHEST 1 VIEW COMPARISON:  02/10/2008. FINDINGS: Cardiac silhouette moderately enlarged, unchanged. Thoracic aorta atherosclerotic, more so than previously. Hilar and mediastinal contours otherwise unremarkable. Prominent bronchovascular markings focally in the UPPER lobes bilaterally, RIGHT greater than LEFT. Lungs otherwise clear. Pulmonary vascularity normal. No pleural effusions. IMPRESSION: 1. Stable cardiomegaly without pulmonary edema. 2. Acute bronchitis and/or bronchopneumonia is suspected involving the UPPER lobes bilaterally, RIGHT greater than LEFT. 3.  Aortic Atherosclerosis (ICD10-170.0) Electronically Signed   By: Evangeline Dakin M.D.   On: 03/26/2019 13:57        Scheduled Meds: . calcitRIOL  0.25 mcg Oral Daily  . ferrous gluconate   Oral Daily  . heparin  5,000 Units Subcutaneous Q8H  . metoprolol tartrate  25 mg Oral BID  . sodium chloride flush  3 mL Intravenous Once   Continuous Infusions: . sodium chloride 75 mL/hr at 03/26/19 1628  . azithromycin 500 mg (03/26/19 2300)  . cefTRIAXone (ROCEPHIN)  IV 1 g (03/27/19 1501)     LOS: 1 day    Time spent: 35 minutes spent on chart review, discussion with nursing staff, consultants, updating family and interview/physical exam; more than 50% of that time was spent in counseling and/or coordination of care.    Sonja Manseau J [place] DO [facility] 03/27/2019, 3:58 PM

## 2019-03-27 NOTE — Evaluation (Signed)
Physical Therapy Evaluation Patient Details Name: Pam [name] MRN: [id number] DOB: 10/13/1925 Today's Date: 03/27/2019   History of Present Illness  Patient is 85 y.o. female with medical history significant of CKD stage stage IV to stage V not on hemodialysis, atrial fibrillation not on anticoagulation, diverticulosis, peripheral artery disease, hypertension hypercholesterolemia spinal stenosis lives at home alone was brought in by her niece for generalized weakness    Clinical Impression  Pam [name] is 85 y.o. female admitted with above HPI and diagnosis. Patient is currently limited by functional impairments below (see PT problem list). Patient lives alone and reports modified independence with rollator at baseline for mobility but does require some assistance for ADL's per niece. Patient was educated on concern for fall risk at home with mobilizing and need for 24/7 supervision/assistance with mobility. Educated on option to receive care at [facility] and patient wishes to return home, discussed with pt and her niece that she would benefit from 24/7 supervision at home with all mobility for safety. Patient will benefit from continued skilled PT interventions to address impairments and progress independence with mobility, recommending HHPT at this time with 24/7 supervision. Acute PT will follow and progress as able.     Follow Up Recommendations Home health PT;Supervision/Assistance - 24 hour    Equipment Recommendations  None recommended by PT    Recommendations for Other Services OT consult     Precautions / Restrictions Precautions Precautions: Fall Restrictions Weight Bearing Restrictions: No      Mobility  Bed Mobility Overal bed mobility: Needs Assistance Bed Mobility: Supine to Sit     Supine to sit: Supervision     General bed mobility comments: pt performed from flat bed, using bed rails, pt very slow to move and scoot to EOB to palce feet flat on  floor  Transfers Overall transfer level: Needs assistance Equipment used: Rolling walker (2 wheeled) Transfers: Sit to/from Stand Sit to Stand: Min assist         General transfer comment: pt requires cues for safe hand placement and technique with RW and cues for safe sequencing, assist to initiate power up required from EOB, recliner, and toilet  Ambulation/Gait Ambulation/Gait assistance: Min assist Gait Distance (Feet): 90 Feet Assistive device: Rolling walker (2 wheeled) Gait Pattern/deviations: Step-to pattern;Decreased step length - right;Decreased step length - left;Decreased stride length;Narrow base of support;Trunk flexed;Scissoring Gait velocity: slow indicative of fall risk Gait velocity interpretation: <1.31 ft/sec, indicative of household ambulator General Gait Details: pt unsteady during gait with RW and required min assist to steady and prevent LOB; pt with multiple occasions of LE buckling slightly and required min assist to support, on some occasions pt able to support herself wtih RW and prevent LOB, pt with severe kyphosis and postural limitations impairing her ability to navigate environment  Stairs            Wheelchair Mobility    Modified Rankin (Stroke Patients Only)       Balance Overall balance assessment: Needs assistance Sitting-balance support: Feet supported;Single extremity supported;No upper extremity supported Sitting balance-Leahy Scale: Fair   Postural control: Posterior lean Standing balance support: During functional activity;Bilateral upper extremity supported Standing balance-Leahy Scale: Fair Standing balance comment: pt was able to maintain stading with no support while cleansing hand at sink in bathroom and drying them with towel, pt unsteady however no LOB with static stand, she requires support for dynamic activities including reaching outside BOS  Pertinent Vitals/Pain Pain Assessment: No/denies pain     Home Living Family/patient expects to be discharged to:: Private residence Living Arrangements: Alone Available Help at Discharge: Family;Available PRN/intermittently(neice checks in daily in AM and some other family intermittently) Type of Home: House Home Access: Stairs to enter Entrance Stairs-Rails: Can reach both;Right Entrance Stairs-Number of Steps: pt has 3 steps to enter through garage and through den with Rt hand rail going up; pt also has more steps at front with 2 hand rails Home Layout: One level Home Equipment: [place] - 4 wheels;Bedside commode Additional Comments: pt has a grab bar/rail on the tub that she used to use to get in/out of the tub, Pt reports she has been using it still, pt's neice states she does not and has been doing sink baths    Prior Function Level of Independence: Independent with assistive device(s)         Comments: pts neice does food shopping and pt does her own cooking; pt reports she is independent with meal prep, bathing, and dressing     Hand Dominance   Dominant Hand: Right    Extremity/Trunk Assessment   Upper Extremity Assessment Upper Extremity Assessment: Generalized weakness    Lower Extremity Assessment Lower Extremity Assessment: Generalized weakness    Cervical / Trunk Assessment Cervical / Trunk Assessment: Kyphotic  Communication   Communication: No difficulties  Cognition Arousal/Alertness: Awake/alert Behavior During Therapy: WFL for tasks assessed/performed Overall Cognitive Status: Within Functional Limits for tasks assessed           General Comments      Exercises     Assessment/Plan    PT Assessment Patient needs continued PT services  PT Problem List Decreased strength;Decreased balance;Decreased mobility;Decreased range of motion;Decreased activity tolerance;Decreased knowledge of use of DME;Decreased safety awareness;Decreased coordination       PT Treatment Interventions DME  instruction;Functional mobility training;Balance training;Patient/family education;Modalities;Therapeutic activities;Gait training;Stair training;Therapeutic exercise    PT Goals (Current goals can be found in the Care Plan section)  Acute Rehab PT Goals Patient Stated Goal: to go back home PT Goal Formulation: With patient Time For Goal Achievement: 04/10/19 Potential to Achieve Goals: Fair    Frequency Min 3X/week    AM-PAC PT "6 Clicks" Mobility  Outcome Measure Help needed turning from your back to your side while in a flat bed without using bedrails?: A Little Help needed moving from lying on your back to sitting on the side of a flat bed without using bedrails?: A Little Help needed moving to and from a bed to a chair (including a wheelchair)?: A Little Help needed standing up from a chair using your arms (e.g., wheelchair or bedside chair)?: A Little Help needed to walk in hospital room?: A Little Help needed climbing 3-5 steps with a railing? : A Lot 6 Click Score: 17    End of Session Equipment Utilized During Treatment: Gait belt Activity Tolerance: Patient tolerated treatment well Patient left: with call bell/phone within reach;in chair;with chair alarm set(alarm set up, notified RN of batteries being dead) Nurse Communication: Mobility status PT Visit Diagnosis: Muscle weakness (generalized) (M62.81);Difficulty in walking, not elsewhere classified (R26.2);Unsteadiness on feet (R26.81);Other abnormalities of gait and mobility (R26.89)    Time: [id number] PT Time Calculation (min) (ACUTE ONLY): 52 min   Charges:   PT Evaluation $PT Eval Moderate Complexity: 1 Mod PT Treatments $Gait Training: 8-22 mins $Therapeutic Activity: 8-22 mins        Kipp Brood, PT, DPT Physical Therapist with  [facility]  03/27/2019 1:08 PM

## 2019-03-28 DIAGNOSIS — J189 Pneumonia, unspecified organism: Secondary | ICD-10-CM | POA: Diagnosis not present

## 2019-03-28 DIAGNOSIS — N184 Chronic kidney disease, stage 4 (severe): Secondary | ICD-10-CM

## 2019-03-28 DIAGNOSIS — N179 Acute kidney failure, unspecified: Secondary | ICD-10-CM | POA: Diagnosis not present

## 2019-03-28 DIAGNOSIS — N390 Urinary tract infection, site not specified: Secondary | ICD-10-CM | POA: Diagnosis not present

## 2019-03-28 LAB — CALCIUM, IONIZED: Calcium, Ionized, Serum: 6 mg/dL — ABNORMAL HIGH (ref 4.5–5.6)

## 2019-03-28 LAB — BASIC METABOLIC PANEL
Anion gap: 11 (ref 5–15)
BUN: 61 mg/dL — ABNORMAL HIGH (ref 8–23)
CO2: 22 mmol/L (ref 22–32)
Calcium: 10.4 mg/dL — ABNORMAL HIGH (ref 8.9–10.3)
Chloride: 107 mmol/L (ref 98–111)
Creatinine, Ser: 2.58 mg/dL — ABNORMAL HIGH (ref 0.44–1.00)
GFR calc Af Amer: 18 mL/min — ABNORMAL LOW (ref >60)
GFR calc non Af Amer: 16 mL/min — ABNORMAL LOW (ref >60)
Glucose, Bld: 78 mg/dL (ref 70–99)
Potassium: 4.2 mmol/L (ref 3.5–5.1)
Sodium: 140 mmol/L (ref 135–145)

## 2019-03-28 LAB — URINE CULTURE

## 2019-03-28 LAB — CBC
HCT: 29.4 % — ABNORMAL LOW (ref 36.0–46.0)
Hemoglobin: 10 g/dL — ABNORMAL LOW (ref 12.0–15.0)
MCH: 27.6 pg (ref 26.0–34.0)
MCHC: 34 g/dL (ref 30.0–36.0)
MCV: 81.2 fL (ref 80.0–100.0)
Platelets: 171 10*3/uL (ref 150–400)
RBC: 3.62 MIL/uL — ABNORMAL LOW (ref 3.87–5.11)
RDW: 12.9 % (ref 11.5–15.5)
WBC: 7.3 10*3/uL (ref 4.0–10.5)
nRBC: 0 % (ref 0.0–0.2)

## 2019-03-28 LAB — MAGNESIUM: Magnesium: 1.8 mg/dL (ref 1.7–2.4)

## 2019-03-28 MED ORDER — AZITHROMYCIN 250 MG PO TABS
500.0000 mg | ORAL_TABLET | Freq: Every day | ORAL | Status: DC
  Administered 2019-03-28: 500 mg via ORAL
  Filled 2019-03-28 (×3): qty 2

## 2019-03-28 NOTE — Evaluation (Signed)
Occupational Therapy Evaluation Patient Details Name: Pam [name] MRN: [id number] DOB: 25-Mar-1926 Today's Date: 03/28/2019    History of Present Illness Patient is 85 y.o. female with medical history significant of CKD stage stage IV to stage V not on hemodialysis, atrial fibrillation not on anticoagulation, diverticulosis, peripheral artery disease, hypertension hypercholesterolemia spinal stenosis lives at home alone was brought in by her niece for generalized weakness   Clinical Impression   Pt admitted with pneumonia. Pt currently with functional limitations due to the deficits listed below (see OT Problem List).  Pt will benefit from skilled OT to increase their safety and independence with ADL and functional mobility for ADL to facilitate discharge to venue listed below.   Spoke with pts niece who is arranging 24/7 care for pt at home     Follow Up Recommendations  Home health OT;Supervision/Assistance - 24 hour    Equipment Recommendations  3 in 1 bedside commode    Recommendations for Other Services       Precautions / Restrictions Precautions Precautions: Fall Restrictions Weight Bearing Restrictions: No      Mobility Bed Mobility Overal bed mobility: Needs Assistance Bed Mobility: Supine to Sit     Supine to sit: Min assist        Transfers Overall transfer level: Needs assistance Equipment used: Rolling walker (2 wheeled) Transfers: Sit to/from [facility] Sit to Stand: Mod assist         General transfer comment: pt needed increased A this day/ Pts knees very weak.    Balance Overall balance assessment: Needs assistance Sitting-balance support: Feet supported;Single extremity supported;No upper extremity supported Sitting balance-Leahy Scale: Fair   Postural control: Posterior lean Standing balance support: During functional activity;Bilateral upper extremity supported Standing balance-Leahy Scale: Poor                             ADL either performed or assessed with clinical judgement   ADL Overall ADL's : Needs assistance/impaired Eating/Feeding: Set up;Sitting   Grooming: Set up;Sitting   Upper Body Bathing: Set up;Sitting   Lower Body Bathing: Maximal assistance;Sit to/from stand;Cueing for safety;Cueing for compensatory techniques   Upper Body Dressing : Set up;Sitting   Lower Body Dressing: Maximal assistance;Sit to/from stand;Cueing for safety;Cueing for compensatory techniques   Toilet Transfer: Moderate assistance;BSC;RW;Stand-pivot;Cueing for safety;Cueing for sequencing   Toileting- Clothing Manipulation and Hygiene: Moderate assistance;Sitting/lateral lean;Sit to/from stand;Cueing for safety;Cueing for compensatory techniques         General ADL Comments: spoke with pts niece.  they are working on arranging care for pt at home.  Explained pt would need hands on A with all transfers and not just S     Vision Patient Visual Report: No change from baseline       Perception     Praxis      Pertinent Vitals/Pain Pain Assessment: No/denies pain     Hand Dominance Right   Extremity/Trunk Assessment         Cervical / Trunk Assessment Cervical / Trunk Assessment: Kyphotic   Communication Communication Communication: No difficulties   Cognition Arousal/Alertness: Awake/alert Behavior During Therapy: WFL for tasks assessed/performed Overall Cognitive Status: Within Functional Limits for tasks assessed  Home Living Family/patient expects to be discharged to:: Private residence Living Arrangements: Alone Available Help at Discharge: Family;Available PRN/intermittently(neice checks in daily in AM and some other family intermittently) Type of Home: House Home Access: Stairs to enter [employer] of Steps: pt has 3 steps to enter through garage and through den with Rt hand rail going up; pt  also has more steps at front with 2 hand rails Entrance Stairs-Rails: Can reach both;Right Home Layout: One level     Bathroom Shower/Tub: Teacher, early years/pre: Standard Bathroom Accessibility: Yes   Home Equipment: Environmental consultant - 4 wheels;Bedside commode   Additional Comments: pt has a grab bar/rail on the tub that she used to use to get in/out of the tub, Pt reports she has been using it still, pt's neice states she does not and has been doing sink baths      Prior Functioning/Environment Level of Independence: Independent with assistive device(s)        Comments: pts neice does food shopping and pt does her own cooking; pt reports she is independent with meal prep, bathing, and dressing        OT Problem List: Decreased strength;Decreased activity tolerance;Impaired balance (sitting and/or standing);Decreased safety awareness;Decreased knowledge of use of DME or AE      OT Treatment/Interventions: Self-care/ADL training;Patient/family education;DME and/or AE instruction;Therapeutic activities    OT Goals(Current goals can be found in the care plan section) Acute Rehab OT Goals Patient Stated Goal: to go back home OT Goal Formulation: With patient/family Time For Goal Achievement: 04/03/19 Potential to Achieve Goals: Good ADL Goals Pt Will Perform Lower Body Dressing: with min assist;sit to/from stand Pt Will Transfer to Toilet: with min assist;bedside commode Pt Will Perform Toileting - Clothing Manipulation and hygiene: with min assist;sit to/from stand;sitting/lateral leans  OT Frequency: Min 2X/week   Barriers to D/C:               AM-PAC OT "6 Clicks" Daily Activity     Outcome Measure Help from another person eating meals?: None Help from another person taking care of personal grooming?: None Help from another person toileting, which includes using toliet, bedpan, or urinal?: A Lot Help from another person bathing (including washing, rinsing,  drying)?: None Help from another person to put on and taking off regular upper body clothing?: A Little Help from another person to put on and taking off regular lower body clothing?: A Lot 6 Click Score: 19   End of Session Equipment Utilized During Treatment: Rolling walker;Gait belt Nurse Communication: Mobility status  Activity Tolerance: Patient tolerated treatment well Patient left: in chair;with call bell/phone within reach;with family/visitor present;with chair alarm set  OT Visit Diagnosis: Unsteadiness on feet (R26.81);Other abnormalities of gait and mobility (R26.89);Muscle weakness (generalized) (M62.81);History of falling (Z91.81)                Time: [id number] OT Time Calculation (min): 18 min Charges:  OT General Charges $OT Visit: 1 Visit OT Evaluation $OT Eval Moderate Complexity: 1 Mod  Kari Baars, OT Acute Rehabilitation Services [phone] Office- [phone], Edwena Felty D 03/28/2019, 2:29 PM

## 2019-03-28 NOTE — Progress Notes (Signed)
PHARMACIST - PHYSICIAN COMMUNICATION DR:   [place] CONCERNING: Antibiotic IV to Oral Route Change Policy  RECOMMENDATION: This patient is receiving Azithromycin by the intravenous route.  Based on criteria approved by the Pharmacy and Therapeutics Committee, the antibiotic(s) is/are being converted to the equivalent oral dose form(s).   DESCRIPTION: These criteria include:  Patient being treated for a respiratory tract infection, urinary tract infection, cellulitis or clostridium difficile associated diarrhea if on metronidazole  The patient is not neutropenic and does not exhibit a GI malabsorption state  The patient is eating (either orally or via tube) and/or has been taking other orally administered medications for a least 24 hours  The patient is improving clinically and has a Tmax < 100.5  If you have questions about this conversion, please contact the Pharmacy Department  []   [phone] )  [facility] []   [phone] )  [facility] []   [phone] )  [facility] []   [phone] )  [facility] [x]   [phone] )  [place], PharmD, BCPS 03/28/2019 11:46 AM

## 2019-03-28 NOTE — Progress Notes (Signed)
PROGRESS NOTE    Pam [name]  [id number] DOB: August 20, 1925 DOA: 03/26/2019 PCP: [name], MD    Brief Narrative:   Pam [name] is a 85 y.o. 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  History obtained from the patient her niece and the ER records.  Patient denied any fever chills or cough.  Denies shortness of breath or dyspnea on exertion.  She ambulates inside the house sometimes with a walker sometimes without a walker.  Denies nausea vomiting or diarrhea. As far as she knows she has not had any sick contacts. Her niece Mardene Celeste is her healthcare power of attorney and she said the patient is a DNR. She was on anticoagulation in the past but this was stopped by her primary care physician because of melena. She was started on torsemide and hydrochlorothiazide in April 2019 for bilateral lower extremity swelling. She has decreased appetite and unintentional weight loss not sure how much she has lost.  ED Course: Patient received Rocephin. Sodium 138 potassium 4.6 BUN 87 creatinine 3.56 which is up from 2.3 in April 2019.  Hemoglobin 10.3 platelet count 210 white count 6.6 TSH 3.4. Covid is pending. UA consistent with UTI Chest x-ray shows stable cardiomegaly without pulmonary edema.  Acute bronchitis and/or bronchopneumonia is suspected involving the upper lobes bilaterally right greater than left.  Assessment & Plan:   Principal Problem:   Community acquired pneumonia Active Problems:   CKD (chronic kidney disease), stage IV ([place])   AKI (acute kidney injury) ([place])   Weakness   Acute lower UTI   Acute on chronic kidney disease stage IV/V Creatinine on admission elevated to 3.56, last 2.37 on 09/11/2017 with a GFR of 20.  Etiology likely  secondary to dehydration from poor oral intake coupled with pneumonia/UTI. --Cr 3.56-->2.94-->2.58 --Hold home HCTZ/torsemide --Strict I's and O's and daily weights --Follow renal function daily  Acute cystitis without hematuria Urinalysis with moderate leukoesterase, negative nitrite, many bacteria, greater than 50 WBC. --Urine culture: With multiple species present --Continue ceftriaxone  Community-acquired pneumonia Chest x-ray with cardiomegaly with pulmonary edema and acute bronchitis versus bronchopneumonia to bilateral upper lobes; right greater than left. --Continue azithromycin and ceftriaxone  Weakness/deconditioning Patient currently lives alone, with decreased mobility and weakness. --PT/OT recommends home health, 3 and 1 bedside commode --Niece is apparently arranging 24/7 hour care when she returns home --Home health orders and DME order placed  Essential hypertension --Continue metoprolol tartrate 25 mg p.o. twice daily --hold home torsemide and HCTZ for AKI as above   DVT prophylaxis: Heparin Code Status: DNR Family Communication: none Disposition Plan: Continue inpatient, IV antibiotics, anticipate discharge home with home health services in 1-2 days  Consultants:   None  Procedures:   None  Antimicrobials:  Azithromycin 10/26>>  Ceftriaxone 10/26>>   Subjective: Patient seen and examined at bedside, resting comfortably.  No acute complaints this morning.  Continues with generalized fatigue and weakness. Mild nonproductive cough with associated dyspnea.  Denies headache, no fever/chills/night sweats, no nausea/vomiting/diarrhea, no chest pain, no abdominal pain, no congestion, no paresthesias.  No acute events overnight per nursing staff.  Objective: Vitals:   03/27/19 0632 03/27/19 1343 03/27/19 2048 03/28/19 0608  BP: (!) 166/75 (!) 148/61 (!) 150/62 (!) 167/76  Pulse: (!) 55 (!) 52 61 66  Resp: 17  20 16 16   Temp: 97.9 F (36.6 C) (!) 97.5 F  (36.4 C) 98 F (36.7 C) 97.7 F (36.5 C)  TempSrc: Oral Oral Oral Oral  SpO2: 94% 99% 100% 98%  Weight:      Height:        Intake/Output Summary (Last 24 hours) at 03/28/2019 1502 Last data filed at 03/28/2019 1249 Gross per 24 hour  Intake 1753.19 ml  Output 450 ml  Net 1303.19 ml   Filed Weights   03/26/19 1206  Weight: 45.4 kg    Examination:  General exam: Appears calm and comfortable, thin in appearance Respiratory system: Breath sounds slightly decreased bilateral bases with mild crackles, no wheezing, normal respiratory effort on room air Cardiovascular system: S1 & S2 heard, RRR. No JVD, murmurs, rubs, gallops or clicks. No pedal edema. Gastrointestinal system: Abdomen is nondistended, soft and nontender. No organomegaly or masses felt. Normal bowel sounds heard. Central nervous system: Alert and oriented. No focal neurological deficits. Extremities: Symmetric 5 x 5 power. Skin: No rashes, lesions or ulcers Psychiatry: Judgement and insight appear poor. Mood & affect appropriate.     Data Reviewed: I have personally reviewed following labs and imaging studies  CBC: Recent Labs  Lab 03/26/19 1225 03/27/19 0608 03/28/19 0545  WBC 6.6 6.1 7.3  HGB 10.3* 9.3* 10.0*  HCT 31.5* 28.4* 29.4*  MCV 83.8 83.0 81.2  PLT 210 176 [id number]   Basic Metabolic Panel: Recent Labs  Lab 03/26/19 1225 03/27/19 0608 03/28/19 0545  NA 138 139 140  K 4.6 4.1 4.2  CL 101 105 107  CO2 25 24 22   GLUCOSE 98 82 78  BUN 87* 77* 61*  CREATININE 3.56* 2.94* 2.58*  CALCIUM 11.7* 10.5* 10.4*  MG  --   --  1.8   GFR: Estimated Creatinine Clearance: 10.2 mL/min (A) (by C-G formula based on SCr of 2.58 mg/dL (H)). Liver Function Tests: Recent Labs  Lab 03/26/19 1225 03/27/19 0608  AST 17 16  ALT 9 11  ALKPHOS 39 31*  BILITOT 1.0 0.9  PROT 7.6 6.6  ALBUMIN 3.8 3.4*   No results for input(s): LIPASE, AMYLASE in the last 168 hours. No results for input(s): AMMONIA in the  last 168 hours. Coagulation Profile: No results for input(s): INR, PROTIME in the last 168 hours. Cardiac Enzymes: No results for input(s): CKTOTAL, CKMB, CKMBINDEX, TROPONINI in the last 168 hours. BNP (last 3 results) No results for input(s): PROBNP in the last 8760 hours. HbA1C: No results for input(s): HGBA1C in the last 72 hours. CBG: No results for input(s): GLUCAP in the last 168 hours. Lipid Profile: No results for input(s): CHOL, HDL, LDLCALC, TRIG, CHOLHDL, LDLDIRECT in the last 72 hours. Thyroid Function Tests: Recent Labs    03/26/19 1225  TSH 3.458   Anemia Panel: No results for input(s): VITAMINB12, FOLATE, FERRITIN, TIBC, IRON, RETICCTPCT in the last 72 hours. Sepsis Labs: No results for input(s): PROCALCITON, LATICACIDVEN in the last 168 hours.  Recent Results (from the past 240 hour(s))  Urine culture     Status: Abnormal   Collection Time: 03/26/19  2:00 PM   Specimen: Urine, Clean Catch  Result Value Ref Range Status   Specimen Description   Final    URINE, CLEAN CATCH Performed at [facility], [place] [address] [place], [place] [address]    Special Requests   Final    NONE Performed at [facility], [place] [address], [place], [place] [address]  Culture MULTIPLE SPECIES PRESENT, SUGGEST RECOLLECTION (A)  Final   Report Status 03/28/2019 FINAL  Final  SARS CORONAVIRUS 2 (TAT 6-24 HRS) Nasopharyngeal Nasopharyngeal Swab     Status: None   Collection Time: 03/26/19  2:50 PM   Specimen: Nasopharyngeal Swab  Result Value Ref Range Status   SARS Coronavirus 2 NEGATIVE NEGATIVE Final    Comment: (NOTE) SARS-CoV-2 target nucleic acids are NOT DETECTED. The SARS-CoV-2 RNA is generally detectable in upper and lower respiratory specimens during the acute phase of infection. Negative results do not preclude SARS-CoV-2 infection, do not rule out co-infections with other pathogens, and should not be used as the sole basis for  treatment or other patient management decisions. Negative results must be combined with clinical observations, patient history, and epidemiological information. The expected result is Negative. Fact Sheet for Patients: SugarRoll.be Fact Sheet for Healthcare Providers: https://www.woods-mathews.com/ This test is not yet approved or cleared by the [place] FDA and  has been authorized for detection and/or diagnosis of SARS-CoV-2 by FDA under an Emergency Use Authorization (EUA). This EUA will remain  in effect (meaning this test can be used) for the duration of the COVID-19 declaration under Section 56 4(b)(1) of the Act, 21 U.S.C. section 360bbb-3(b)(1), unless the authorization is terminated or revoked sooner. Performed at [facility], [place] [address], [place], [place] [address]          Radiology Studies: No results found.      Scheduled Meds: . azithromycin  500 mg Oral QHS  . ferrous gluconate   Oral Daily  . heparin  5,000 Units Subcutaneous Q8H  . metoprolol tartrate  25 mg Oral BID  . sodium chloride flush  3 mL Intravenous Once   Continuous Infusions: . sodium chloride 75 mL/hr at 03/28/19 1249  . cefTRIAXone (ROCEPHIN)  IV Stopped (03/27/19 1531)     LOS: 2 days    Time spent: 34 minutes spent on chart review, discussion with nursing staff, consultants, updating family and interview/physical exam; more than 50% of that time was spent in counseling and/or coordination of care.    Damire Remedios J [place] DO [facility] 03/28/2019, 3:02 PM

## 2019-03-29 DIAGNOSIS — N179 Acute kidney failure, unspecified: Secondary | ICD-10-CM | POA: Diagnosis not present

## 2019-03-29 DIAGNOSIS — N390 Urinary tract infection, site not specified: Secondary | ICD-10-CM | POA: Diagnosis not present

## 2019-03-29 DIAGNOSIS — N184 Chronic kidney disease, stage 4 (severe): Secondary | ICD-10-CM | POA: Diagnosis not present

## 2019-03-29 DIAGNOSIS — J189 Pneumonia, unspecified organism: Secondary | ICD-10-CM | POA: Diagnosis not present

## 2019-03-29 LAB — BASIC METABOLIC PANEL
Anion gap: 8 (ref 5–15)
BUN: 54 mg/dL — ABNORMAL HIGH (ref 8–23)
CO2: 18 mmol/L — ABNORMAL LOW (ref 22–32)
Calcium: 10.1 mg/dL (ref 8.9–10.3)
Chloride: 112 mmol/L — ABNORMAL HIGH (ref 98–111)
Creatinine, Ser: 2.41 mg/dL — ABNORMAL HIGH (ref 0.44–1.00)
GFR calc Af Amer: 20 mL/min — ABNORMAL LOW (ref >60)
GFR calc non Af Amer: 17 mL/min — ABNORMAL LOW (ref >60)
Glucose, Bld: 91 mg/dL (ref 70–99)
Potassium: 3.8 mmol/L (ref 3.5–5.1)
Sodium: 138 mmol/L (ref 135–145)

## 2019-03-29 MED ORDER — ACETAMINOPHEN 325 MG PO TABS: 650.0000 mg | ORAL_TABLET | Freq: Four times a day (QID) | ORAL | Status: DC | PRN

## 2019-03-29 MED ORDER — TRAMADOL HCL 50 MG PO TABS
50.0000 mg | ORAL_TABLET | Freq: Two times a day (BID) | ORAL | Status: DC | PRN
  Filled 2019-03-29: qty 1

## 2019-03-29 MED ORDER — AZITHROMYCIN 250 MG PO TABS: 250.0000 mg | ORAL_TABLET | Freq: Every day | ORAL | 0 refills | Status: AC

## 2019-03-29 MED ORDER — CEFDINIR 300 MG PO CAPS: 300.0000 mg | ORAL_CAPSULE | Freq: Two times a day (BID) | ORAL | 0 refills | Status: AC

## 2019-03-29 NOTE — Discharge Summary (Signed)
Physician Discharge Summary  KUM BARNWELL [id number] DOB: 25-Feb-1926 DOA: 03/26/2019  PCP: [name], MD  Admit date: 03/26/2019 Discharge date: 03/29/2019  Admitted From: Home Disposition:  Home  Recommendations for Outpatient Follow-up:  1. Follow up with PCP in 1-2 weeks 2. Please obtain BMP in one week 3. Continue antibiotics with azithromycin and cefdinir for pneumonia versus UTI 4. Discontinue torsemide for acute on chronic renal failure secondary to dehydration 5. Follow blood pressure closely, may need to consider alternatives to HCTZ for blood pressure control given her dehydration and AKI.  Home Health: Yes, PT/OT/RN/aide Equipment/Devices: 3:1 bedside commode  Discharge Condition: Stable CODE STATUS: DNR Diet recommendation: Heart Healthy  History of present illness:  Pam [name] a 85 y.o.femalewith medical history significant ofCKD stagestage IV/V not on hemodialysis, atrial fibrillationnot on anticoagulation, diverticulosis, peripheral artery disease, hypertension hypercholesterolemia spinal stenosis lives at home alone was brought in by her niece for generalized weakness. History obtained from the patient her niece and the ER records. Patient denied any fever chills or cough. Denies shortness of breath or dyspnea on exertion. She ambulates inside the house sometimes with a walker sometimes without a walker. Denies nausea vomiting or diarrhea. As far as she knows she has not had any sick contacts. Her niece Mardene Celeste is her healthcare power of attorney and she said the patient is a DNR. She was on anticoagulation in the past but this was stopped by her primary care physician because of melena. She was started on torsemide and hydrochlorothiazide in April 2019 for bilateral lower extremity swelling. She has decreased appetite and unintentional weight loss not sure how much she has lost.  ED Course: Patient received Rocephin. Sodium 138 potassium 4.6 BUN 87  creatinine 3.56 which is up from 2.3 in April 2019. Hemoglobin 10.3 platelet count 210 white count 6.6 TSH 3.4.Covid is pending. UA consistent with UTI Chest x-ray shows stable cardiomegaly without pulmonary edema. Acute bronchitis and/or bronchopneumonia is suspected involving the upper lobes bilaterally right greater than left.  Hospital course:  Acute on chronic kidney disease stage IV/V Creatinine on admission elevated to 3.56, last 2.37 on 09/11/2017 with a GFR of 20.  Etiology likely secondary to dehydration from poor oral intake coupled with pneumonia/UTI.  Patient's home HCTZ and torsemide were held.  Patient was supported with IV antibiotics as well as IV fluids.  Patient's creatinine improved to 2.41 at time of discharge.  Will discontinue patient's home torsemide.  May resume HCTZ.  Recommend follow-up with PCP in 1 week with repeat BMP at that visit.  May need to consider alternatives to HCTZ for her underlying hypertension if creatinine trends back up.  Acute cystitis without hematuria Urinalysis with moderate leukoesterase, negative nitrite, many bacteria, greater than 50 WBC.  Patient was started on IV ceftriaxone.  Urine culture: With multiple species present.  We will continue antibiotics with cefdinir outpatient.  Community-acquired pneumonia Chest x-ray with cardiomegaly with pulmonary edema and acute bronchitis versus bronchopneumonia to bilateral upper lobes; right greater than left.  Patient was started on azithromycin and ceftriaxone.  Patient will continue azithromycin to complete a 5-day course and cefdinir to complete a 10-day course following discharge.  Weakness/deconditioning Patient currently lives alone, with decreased mobility and weakness.  Patient was evaluated by physical therapy and Occupational Therapy while inpatient.  With recommendations of home health and 3 in 1 bedside commode.  Patient's niece is arranging 24/7 care when she returns home.  Essential  hypertension Continue metoprolol tartrate 25 mg  p.o. twice daily and HCTZ.  Discontinue torsemide secondary to significant dehydration with AKI on admission.  If creatinine continues to trend up on repeat BMP at next PCP visit, may need to consider alternative antihypertensives rather than HCTZ.  Discharge Diagnoses:  Principal Problem:   Community acquired pneumonia Active Problems:   CKD (chronic kidney disease), stage IV (HCC)   AKI (acute kidney injury) ([place])   Weakness   Acute lower UTI    Discharge Instructions  Discharge Instructions    Call MD for:  difficulty breathing, headache or visual disturbances   Complete by: As directed    Call MD for:  extreme fatigue   Complete by: As directed    Call MD for:  persistant dizziness or light-headedness   Complete by: As directed    Call MD for:  persistant nausea and vomiting   Complete by: As directed    Call MD for:  severe uncontrolled pain   Complete by: As directed    Call MD for:  temperature >100.4   Complete by: As directed    Diet - low sodium heart healthy   Complete by: As directed    Increase activity slowly   Complete by: As directed      Allergies as of 03/29/2019   No Known Allergies     Medication List    STOP taking these medications   torsemide 10 MG tablet Commonly known as: DEMADEX     TAKE these medications   azithromycin 250 MG tablet Commonly known as: Zithromax Take 1 tablet (250 mg total) by mouth daily for 3 days.   cefdinir 300 MG capsule Commonly known as: OMNICEF Take 1 capsule (300 mg total) by mouth 2 (two) times daily for 8 days.   hydrochlorothiazide 12.5 MG capsule Commonly known as: MICROZIDE Take 1 capsule (12.5 mg total) by mouth daily.   IRON PO Take 1 tablet by mouth daily.   metoprolol tartrate 25 MG tablet Commonly known as: LOPRESSOR Take 25 mg by mouth 2 (two) times daily.   polyethylene glycol 17 g packet Commonly known as: MIRALAX / GLYCOLAX Take 17 g by  mouth daily. What changed:   when to take this  reasons to take this            Durable Medical Equipment  (From admission, onward)         Start     Ordered   03/28/19 1503  For home use only DME 3 n 1  Once     03/28/19 1502         Follow-up Information    [name], MD. Schedule an appointment as soon as possible for a visit in 1 week(s).   Specialty: Internal Medicine Contact information: [phone] [address] [phone]          No Known Allergies  Consultations:  none   Procedures/Studies: Dg Chest Port 1 View  Result Date: 03/26/2019 CLINICAL DATA:  85 year old with fatigue and anorexia. Generalized weakness. EXAM: PORTABLE CHEST 1 VIEW COMPARISON:  02/10/2008. FINDINGS: Cardiac silhouette moderately enlarged, unchanged. Thoracic aorta atherosclerotic, more so than previously. Hilar and mediastinal contours otherwise unremarkable. Prominent bronchovascular markings focally in the UPPER lobes bilaterally, RIGHT greater than LEFT. Lungs otherwise clear. Pulmonary vascularity normal. No pleural effusions. IMPRESSION: 1. Stable cardiomegaly without pulmonary edema. 2. Acute bronchitis and/or bronchopneumonia is suspected involving the UPPER lobes bilaterally, RIGHT greater than LEFT. 3.  Aortic Atherosclerosis (ICD10-170.0) Electronically Signed   By: Marcello Moores  Lawrence M.D.   On: 03/26/2019 13:57      Subjective: Patient seen and examined at bedside, resting comfortably.  Requesting to go home today.  No complaints this morning.  Denies headache, no fever/chills/night sweats, no nausea/vomiting/diarrhea, no chest pain, no palpitations, no shortness of breath, no cough/congestion, no abdominal pain.  No acute events overnight per nursing staff.   Discharge Exam: Vitals:   03/28/19 2032 03/29/19 0650  BP: (!) 154/78 (!) 164/88  Pulse: 66 65  Resp: 15 15  Temp:  97.7 F (36.5 C)  SpO2: 99% 94%   Vitals:   03/28/19 0608 03/28/19  1602 03/28/19 2032 03/29/19 0650  BP: (!) 167/76 (!) 145/90 (!) 154/78 (!) 164/88  Pulse: 66 72 66 65  Resp: 16 15 15 15   Temp: 97.7 F (36.5 C) 98.2 F (36.8 C)  97.7 F (36.5 C)  TempSrc: Oral Oral  Oral  SpO2: 98% 93% 99% 94%  Weight:      Height:        General exam: Appears calm and comfortable, thin in appearance Respiratory system: Breath sounds slightly decreased bilateral bases with mild crackles, no wheezing, normal respiratory effort on room air Cardiovascular system: S1 & S2 heard, RRR. No JVD, murmurs, rubs, gallops or clicks. No pedal edema. Gastrointestinal system: Abdomen is nondistended, soft and nontender. No organomegaly or masses felt. Normal bowel sounds heard. Central nervous system: Alert and oriented. No focal neurological deficits. Extremities: Symmetric 5 x 5 power. Skin: No rashes, lesions or ulcers Psychiatry: Judgement and insight appear poor. Mood & affect appropriate.     The results of significant diagnostics from this hospitalization (including imaging, microbiology, ancillary and laboratory) are listed below for reference.     Microbiology: Recent Results (from the past 240 hour(s))  Urine culture     Status: Abnormal   Collection Time: 03/26/19  2:00 PM   Specimen: Urine, Clean Catch  Result Value Ref Range Status   Specimen Description   Final    URINE, CLEAN CATCH Performed at [facility], [place] [address], [place], [place] [address]    Special Requests   Final    NONE Performed at [facility], [place] [address], [place], [place] [address]    Culture MULTIPLE SPECIES PRESENT, SUGGEST RECOLLECTION (A)  Final   Report Status 03/28/2019 FINAL  Final  SARS CORONAVIRUS 2 (TAT 6-24 HRS) Nasopharyngeal Nasopharyngeal Swab     Status: None   Collection Time: 03/26/19  2:50 PM   Specimen: Nasopharyngeal Swab  Result Value Ref Range Status   SARS Coronavirus 2 NEGATIVE NEGATIVE Final    Comment:  (NOTE) SARS-CoV-2 target nucleic acids are NOT DETECTED. The SARS-CoV-2 RNA is generally detectable in upper and lower respiratory specimens during the acute phase of infection. Negative results do not preclude SARS-CoV-2 infection, do not rule out co-infections with other pathogens, and should not be used as the sole basis for treatment or other patient management decisions. Negative results must be combined with clinical observations, patient history, and epidemiological information. The expected result is Negative. Fact Sheet for Patients: SugarRoll.be Fact Sheet for Healthcare Providers: https://www.woods-mathews.com/ This test is not yet approved or cleared by the [place] FDA and  has been authorized for detection and/or diagnosis of SARS-CoV-2 by FDA under an Emergency Use Authorization (EUA). This EUA will remain  in effect (meaning this test can be used) for the duration of the COVID-19 declaration under Section 56 4(b)(1) of the Act, 21 U.S.C.  section 360bbb-3(b)(1), unless the authorization is terminated or revoked sooner. Performed at [facility], [place] [address] [place], [place] [address]      Labs: BNP (last 3 results) No results for input(s): BNP in the last 8760 hours. Basic Metabolic Panel: Recent Labs  Lab 03/26/19 1225 03/27/19 0608 03/28/19 0545 03/29/19 0553  NA 138 139 140 138  K 4.6 4.1 4.2 3.8  CL 101 105 107 112*  CO2 25 24 22  18*  GLUCOSE 98 82 78 91  BUN 87* 77* 61* 54*  CREATININE 3.56* 2.94* 2.58* 2.41*  CALCIUM 11.7* 10.5* 10.4* 10.1  MG  --   --  1.8  --    Liver Function Tests: Recent Labs  Lab 03/26/19 1225 03/27/19 0608  AST 17 16  ALT 9 11  ALKPHOS 39 31*  BILITOT 1.0 0.9  PROT 7.6 6.6  ALBUMIN 3.8 3.4*   No results for input(s): LIPASE, AMYLASE in the last 168 hours. No results for input(s): AMMONIA in the last 168 hours. CBC: Recent Labs  Lab 03/26/19 1225  03/27/19 0608 03/28/19 0545  WBC 6.6 6.1 7.3  HGB 10.3* 9.3* 10.0*  HCT 31.5* 28.4* 29.4*  MCV 83.8 83.0 81.2  PLT 210 176 171   Cardiac Enzymes: No results for input(s): CKTOTAL, CKMB, CKMBINDEX, TROPONINI in the last 168 hours. BNP: Invalid input(s): POCBNP CBG: No results for input(s): GLUCAP in the last 168 hours. D-Dimer No results for input(s): DDIMER in the last 72 hours. Hgb A1c No results for input(s): HGBA1C in the last 72 hours. Lipid Profile No results for input(s): CHOL, HDL, LDLCALC, TRIG, CHOLHDL, LDLDIRECT in the last 72 hours. Thyroid function studies Recent Labs    03/26/19 1225  TSH 3.458   Anemia work up No results for input(s): VITAMINB12, FOLATE, FERRITIN, TIBC, IRON, RETICCTPCT in the last 72 hours. Urinalysis    Component Value Date/Time   COLORURINE STRAW (A) 03/26/2019 1400   APPEARANCEUR CLEAR 03/26/2019 1400   LABSPEC 1.006 03/26/2019 1400   LABSPEC 1.015 11/28/2008 1133   PHURINE 6.0 03/26/2019 1400   GLUCOSEU NEGATIVE 03/26/2019 1400   HGBUR SMALL (A) 03/26/2019 1400   BILIRUBINUR NEGATIVE 03/26/2019 1400   BILIRUBINUR Negative 11/28/2008 1133   KETONESUR NEGATIVE 03/26/2019 1400   PROTEINUR NEGATIVE 03/26/2019 1400   UROBILINOGEN 0.2 10/30/2011 1653   NITRITE NEGATIVE 03/26/2019 1400   LEUKOCYTESUR MODERATE (A) 03/26/2019 1400   LEUKOCYTESUR Negative 11/28/2008 1133   Sepsis Labs Invalid input(s): PROCALCITONIN,  WBC,  LACTICIDVEN Microbiology Recent Results (from the past 240 hour(s))  Urine culture     Status: Abnormal   Collection Time: 03/26/19  2:00 PM   Specimen: Urine, Clean Catch  Result Value Ref Range Status   Specimen Description   Final    URINE, CLEAN CATCH Performed at [facility], [place] [address] [place], [place] [address]    Special Requests   Final    NONE Performed at [facility], [place] [address], [place], [place] [address]    Culture MULTIPLE SPECIES PRESENT,  SUGGEST RECOLLECTION (A)  Final   Report Status 03/28/2019 FINAL  Final  SARS CORONAVIRUS 2 (TAT 6-24 HRS) Nasopharyngeal Nasopharyngeal Swab     Status: None   Collection Time: 03/26/19  2:50 PM   Specimen: Nasopharyngeal Swab  Result Value Ref Range Status   SARS Coronavirus 2 NEGATIVE NEGATIVE Final    Comment: (NOTE) SARS-CoV-2 target nucleic acids are NOT DETECTED. The SARS-CoV-2 RNA is generally  detectable in upper and lower respiratory specimens during the acute phase of infection. Negative results do not preclude SARS-CoV-2 infection, do not rule out co-infections with other pathogens, and should not be used as the sole basis for treatment or other patient management decisions. Negative results must be combined with clinical observations, patient history, and epidemiological information. The expected result is Negative. Fact Sheet for Patients: SugarRoll.be Fact Sheet for Healthcare Providers: https://www.woods-mathews.com/ This test is not yet approved or cleared by the [place] FDA and  has been authorized for detection and/or diagnosis of SARS-CoV-2 by FDA under an Emergency Use Authorization (EUA). This EUA will remain  in effect (meaning this test can be used) for the duration of the COVID-19 declaration under Section 56 4(b)(1) of the Act, 21 U.S.C. section 360bbb-3(b)(1), unless the authorization is terminated or revoked sooner. Performed at [facility], [place] [address] [place], [place] [address]      Time coordinating discharge: Over 30 minutes  SIGNED:   Eric J [place] DO  [facility] 03/29/2019, 10:40 AM

## 2019-03-29 NOTE — TOC Initial Note (Signed)
Transition of Care [facility]) - Initial/Assessment Note    Patient Details  Name: Pam [name] MRN: [id number] Date of Birth: [birth year]  [facility] CM/SW Contact:    Drew Lips, Marjie Skiff, RN Phone Number:[phone] 03/29/2019, 1:28 PM  Clinical Narrative:                 Pt to dc back home with [facility] services. This CM contacted pt niece who states that her and the rest of the family will provide 24hr care for pt when she comes home. Contact info for home health agency placed on AVS.  Expected Discharge Plan: [place] Barriers to Discharge: No Barriers Identified   Patient Goals and CMS Choice Patient states their goals for this hospitalization and ongoing recovery are:: To get home CMS Medicare.gov Compare Post Acute Care list provided to:: Patient Choice offered to / list presented to : Patient  Expected Discharge Plan and Services Expected Discharge Plan: [place]   Discharge Planning Services: CM Consult Post Acute Care Choice: [place] arrangements for the past 2 months: Single Family Home Expected Discharge Date: 03/29/19                         HH Arranged: PT, OT, Nurse's Aide HH Agency: [place] Date [facility] Agency Contacted: 03/29/19 Time [place] Agency Contacted: 67 Representative spoke with at [place]: Referral Center  Prior Living Arrangements/Services Living arrangements for the past 2 months: [place] with:: Self   Do you feel safe going back to the place where you live?: Yes      Need for Family Participation in Patient Care: Yes (Comment) Care giver support system in place?: Yes (comment)      Activities of Daily Living Home Assistive Devices/Equipment: Eyeglasses, Dentures (specify type), Walker (specify type)(4 wheeled walker, upper/lower dentures) ADL Screening (condition at time of admission) Patient's cognitive ability adequate to safely complete daily  activities?: Yes Is the patient deaf or have difficulty hearing?: No Does the patient have difficulty seeing, even when wearing glasses/contacts?: No Does the patient have difficulty concentrating, remembering, or making decisions?: No Patient able to express need for assistance with ADLs?: Yes Does the patient have difficulty dressing or bathing?: No(patient reports that she does ok with her adls) Independently performs ADLs?: Yes (appropriate for developmental age) Does the patient have difficulty walking or climbing stairs?: Yes(secondary to arthritis) Weakness of Legs: Both Weakness of Arms/Hands: None  Permission Sought/Granted Permission sought to share information with : Facility Art therapist granted to share information with : Yes, Verbal Permission Granted     Permission granted to share info w AGENCY: [place]        Emotional Assessment Appearance:: Appears stated age Attitude/Demeanor/Rapport: Sedated Affect (typically observed): Adaptable Orientation: : Oriented to Self, Oriented to Place, Oriented to  Time, Oriented to Situation   Psych Involvement: No (comment)  Admission diagnosis:  Weakness [R53.1] Acute cystitis without hematuria [N30.00] AKI (acute kidney injury) ([place]) [N17.9] Patient Active Problem List   Diagnosis Date Noted  . Community acquired pneumonia 03/27/2019  . Acute lower UTI 03/27/2019  . AKI (acute kidney injury) ([place]) 03/26/2019  . Acute cystitis without hematuria   . Weakness   . Constipation 11/21/2016  . Uremia 11/20/2016  . Impacted stool in rectum ([place]) 11/20/2016  . CKD (chronic kidney disease), stage IV ([place]) 11/20/2016  . PAD (peripheral artery disease) (  [place]) 07/01/2016  . Abdominal aortic aneurysm without rupture ([place]) 04/18/2014  . PVD (peripheral vascular disease) ([place]) 03/20/2014  . Bilateral lower extremity edema 01/15/2013  . ARTHRITIS, KNEE 05/16/2009  . VULVAR ABSCESS 05/02/2009  . Normocytic anemia  08/09/2008  . FECAL OCCULT BLOOD 08/09/2008  . VITAMIN D DEFICIENCY 08/08/2008  . HYPERLIPIDEMIA 08/08/2008  . Essential hypertension 08/08/2008  . FIBRILLATION, ATRIAL 08/08/2008  . CAROTID ARTERY DISEASE 08/08/2008  . GERD 08/08/2008  . HIATAL HERNIA 08/08/2008  . DIVERTICULOSIS, COLON 08/08/2008  . GASTROINTESTINAL HEMORRHAGE 08/08/2008  . RENAL DISEASE, CHRONIC 08/08/2008  . SPINAL STENOSIS 08/08/2008  . OSTEOPOROSIS 08/08/2008  . HYPERPARATHYROIDISM, HX OF 08/08/2008  . COLONIC POLYPS, HYPERPLASTIC, HX OF 08/08/2008  . RENAL CALCULUS, HX OF 08/08/2008   PCP:  [name], MD Pharmacy:   [place], [place] [id number] [place] [id number] Phone: [phone] Fax: [phone]  [id number] - [place], [place] - [address] [place] [address] Phone: [phone] Fax: [phone]  [id number] - [place], [place] - [address] [place] [address] Phone: [phone] Fax: [phone]     Social Determinants of Health (SDOH) Interventions    Readmission Risk Interventions No flowsheet data found.

## 2019-03-29 NOTE — Care Management Important Message (Signed)
Important Message  Patient Details IM Letter given to Marney Doctor RN to present to the Patient Name: Pam [name] MRN: [id number] Date of Birth: 1925-06-18   Medicare Important Message Given:  Yes     Kerin Salen 03/29/2019, 10:37 AM

## 2019-03-29 NOTE — Progress Notes (Signed)
Patient discharged to home with family, discharge instructions reviewed with niece Fraser Din, who verbalized understanding.

## 2019-04-09 ENCOUNTER — Other Ambulatory Visit: Payer: Self-pay

## 2019-04-09 ENCOUNTER — Emergency Department (HOSPITAL_COMMUNITY): Payer: Medicare Other

## 2019-04-09 ENCOUNTER — Emergency Department (HOSPITAL_COMMUNITY)
Admission: EM | Admit: 2019-04-09 | Discharge: 2019-04-10 | Disposition: A | Discharge status: Home / Self Care | Payer: Medicare Other | Attending: Emergency Medicine | Admitting: Emergency Medicine

## 2019-04-09 DIAGNOSIS — R5383 Other fatigue: Secondary | ICD-10-CM

## 2019-04-09 DIAGNOSIS — N19 Unspecified kidney failure: Secondary | ICD-10-CM

## 2019-04-09 DIAGNOSIS — I214 Non-ST elevation (NSTEMI) myocardial infarction: Secondary | ICD-10-CM | POA: Diagnosis not present

## 2019-04-09 DIAGNOSIS — Z87891 Personal history of nicotine dependence: Secondary | ICD-10-CM | POA: Insufficient documentation

## 2019-04-09 DIAGNOSIS — I2510 Atherosclerotic heart disease of native coronary artery without angina pectoris: Secondary | ICD-10-CM | POA: Insufficient documentation

## 2019-04-09 DIAGNOSIS — I130 Hypertensive heart and chronic kidney disease with heart failure and stage 1 through stage 4 chronic kidney disease, or unspecified chronic kidney disease: Secondary | ICD-10-CM | POA: Diagnosis not present

## 2019-04-09 DIAGNOSIS — R531 Weakness: Secondary | ICD-10-CM

## 2019-04-09 DIAGNOSIS — N184 Chronic kidney disease, stage 4 (severe): Secondary | ICD-10-CM | POA: Diagnosis not present

## 2019-04-09 DIAGNOSIS — R4182 Altered mental status, unspecified: Secondary | ICD-10-CM | POA: Insufficient documentation

## 2019-04-09 DIAGNOSIS — Z79899 Other long term (current) drug therapy: Secondary | ICD-10-CM | POA: Insufficient documentation

## 2019-04-09 DIAGNOSIS — Z66 Do not resuscitate: Secondary | ICD-10-CM

## 2019-04-09 DIAGNOSIS — R627 Adult failure to thrive: Secondary | ICD-10-CM | POA: Diagnosis not present

## 2019-04-09 DIAGNOSIS — R0602 Shortness of breath: Secondary | ICD-10-CM | POA: Diagnosis present

## 2019-04-09 DIAGNOSIS — I509 Heart failure, unspecified: Secondary | ICD-10-CM | POA: Insufficient documentation

## 2019-04-09 DIAGNOSIS — Z7189 Other specified counseling: Secondary | ICD-10-CM

## 2019-04-09 DIAGNOSIS — E213 Hyperparathyroidism, unspecified: Secondary | ICD-10-CM | POA: Diagnosis not present

## 2019-04-09 LAB — CBC WITH DIFFERENTIAL/PLATELET
Abs Immature Granulocytes: 0.08 10*3/uL — ABNORMAL HIGH (ref 0.00–0.07)
Basophils Absolute: 0 10*3/uL (ref 0.0–0.1)
Basophils Relative: 0 %
Eosinophils Absolute: 0 10*3/uL (ref 0.0–0.5)
Eosinophils Relative: 0 %
HCT: 26 % — ABNORMAL LOW (ref 36.0–46.0)
Hemoglobin: 9.1 g/dL — ABNORMAL LOW (ref 12.0–15.0)
Immature Granulocytes: 1 %
Lymphocytes Relative: 3 %
Lymphs Abs: 0.4 10*3/uL — ABNORMAL LOW (ref 0.7–4.0)
MCH: 27.3 pg (ref 26.0–34.0)
MCHC: 35 g/dL (ref 30.0–36.0)
MCV: 78.1 fL — ABNORMAL LOW (ref 80.0–100.0)
Monocytes Absolute: 0.8 10*3/uL (ref 0.1–1.0)
Monocytes Relative: 5 %
Neutro Abs: 13 10*3/uL — ABNORMAL HIGH (ref 1.7–7.7)
Neutrophils Relative %: 91 %
Platelets: 231 10*3/uL (ref 150–400)
RBC: 3.33 MIL/uL — ABNORMAL LOW (ref 3.87–5.11)
RDW: 13 % (ref 11.5–15.5)
WBC: 14.3 10*3/uL — ABNORMAL HIGH (ref 4.0–10.5)
nRBC: 0.1 % (ref 0.0–0.2)

## 2019-04-09 LAB — LACTIC ACID, PLASMA
Lactic Acid, Venous: 1.6 mmol/L (ref 0.5–1.9)
Lactic Acid, Venous: 1.8 mmol/L (ref 0.5–1.9)

## 2019-04-09 LAB — TROPONIN I (HIGH SENSITIVITY)
Troponin I (High Sensitivity): 268 ng/L (ref <18)
Troponin I (High Sensitivity): 206 ng/L (ref <18)

## 2019-04-09 LAB — COMPREHENSIVE METABOLIC PANEL
ALT: 13 U/L (ref 0–44)
AST: 23 U/L (ref 15–41)
Albumin: 2.3 g/dL — ABNORMAL LOW (ref 3.5–5.0)
Alkaline Phosphatase: 48 U/L (ref 38–126)
Anion gap: 17 — ABNORMAL HIGH (ref 5–15)
BUN: 71 mg/dL — ABNORMAL HIGH (ref 8–23)
CO2: 18 mmol/L — ABNORMAL LOW (ref 22–32)
Calcium: 10.1 mg/dL (ref 8.9–10.3)
Chloride: 99 mmol/L (ref 98–111)
Creatinine, Ser: 6.92 mg/dL — ABNORMAL HIGH (ref 0.44–1.00)
GFR calc Af Amer: 6 mL/min — ABNORMAL LOW (ref >60)
GFR calc non Af Amer: 5 mL/min — ABNORMAL LOW (ref >60)
Glucose, Bld: 99 mg/dL (ref 70–99)
Potassium: 4.1 mmol/L (ref 3.5–5.1)
Sodium: 134 mmol/L — ABNORMAL LOW (ref 135–145)
Total Bilirubin: 1.4 mg/dL — ABNORMAL HIGH (ref 0.3–1.2)
Total Protein: 6.1 g/dL — ABNORMAL LOW (ref 6.5–8.1)

## 2019-04-09 LAB — PROTIME-INR
INR: 1.3 — ABNORMAL HIGH (ref 0.8–1.2)
Prothrombin Time: 16.1 seconds — ABNORMAL HIGH (ref 11.4–15.2)

## 2019-04-09 LAB — BRAIN NATRIURETIC PEPTIDE: B Natriuretic Peptide: 3263.9 pg/mL — ABNORMAL HIGH (ref 0.0–100.0)

## 2019-04-09 MED ORDER — MORPHINE SULFATE (PF) 4 MG/ML IV SOLN: 4.0000 mg | INTRAVENOUS | Status: DC | PRN

## 2019-04-09 MED ORDER — ACETAMINOPHEN 325 MG PO TABS: 650.0000 mg | ORAL_TABLET | ORAL | Status: DC | PRN

## 2019-04-09 MED ORDER — SODIUM CHLORIDE 0.9 % IV SOLN
Freq: Once | INTRAVENOUS | Status: AC
  Administered 2019-04-09: 19:00:00 via INTRAVENOUS

## 2019-04-09 MED ORDER — ONDANSETRON HCL 4 MG/2ML IJ SOLN: 4.0000 mg | Freq: Four times a day (QID) | INTRAMUSCULAR | Status: DC | PRN

## 2019-04-09 NOTE — ED Notes (Signed)
Date and time results received: 04/09/19 1454 (use smartphrase ".now" to insert current time)  Test: troponin Critical Value: 268ng/L  Name of Provider Notified: ED PA  Orders Received? Or Actions Taken?: Orders Received - See Orders for details , will draw 2nd troponin when due

## 2019-04-09 NOTE — ED Triage Notes (Signed)
To ED via [facility] from home, with family stating that since she has been home from the hospital she has not gotten better- more short of breath, less alert.  Has a moist cough, not able to cough anything up.

## 2019-04-09 NOTE — Progress Notes (Addendum)
Palliative Note:  1347:Referral received for goals of care and assisting family set-up home hospice. Attempts made to reach out to niece/POA , Evangeline Dakin. She was unavailable and possibly left for self-care needs. Attempted to call.   1705: Attempted to reach out to niece again, at this time she is at the bedside with patient, however she is discussing plans with Mariann Laster, Case Manager. Introduced myself and Palliative. Mardene Celeste informed she would contact me back after speaking with Case Manager.   If plans are for patient to discharge home with outpatient hospice set-up orders may be placed and referral may be sent by Case Manager. If further Palliative needs please feel free to reach out for assistance.   I can be reached at [phone] (pager), via text page on [place], or by secured [facility] chat.   [phone] Paged received that family requesting to discuss hospice and options further. Will come meet with family for [place].   Alda Lea, AGPCNP-BC Palliative Medicine Team  Pager: [phone] Amion: N. [place]   NO CHARGE

## 2019-04-09 NOTE — ED Provider Notes (Signed)
[facility] EMERGENCY DEPARTMENT Provider Note   CSN: [id number] Arrival date & time: 04/09/19  1013     History   Chief Complaint Chief Complaint  Patient presents with  . Shortness of Breath  . Altered Mental Status    HPI ALLYSIA SIBBETT is a 85 y.o. female history of stage IV kidney disease, not on dialysis, high cholesterol, hypertension, coronary artery disease, A. fib not on anticoagulation, presenting to emergency department with shortness of breath and weakness.  The patient was just discharged from our hospital on March 29, 2019 after a 3-day course of treatment for pneumonia and possible UTI, treated with a course of azithromycin and cefdinir.  Was also noted to have acute on chronic renal failure which was felt to be secondary to dehydration at the time and her torsemide was discontinued.  On discharge her creatinine was 2.4 (improved from 3.56 on arrival).  She returns today with her family reporting continued concern of weakness and failure to thrive at home.  They state the patient has been short of breath and has had a weak cough since arriving home.  Her urine culture from 10/26 showing multiple species, mixed flora, likely a contaminant.  She has a history of permanent A. fib was stopped on her anticoagulation by her primary care doctor because of issues with melena.  She has had decreased appetite unintentional weight loss for some time now.  Patient is DNR  Update 1130 AM -I spoke to the patient's niece Mardene Celeste who is also her power of attorney and lives at home with her.  Patient states that the patient is continues to decline in her mental status and her food intake since discharge from the hospital.  She states she can barely get the patient to eat anything or drink anything.  The patient was refusing physical therapy.  She states that yesterday the patient an episode of dark looking vomit.  They called her primary care doctor's office who told them  that they may need evaluation in the ER.     HPI  Past Medical History:  Diagnosis Date  . Anemia   . Ankle edema   . Atrial fibrillation ([place])   . CAD (coronary artery disease)   . Carotid bruit    bilateral  . Chronic kidney disease   . Diverticulosis   . GERD (gastroesophageal reflux disease)   . History of blood transfusion    "probably related to anemia"  . History of kidney stones   . Hypercholesterolemia   . Hypertension   . Hypoparathyroidism ([place])   . Osteoporosis   . PAD (peripheral artery disease) ([place])   . Personal history of colonic polyps 05/01/2001   hyperplastic  . Spinal stenosis   . Vitamin D deficiency     Patient Active Problem List   Diagnosis Date Noted  . Community acquired pneumonia 03/27/2019  . Acute lower UTI 03/27/2019  . AKI (acute kidney injury) ([place]) 03/26/2019  . Acute cystitis without hematuria   . Weakness   . Constipation 11/21/2016  . Uremia 11/20/2016  . Impacted stool in rectum ([place]) 11/20/2016  . CKD (chronic kidney disease), stage IV ([place]) 11/20/2016  . PAD (peripheral artery disease) ([place]) 07/01/2016  . Abdominal aortic aneurysm without rupture ([place]) 04/18/2014  . PVD (peripheral vascular disease) ([place]) 03/20/2014  . Bilateral lower extremity edema 01/15/2013  . ARTHRITIS, KNEE 05/16/2009  . VULVAR ABSCESS 05/02/2009  . Normocytic anemia 08/09/2008  . FECAL OCCULT BLOOD 08/09/2008  .  VITAMIN D DEFICIENCY 08/08/2008  . HYPERLIPIDEMIA 08/08/2008  . Essential hypertension 08/08/2008  . FIBRILLATION, ATRIAL 08/08/2008  . CAROTID ARTERY DISEASE 08/08/2008  . GERD 08/08/2008  . HIATAL HERNIA 08/08/2008  . DIVERTICULOSIS, COLON 08/08/2008  . GASTROINTESTINAL HEMORRHAGE 08/08/2008  . RENAL DISEASE, CHRONIC 08/08/2008  . SPINAL STENOSIS 08/08/2008  . OSTEOPOROSIS 08/08/2008  . HYPERPARATHYROIDISM, HX OF 08/08/2008  . COLONIC POLYPS, HYPERPLASTIC, HX OF 08/08/2008  . RENAL CALCULUS, HX OF 08/08/2008    Past Surgical  History:  Procedure Laterality Date  . ABDOMINAL AORTAGRAM N/A 05/03/2014   Procedure: ABDOMINAL Maxcine Ham;  Surgeon: Elam Dutch, MD;  Location: [facility] CATH LAB;  Service: Cardiovascular;  Laterality: N/A;  . ABDOMINAL HYSTERECTOMY    . APPENDECTOMY    . CARDIOVERSION  01/04/2003  . carotid duplex  01/10/2012   less than 50% bilat disease  . CATARACT EXTRACTION, BILATERAL Bilateral   . COLONOSCOPY    . KIDNEY STONE SURGERY Bilateral    Removal of bilateral kidney stones; "they cut me open"  . PERIPHERAL VASCULAR CATHETERIZATION N/A 07/02/2016   Procedure: Abdominal Aortogram;  Surgeon: Elam Dutch, MD;  Location: [facility] CV LAB;  Service: Cardiovascular;  Laterality: N/A;  . PERIPHERAL VASCULAR CATHETERIZATION Bilateral 07/02/2016   Procedure: Lower Extremity Angiography;  Surgeon: Elam Dutch, MD;  Location: [facility] CV LAB;  Service: Cardiovascular;  Laterality: Bilateral;  LIMITED TO ILIACS  . PERIPHERAL VASCULAR CATHETERIZATION Right 07/02/2016   Procedure: Peripheral Vascular Intervention;  Surgeon: Elam Dutch, MD;  Location: [facility] CV LAB;  Service: Cardiovascular;  Laterality: Right;  COMMON AND EXTERNAL ILIACS  STENTS X 3  . POLYPECTOMY    . TRANSTHORACIC ECHOCARDIOGRAM  03/2004   EF normal; RA mod-severely dilated, LA mod dilated; mild MR, mild TR; aortic valve mildly sclerotic; mild pulm valve regurg     OB History   No obstetric history on file.      Home Medications    Prior to Admission medications   Medication Sig Start Date End Date Taking? Authorizing Provider  hydrochlorothiazide (MICROZIDE) 12.5 MG capsule Take 1 capsule (12.5 mg total) by mouth daily. 07/05/16   Alvia Grove, PA-C  IRON PO Take 1 tablet by mouth daily.    [provider]  metoprolol tartrate (LOPRESSOR) 25 MG tablet Take 25 mg by mouth 2 (two) times daily.  06/18/16   [provider]  polyethylene glycol (MIRALAX / GLYCOLAX) packet Take 17 g by mouth  daily. Patient taking differently: Take 17 g by mouth daily as needed for mild constipation.  11/22/16   Dessa Phi, DO    Family History Family History  Problem Relation Age of Onset  . Heart disease Mother   . Heart disease Father   . Colon cancer Neg Hx     Social History Social History   Tobacco Use  . Smoking status: Former Smoker    Packs/day: 0.12    Years: 65.00    Pack years: 7.80    Types: Cigarettes    Quit date: 08/01/2014    Years since quitting: 4.6  . Smokeless tobacco: Never Used  . Tobacco comment: "started smoking early in my ?2s"  Substance Use Topics  . Alcohol use: Yes    Alcohol/week: 4.0 standard drinks    Types: 4 Shots of liquor per week    Comment: 07/01/2016 "I drink boubon on the weekend"  . Drug use: No     Allergies   Patient has no known  allergies.   Review of Systems Review of Systems  Unable to perform ROS: Dementia (Level 5 caveat)     Physical Exam Updated Vital Signs BP (!) 160/88 (BP Location: Left Arm)   Pulse 92   Temp 98.1 F (36.7 C) (Oral)   Resp 18   Wt 45.4 kg   SpO2 99%   BMI 19.55 kg/m   Physical Exam Constitutional:      Comments: Thin, extremely frail, will smile at me but has difficulty speaking  HENT:     Head: Normocephalic and atraumatic.  Cardiovascular:     Rate and Rhythm: Normal rate.     Pulses: Normal pulses.  Pulmonary:     Effort: Pulmonary effort is normal.     Comments: Weak respiratory effort Diminished breath sounds in mid and lower lung fields Abdominal:     Palpations: Abdomen is soft.  Neurological:     Comments: Will verbalize, can lift arms off bed with effort      ED Treatments / Results  Labs (all labs ordered are listed, but only abnormal results are displayed) Labs Reviewed  COMPREHENSIVE METABOLIC PANEL - Abnormal; Notable for the following components:      Result Value   Sodium 134 (*)    CO2 18 (*)    BUN 71 (*)    Creatinine, Ser 6.92 (*)    Total Protein  6.1 (*)    Albumin 2.3 (*)    Total Bilirubin 1.4 (*)    GFR calc non Af Amer 5 (*)    GFR calc Af Amer 6 (*)    Anion gap 17 (*)    All other components within normal limits  CBC WITH DIFFERENTIAL/PLATELET - Abnormal; Notable for the following components:   WBC 14.3 (*)    RBC 3.33 (*)    Hemoglobin 9.1 (*)    HCT 26.0 (*)    MCV 78.1 (*)    Neutro Abs 13.0 (*)    Lymphs Abs 0.4 (*)    Abs Immature Granulocytes 0.08 (*)    All other components within normal limits  PROTIME-INR - Abnormal; Notable for the following components:   Prothrombin Time 16.1 (*)    INR 1.3 (*)    All other components within normal limits  BRAIN NATRIURETIC PEPTIDE - Abnormal; Notable for the following components:   B Natriuretic Peptide 3,263.9 (*)    All other components within normal limits  TROPONIN I (HIGH SENSITIVITY) - Abnormal; Notable for the following components:   Troponin I (High Sensitivity) 268 (*)    All other components within normal limits  TROPONIN I (HIGH SENSITIVITY) - Abnormal; Notable for the following components:   Troponin I (High Sensitivity) 206 (*)    All other components within normal limits  CULTURE, BLOOD (ROUTINE X 2)  LACTIC ACID, PLASMA  LACTIC ACID, PLASMA    EKG EKG Interpretation  Date/Time:  Monday April 09 2019 10:29:13 EST Ventricular Rate:  107 PR Interval:    QRS Duration: 95 QT Interval:  391 QTC Calculation: 522 R Axis:   -22 Text Interpretation: Atrial fibrillation Borderline left axis deviation Anterior infarct, age indeterminate Prolonged QT interval No STEMI Confirmed by Octaviano Glow [phone]) on 04/09/2019 10:45:26 AM   Radiology No results found.  Procedures Procedures (including critical care time)  Medications Ordered in ED Medications  0.9 %  sodium chloride infusion ( Intravenous Stopped 04/10/19 0700)     Initial Impression / Assessment and Plan / ED Course  I  have reviewed the triage vital signs and the nursing notes.   Pertinent labs & imaging results that were available during my care of the patient were reviewed by me and considered in my medical decision making (see chart for details).  I spoken to the patient's niece Mardene Celeste who is her POA about the patient's progressive decline over the past several months.  With her recurrent hospitalizations and infections, now refusal to eat or keep down fluids, she presents with failure to thrive and is a very poor prognosis.  At this time I believe it is reasonable for them to consider home hospice.  Per her labs today, the patient is again in acute renal failure.  This is likely related to her very poor p.o. intake at home.  Her family member (niece Mardene Celeste, who is PoA) would not like further invasive medical procedures at this time.  I explained that Eleora is likely very poor candidate for dialysis or other interventions given her comorbidities.  I explained I believe she is dying.  We will follow up with remainder of her labs and reach out to palliative care.  Clinical Course as of Apr 10 1225  Mon Apr 09, 2019  1724 CM and palliative team at bedside discussing home hospice options with Mardene Celeste.   [MT]  [id number] With multiple critical levels including elevated troponin, significant kidney injury, significantly elevated BNP, the patient is showing signs of multiorgan failure.  I explained this to her niece Mardene Celeste at the bedside.  Extremely poor prognosis, and I did recommend home hospice   [MT]  [place] to keep the patient emergency department overnight while home hospice is set up in the morning.  Comfort care measures only.  Her daughter Mardene Celeste is in agreement.   [MT]    Clinical Course User Index [MT] Wyvonnia Dusky, MD      Final Clinical Impressions(s) / ED Diagnoses   Final diagnoses:  Failure to thrive in adult  Renal failure, unspecified chronicity  Heart failure, unspecified HF chronicity, unspecified heart failure type [facility])  NSTEMI  (non-ST elevated myocardial infarction) [facility])    ED Discharge Orders    None       Wyvonnia Dusky, MD 04/11/19 1226

## 2019-04-09 NOTE — ED Notes (Addendum)
First troponin drawn at 1345, will wait to draw 2nd troponin at 1545 d/t this

## 2019-04-09 NOTE — Progress Notes (Signed)
CSW received consult for residential hospice placement, ED RN CM will assist with referral.  Madilyn Fireman, MSW, LCSW-A Transitions of Care  Clinical Social Worker  [facility] Emergency Departments  Medical ICU [phone]

## 2019-04-09 NOTE — ED Notes (Signed)
Pt O2 sat consistently between 88% and 91% on RA, 2L O2 applied, increased to 100%

## 2019-04-09 NOTE — Consult Note (Signed)
   Consultation Note Date: 04/09/2019   Patient Name: Pam [name]  DOB: 01/29/1926  MRN: [id number]  Age / Sex: 85 y.o., female   PCP: Moreira, Roy, MD Referring Physician: Trifan, Matthew J, MD   REASON FOR CONSULTATION:Establishing goals of care  Palliative Care consult requested for this 85 y.o. female with multiple medical problems including CKD stage IV-V (not on hemodialysis), atrial fibrillation (not on anticoagulation due to melena), diverticulosis, peripheral artery disease, hypertension, hypercholesterolemia, and spinal stenosis. She presented to ED with complaints of shortness of breath and weakness. Patient was recently discharged on 03/29/2019 after receiving treatment  (azithromycin and cefdinir) for possible UTI and pneumonia. ED work up showed BNP 3263.9, Troponin 268, WBC 14.3, albumin 2.3. Chest x-ray showed diffuse interstitial prominence and widespread haziness throughout lungs bilaterally. Suspicion of multilobar bilateral pneumonia. As noted, further discussions with niece, Pam and ED provider, Niece (POA) requesting for patient to return home with possible hospice support given her continued decline over the months. Palliative Medicine team consulted for goals of care.   Clinical Assessment and Goals of Care: I have reviewed medical records including lab results, imaging, Epic notes, and MAR, received report from the bedside RN, and assessed the patient. I met at the bedside with patient's niece/POA, Pam [name]  to discuss diagnosis prognosis, GOC, EOL wishes, disposition and options. Pam [name] lying in bed somewhat lethargic. Will open eyes occasionally. Appears weak and deconditioned. No acute distress noted.   I introduced Palliative Medicine as specialized medical care for people living with serious illness. It focuses on providing relief from the symptoms and stress of a serious illness. The goal is to improve quality of life for both the patient and the  family. Niece verbalized understanding and appreciation of our support.   We discussed a brief life review of the patient, along with her functional and nutritional status. 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 She is widowed and has no children. She is of Christian faith. Niece reports she enjoyed being amongst family and friends.   Prior to ED visit patient was living alone aproximately 2-3 months ago, however due to her health decline niece moved in to provide more continuous care. Niece reports prior to her moving in she would visit and support patient daily. She was independent of most ADLs, ambulatory with a walker, and had a good appetite. Pam reports over the past 6-8 weeks patient has declined rapidly. Her appetite has decreased, patient has began sleeping more than she is awake, and seems much weaker.   We discussed Her current illness and what it means in the larger context of Her on-going co-morbidities. With specific discussions regarding her worsening renal failure and overall functional and nutritional decline. Natural disease trajectory and expectations at EOL were discussed.   Pam verbalized understanding of her aunt's current state. She shares prior to ED visit today patient was complaining of some abdominal pain and constipation. She reports patient had a large incontinent episode and unfortunately shortly after she began vomiting with some evidence of blood. She shares after speaking with PCP was told to call 911 and come to ED. Niece tearful in expressing [name] would not want aggressive work-up or treatment.   Pam reports over the past week Pam [name] has been sleeping even more and when she was awake she was having discussions and stating she was seeing her deceased husband and also talking to her deceased parents. Pam   reports she has told her on multiple occassions she was dying and "leaving her earthly body to go to  heaven".   I attempted to elicit values and goals of care important to the patient.    The difference between aggressive medical intervention and comfort care was considered in light of the patient's goals of care. I educated family on what comfort care measures would look like. Pam confirms her wishes for her aunt is to get her back home which was her wishes to pass away in her home amongst family. Pam reports she and family have been in discussion and are prepared to provide care and take shifts to make sure to carry out Pam [name]'s wishes.  Pam request for all care to be comfort focus while she remains in the ED. She is not interesting in hospital admission and is hopeful she can discharge home with hospice support.   We discussed hospice outpatient services. Family verbalized their understanding and awareness of hospice's goals and philosophy of care. Pam is requesting home with hospice with needed equipment (hospital bed, oxygen, and comfort medications). She is tearful in hoping her aunt will have a smooth transition and is grateful her last moments does not have to be in the hospital.   Pam has been in discussion with the case management team regarding setting up home hospice. Given the lateness of the day hospice will not be able to deliver or admit patient today but with plans to begin the admission/approval process tomorrow. Pam expressing concerns of taking her aunt back home without their support and the needed equipment. I spoke with the EDP, Wanda, CM, and medical team. It has been determined for the safety and comfort of patient she will remain overnight in the ED with plans of transferring home tomorrow once hospice has completed the necessary process of admission and equipment delivery. Pam is aware and verbalized her appreciation and relief.   Questions and concerns were addressed.  Hard Choices booklet left for review. The family was encouraged to call  with questions or concerns.  PMT will continue to support holistically.   SOCIAL HISTORY:     reports that she quit smoking about 4 years ago. Her smoking use included cigarettes. She has a 7.80 pack-year smoking history. She has never used smokeless tobacco. She reports current alcohol use of about 4.0 standard drinks of alcohol per week. She reports that she does not use drugs.  CODE STATUS: DNR  ADVANCE DIRECTIVES: Pam [name] (niece/POA)   SYMPTOM MANAGEMENT: per EDP   Palliative Prophylaxis:   Aspiration, Bowel Regimen, Delirium Protocol, Frequent Pain Assessment and Oral Care  PSYCHO-SOCIAL/SPIRITUAL:  Support System: Family   Desire for further Chaplaincy support:NO   Additional Recommendations (Limitations, Scope, Preferences):  Full Comfort Care   PAST MEDICAL HISTORY: Past Medical History:  Diagnosis Date  . Anemia   . Ankle edema   . Atrial fibrillation (HCC)   . CAD (coronary artery disease)   . Carotid bruit    bilateral  . Chronic kidney disease   . Diverticulosis   . GERD (gastroesophageal reflux disease)   . History of blood transfusion    "probably related to anemia"  . History of kidney stones   . Hypercholesterolemia   . Hypertension   . Hypoparathyroidism (HCC)   . Osteoporosis   . PAD (peripheral artery disease) (HCC)   . Personal history of colonic polyps 05/01/2001   hyperplastic  . Spinal stenosis   . Vitamin D deficiency       PAST SURGICAL HISTORY:  Past Surgical History:  Procedure Laterality Date  . ABDOMINAL AORTAGRAM N/A 05/03/2014   Procedure: ABDOMINAL AORTAGRAM;  Surgeon: Charles E Fields, MD;  Location: [facility] CATH LAB;  Service: Cardiovascular;  Laterality: N/A;  . ABDOMINAL HYSTERECTOMY    . APPENDECTOMY    . CARDIOVERSION  01/04/2003  . carotid duplex  01/10/2012   less than 50% bilat disease  . CATARACT EXTRACTION, BILATERAL Bilateral   . COLONOSCOPY    . KIDNEY STONE SURGERY Bilateral    Removal of bilateral kidney  stones; "they cut me open"  . PERIPHERAL VASCULAR CATHETERIZATION N/A 07/02/2016   Procedure: Abdominal Aortogram;  Surgeon: Charles E Fields, MD;  Location: [facility] INVASIVE CV LAB;  Service: Cardiovascular;  Laterality: N/A;  . PERIPHERAL VASCULAR CATHETERIZATION Bilateral 07/02/2016   Procedure: Lower Extremity Angiography;  Surgeon: Charles E Fields, MD;  Location: [facility] INVASIVE CV LAB;  Service: Cardiovascular;  Laterality: Bilateral;  LIMITED TO ILIACS  . PERIPHERAL VASCULAR CATHETERIZATION Right 07/02/2016   Procedure: Peripheral Vascular Intervention;  Surgeon: Charles E Fields, MD;  Location: [facility] INVASIVE CV LAB;  Service: Cardiovascular;  Laterality: Right;  COMMON AND EXTERNAL ILIACS  STENTS X 3  . POLYPECTOMY    . TRANSTHORACIC ECHOCARDIOGRAM  03/2004   EF normal; RA mod-severely dilated, LA mod dilated; mild MR, mild TR; aortic valve mildly sclerotic; mild pulm valve regurg    ALLERGIES:  has No Known Allergies.   MEDICATIONS:  No current facility-administered medications for this encounter.    Current Outpatient Medications  Medication Sig Dispense Refill  . hydrochlorothiazide (MICROZIDE) 12.5 MG capsule Take 1 capsule (12.5 mg total) by mouth daily.    . IRON PO Take 1 tablet by mouth daily.    . metoprolol tartrate (LOPRESSOR) 25 MG tablet Take 25 mg by mouth 2 (two) times daily.     . polyethylene glycol (MIRALAX / GLYCOLAX) packet Take 17 g by mouth daily. (Patient taking differently: Take 17 g by mouth daily as needed for mild constipation. ) 14 each 0    VITAL SIGNS: BP (!) 167/102   Pulse 100   Temp 99.7 F (37.6 C) (Rectal)   Resp 17   Wt 45.4 kg   SpO2 100%   BMI 19.55 kg/m  Filed Weights   04/09/19 1029  Weight: 45.4 kg    Estimated body mass index is 19.55 kg/m as calculated from the following:   Height as of 03/26/19: 5' (1.524 m).   Weight as of this encounter: 45.4 kg.  LABS: CBC:    Component Value Date/Time   WBC 14.3 (H) 04/09/2019 1031   HGB 9.1  (L) 04/09/2019 1031   HGB 11.6 06/17/2014 1105   HCT 26.0 (L) 04/09/2019 1031   HCT 34.4 (L) 06/17/2014 1105   PLT 231 04/09/2019 1031   PLT 145 06/17/2014 1105   Comprehensive Metabolic Panel:    Component Value Date/Time   NA 134 (L) 04/09/2019 1031   NA 140 11/06/2012 1110   K 4.1 04/09/2019 1031   K 4.4 11/06/2012 1110   CO2 18 (L) 04/09/2019 1031   CO2 23 11/06/2012 1110   BUN 71 (H) 04/09/2019 1031   BUN 33.7 (H) 11/06/2012 1110   CREATININE 6.92 (H) 04/09/2019 1031   CREATININE 1.8 (H) 11/06/2012 1110   ALBUMIN 2.3 (L) 04/09/2019 1031   ALBUMIN 3.4 (L) 11/06/2012 1110     Review of Systems  Unable to perform ROS: Acuity of condition   Physical Exam General: NAD,   frail chronically-ill appearing,  Cardiovascular: regular rate and rhythm Pulmonary:diminished bilaterally, 2L/Abrams  Abdomen: soft, nontender, + bowel sounds Extremities: no edema, no joint deformities Skin: no rashes, thin  Neurological: lethargic, will not follow commands, will open eyes and attempt to mumble   Prognosis: Days-Weeks (2 weeks or less) in the setting of full comfort measures in the setting of worsening renal failure (declining work-up or HD), atrial fibrillaion, PAD, hypertension, spinal stenosis, pneumonia, protein calorie malnutrition, poor po intake, generalized weakness, dehydration, BNP 3263.9, troponin 268, albumin 2.3  Discharge Planning:  Home with Hospice  Recommendations:  DNR/DNI-as confirmed by family  Comfort care while holding in ED with goal of discharging home tomorrow with hospice support for EOL care.   Case Manager referral -spoke with Wanda who is arranging for [facility] and home equipment (not available most likely until tomorrow)  No aggressive work-up or interventions (labs, radiology testing, medications, tele ) Medications for comfort only.    Palliative Performance Scale:PPS 10-20%               Pam (niece/POA) expressed understanding and was in  agreement with this plan.   Thank you for allowing the Palliative Medicine Team to assist in the care of this patient.  Time In: 1740 Time Out: 1835 Time Total: 55 min.   Visit consisted of counseling and education dealing with the complex and emotionally intense issues of symptom management and palliative care in the setting of serious and potentially life-threatening illness.Greater than 50%  of this time was spent counseling and coordinating care related to the above assessment and plan.  Signed by:  Nikki Pickenpack-Cousar, AGPCNP-BC Palliative Medicine Team  Phone: [phone] Fax: [phone] Pager: [phone] Amion: N. Cousar    

## 2019-04-09 NOTE — Care Management (Signed)
ED CM met with patient and niece POA Evangeline Dakin ,  Discussed recommendations for hospice services, and explained the different supportive services, available.POA agreeable with home hospice services. Offered choice CMS list provided, selected [facility].for Home Hospice services. CM provided patient with [facility] Pamphlet.  Nikki Palliative NP called POA and will come down to speak with family concerning  Goals of care.  Referral was called and faxed in to [facility]. Via [facility]. Patient will need equipment before discharge home with Hospice. Daytime CM will folow up in the am. Updated Dr. Eulis Foster EDP

## 2019-04-10 ENCOUNTER — Other Ambulatory Visit: Payer: Self-pay

## 2019-04-10 MED ORDER — MORPHINE SULFATE (CONCENTRATE) 10 MG/0.5ML PO SOLN: 10.0000 mg | ORAL | Status: DC | PRN

## 2019-04-10 NOTE — Progress Notes (Signed)
PALLIATIVE NOTE:  Patient assessed. She is somewhat more awake today. Able to follow some commands. Alert to name. Denies pain states "I am weak and dying!" Continues to have little to no appetite or interest in po nutrition. Breakfast tray at bedside untouched. Offered drink or bites from tray and she declined and closed eyes. No family at the bedside.   Patricia (niece/POA) home preparing for patient's discharge. Venia Carbon, RN [facility] Liaison) reviewing and arranging for home equipment etc.   DNR completed and given to Secretary to scan in chart and original given to Wells Guiles, RN to be sent home at discharge.   Continue with comfort measures while holding in ED.   -Patient will need RX for Roxanol at discharge   Total Time: 20 min.   Greater than 50%  of this time was spent counseling and coordinating care related to the above assessment and plan.  Pam [name], AGPCNP-BC Palliative Medicine Team  Phone: [phone]

## 2019-04-10 NOTE — ED Notes (Signed)
D/C instructions given to [facility] along w/pt's yellow DNR form.

## 2019-04-10 NOTE — Discharge Planning (Signed)
EDCM called [facility] to confirm receipt of referral. Will update care team with discharge information as it comes available.

## 2019-04-10 NOTE — Discharge Planning (Signed)
Kagan Mutchler J. Clydene Laming, RN, BSN, [employer] [phone] Spoke with pt at bedside regarding discharge planning for [facility]. EDCM contacted [facility] to provide PT/OT/SW services and they accepted.

## 2019-04-10 NOTE — Care Management (Signed)
ED CM called the patient's niece Deliah Boston. She states the patient's equipment is being delivered now. Her oxygen was just set up in the home per [place]. Patient's nurse notified.

## 2019-04-10 NOTE — Discharge Planning (Signed)
EDCM spoke with Pam [name], liaison for [facility].  Anderson Malta will contact family to arrange home hospice needs and update me with discharge information.

## 2019-04-10 NOTE — ED Notes (Addendum)
Completed Yellow DNR form placed in envelope to be given to [facility] when pt is d/c'd.

## 2019-04-10 NOTE — Progress Notes (Signed)
CSW arranged for transportation home via [facility].  Madilyn Fireman, MSW, LCSW-A Transitions of Care  Clinical Social Worker  [facility]  Medical ICU [phone]

## 2019-04-10 NOTE — Progress Notes (Addendum)
[facility])  Referral received from [facility] for hospice services at home.  Spoke with NOK and niece [name], confirmed interest, explained services and answered questions.  DME discussed, will order hospital bed, oxygen, wheelchair and BSC, pt will need all delivered prior to discharging home.  Please send completed DNR home with pt and arrange for any prescriptions that may be needed prior to [facility].  Thank you, Venia Carbon RN, BSN, [facility] Liaison (in [place]) [phone]  **Ms. Suzan Garibaldi will call the ED to let them know DME is set up and pt can be sent home  **approved for hospice services at home per Dr. Tomasa Hosteller with [facility]

## 2019-04-10 NOTE — ED Notes (Signed)
Encouraged pt to eat breakfast - declined. Pt drank juice.

## 2019-04-10 NOTE — ED Notes (Signed)
Breakfast ordered 

## 2019-04-14 LAB — CULTURE, BLOOD (ROUTINE X 2)
Culture: NO GROWTH
Special Requests: ADEQUATE

## 2019-05-01 DEATH — deceased
